# Patient Record
Sex: Female | Born: 1985 | Hispanic: Yes | Marital: Married | Smoking: Never smoker
Health system: Southern US, Community
[De-identification: ages and names within clinical notes are randomized; demographics above are authoritative.]

## PROBLEM LIST (undated history)

## (undated) DIAGNOSIS — R51 Headache: Secondary | ICD-10-CM

## (undated) DIAGNOSIS — R519 Headache, unspecified: Secondary | ICD-10-CM

## (undated) HISTORY — DX: Headache, unspecified: R51.9

## (undated) HISTORY — PX: KNEE SURGERY: SHX244

## (undated) HISTORY — PX: APPENDECTOMY: SHX54

## (undated) HISTORY — DX: Headache: R51

---
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
## 2011-04-13 ENCOUNTER — Ambulatory Visit: Payer: Self-pay | Admitting: Internal Medicine

## 2015-03-31 ENCOUNTER — Emergency Department: Payer: BLUE CROSS/BLUE SHIELD

## 2015-03-31 ENCOUNTER — Encounter: Payer: Self-pay | Admitting: Emergency Medicine

## 2015-03-31 ENCOUNTER — Emergency Department
Admission: EM | Admit: 2015-03-31 | Discharge: 2015-03-31 | Disposition: A | Discharge status: Home / Self Care | Payer: BLUE CROSS/BLUE SHIELD | Attending: Emergency Medicine | Admitting: Emergency Medicine

## 2015-03-31 DIAGNOSIS — M791 Myalgia: Secondary | ICD-10-CM | POA: Insufficient documentation

## 2015-03-31 DIAGNOSIS — M545 Low back pain, unspecified: Secondary | ICD-10-CM

## 2015-03-31 MED ORDER — IBUPROFEN 800 MG PO TABS: 800.0000 mg | ORAL_TABLET | Freq: Three times a day (TID) | ORAL | Status: DC | PRN

## 2015-03-31 MED ORDER — METHOCARBAMOL 500 MG PO TABS
500.0000 mg | ORAL_TABLET | Freq: Once | ORAL | Status: AC
  Administered 2015-03-31: 500 mg via ORAL

## 2015-03-31 MED ORDER — METHOCARBAMOL 500 MG PO TABS
ORAL_TABLET | ORAL | Status: AC
  Administered 2015-03-31: 500 mg via ORAL
  Filled 2015-03-31: qty 1

## 2015-03-31 MED ORDER — OXYCODONE-ACETAMINOPHEN 5-325 MG PO TABS: 2.0000 | ORAL_TABLET | Freq: Once | ORAL | Status: DC

## 2015-03-31 MED ORDER — CYCLOBENZAPRINE HCL 10 MG PO TABS: 10.0000 mg | ORAL_TABLET | Freq: Three times a day (TID) | ORAL | Status: DC | PRN

## 2015-03-31 MED ORDER — KETOROLAC TROMETHAMINE 60 MG/2ML IM SOLN
INTRAMUSCULAR | Status: AC
  Administered 2015-03-31: 60 mg
  Filled 2015-03-31: qty 2

## 2015-03-31 MED ORDER — HYDROCODONE-ACETAMINOPHEN 5-325 MG PO TABS: 1.0000 | ORAL_TABLET | ORAL | Status: DC | PRN

## 2015-03-31 MED ORDER — KETOROLAC TROMETHAMINE 30 MG/ML IJ SOLN: 60.0000 mg | Freq: Once | INTRAMUSCULAR | Status: DC

## 2015-03-31 NOTE — ED Notes (Signed)
Pt presents with back pain for more than one year. [facility]. Pt reports pain has been constant since this past Friday.

## 2015-03-31 NOTE — ED Provider Notes (Signed)
[facility] Emergency Department Provider Note  ____________________________________________  Time seen: Approximately 2:13 PM  I have reviewed the triage vital signs and the nursing notes.   HISTORY  Chief Complaint Back Pain    HPI Andrea Ellis is a 29 y.o. female presents with low back pain for more than a year. However she had acute exacerbation last night with increased pain to the right side of her buttocks and lumbar spine.   History reviewed. No pertinent past medical history.  There are no active problems to display for this patient.   Past Surgical History  Procedure Laterality Date  . Knee surgery      Current Outpatient Rx  Name  Route  Sig  Dispense  Refill  . cyclobenzaprine (FLEXERIL) 10 MG tablet   Oral   Take 1 tablet (10 mg total) by mouth every 8 (eight) hours as needed for muscle spasms.   30 tablet   1   . HYDROcodone-acetaminophen (NORCO) 5-325 MG per tablet   Oral   Take 1-2 tablets by mouth every 4 (four) hours as needed for moderate pain.   15 tablet   0   . ibuprofen (ADVIL,MOTRIN) 800 MG tablet   Oral   Take 1 tablet (800 mg total) by mouth every 8 (eight) hours as needed.   30 tablet   0     Allergies Review of patient's allergies indicates no known allergies.  No family history on file.  Social History History  Substance Use Topics  . Smoking status: Never Smoker   . Smokeless tobacco: Not on file  . Alcohol Use: No    Review of Systems Constitutional: No fever/chills Eyes: No visual changes. ENT: No sore throat. Cardiovascular: Denies chest pain. Respiratory: Denies shortness of breath. Gastrointestinal: No abdominal pain.  No nausea, no vomiting.  No diarrhea.  No constipation. Genitourinary: Negative for dysuria. Musculoskeletal: Positive for lumbar pain. Skin: Negative for rash. Neurological: Negative for headaches, focal weakness or numbness.  10-point ROS otherwise  negative.  ____________________________________________   PHYSICAL EXAM:  VITAL SIGNS: ED Triage Vitals  Enc Vitals Group     BP 03/31/15 1302 128/95 mmHg     Pulse Rate 03/31/15 1302 79     Resp 03/31/15 1302 18     Temp 03/31/15 1302 98.2 F (36.8 C)     Temp Source 03/31/15 1302 Oral     SpO2 03/31/15 1302 100 %     Weight 03/31/15 1302 192 lb (87.091 kg)     Height 03/31/15 1302 5\' 3"  (1.6 m)     Head Cir --      Peak Flow --      Pain Score 03/31/15 1303 8     Pain Loc --      Pain Edu? --      Excl. in GC? --     Constitutional: Alert and oriented. Well appearing and in no acute distress. Eyes: Conjunctivae are normal. PERRL. EOMI. Head: Atraumatic. Nose: No congestion/rhinnorhea. Mouth/Throat: Mucous membranes are moist.  Oropharynx non-erythematous. Neck: No stridor.   Cardiovascular: Normal rate, regular rhythm. Grossly normal heart sounds.  Good peripheral circulation. Respiratory: Normal respiratory effort.  No retractions. Lungs CTAB. Gastrointestinal: Soft and nontender. No distention. No abdominal bruits. No CVA tenderness. Musculoskeletal: No lower extremity tenderness nor edema.  No joint effusions. Straight leg raise negative bilaterally Neurologic:  Normal speech and language. No gross focal neurologic deficits are appreciated. Speech is normal. No gait instability. Skin:  Skin  is warm, dry and intact. No rash noted. Psychiatric: Mood and affect are normal. Speech and behavior are normal.  ____________________________________________   LABS (all labs ordered are listed, but only abnormal results are displayed)  Labs Reviewed - No data to display ____________________________________________  EKG  Deferred ____________________________________________  RADIOLOGY  Interpreted by radiologist, reviewed by myself. Negative. ____________________________________________   PROCEDURES  Procedure(s) performed: None  Critical Care performed:  No  ____________________________________________   INITIAL IMPRESSION / ASSESSMENT AND PLAN / ED COURSE  Pertinent labs & imaging results that were available during my care of the patient were reviewed by me and considered in my medical decision making (see chart for details).  Chronic low back pain. We'll refer to local PCP. Rx given for Flexeril Motrin and 2 day course of hydrocodone. ____________________________________________   FINAL CLINICAL IMPRESSION(S) / ED DIAGNOSES  Final diagnoses:  Bilateral low back pain without sciatica      Evangeline Dakin, PA-C 03/31/15 1626  Jene Every, MD 04/04/15 1539

## 2015-03-31 NOTE — Discharge Instructions (Signed)
Dolor De Espalda Crnico (Chronic Back Pain)  Cuando el dolor en la espalda dura ms de 3 meses, se denomina dolor de espalda crnico. Las personas que sufren dolor de espalda crnico generalmente pasan por perodos en los que es ms intenso (brotes).  CAUSAS  El dolor de espalda crnico puede estar originado en el desgaste degeneracin) de las diferentes estructuras de la espalda. Estas estructuras incluyen:  Los huesos de la columna vertebral (vrtebras) y las articulaciones rodean la mdula espinal y las races nerviosas (facetas).  Hay un tejido fibroso y fuerte que conecta las vrtebras (ligamentos). La degeneracin de estas estructuras provoca presin Jones Apparel Groupsobre los nervios. Esto puede causar Engineer, manufacturing systemsun dolor permanente.  INSTRUCCIONES PARA EL CUIDADO EN EL HOGAR   Evite encorvarse, levantar mucho peso, permanecer sentado por Con-waymucho tiempo, y las actividades que puedan empeorar el problema.  Tome breves perodos de descanso a travs del da para reducir Chief Technology Officerel dolor durante los brotes. Recostarse o Personal assistantpermanecer de pie generalmente es mejor que permanecer sentado para Lawyerdescansar.  Tome slo medicamentos de venta libre o recetados, segn las indicaciones del mdico. SOLICITE ATENCIN MDICA DE INMEDIATO SI:  Siente debilidad intensa o adormecimiento en una de sus piernas o pies.  Tiene dificultad para controlar la vejiga o el intestino.  Presenta nuseas, vmitos, dolor abdominal, falta de aire o desmayos. Document Released: 10/17/2005 Document Revised: 01/09/2012 [facility] Patient Information 2015 [place], [place]. This information is not intended to replace advice given to you by your health care provider. Make sure you discuss any questions you have with your health care provider.

## 2015-04-28 ENCOUNTER — Ambulatory Visit (INDEPENDENT_AMBULATORY_CARE_PROVIDER_SITE_OTHER): Payer: BLUE CROSS/BLUE SHIELD | Admitting: Family Medicine

## 2015-04-28 ENCOUNTER — Encounter: Payer: Self-pay | Admitting: Family Medicine

## 2015-04-28 VITALS — BP 115/77 | HR 62 | Temp 98.2°F | Ht 63.5 in | Wt 189.6 lb

## 2015-04-28 DIAGNOSIS — M5441 Lumbago with sciatica, right side: Secondary | ICD-10-CM

## 2015-04-28 DIAGNOSIS — M545 Low back pain: Secondary | ICD-10-CM | POA: Insufficient documentation

## 2015-04-28 DIAGNOSIS — G8929 Other chronic pain: Secondary | ICD-10-CM | POA: Insufficient documentation

## 2015-04-28 NOTE — Assessment & Plan Note (Signed)
Discussed results of the x-ray with patient. No sign of DJD. Neuro exam is normal. No need for MRI at this time. Has plenty of muscle relaxer at home. Will take it at night. Will continue NSAID. Will start PT. Referral to [facility] generated today and given to patient who will call for an appointment. Exercises and information about low back pain given to patient today. Follow up in 2 weeks to see how she is doing.

## 2015-04-28 NOTE — Patient Instructions (Signed)
Back Pain, Adult Back pain is very common. The pain often gets better over time. The cause of back pain is usually not dangerous. Most people can learn to manage their back pain on their own.  HOME CARE   Stay active. Start with short walks on flat ground if you can. Try to walk farther each day.  Do not sit, drive, or stand in one place for more than 30 minutes. Do not stay in bed.  Do not avoid exercise or work. Activity can help your back heal faster.  Be careful when you bend or lift an object. Bend at your knees, keep the object close to you, and do not twist.  Sleep on a firm mattress. Lie on your side, and bend your knees. If you lie on your back, put a pillow under your knees.  Only take medicines as told by your doctor.  Put ice on the injured area.  Put ice in a plastic bag.  Place a towel between your skin and the bag.  Leave the ice on for 15-20 minutes, 03-04 times a day for the first 2 to 3 days. After that, you can switch between ice and heat packs.  Ask your doctor about back exercises or massage.  Avoid feeling anxious or stressed. Find good ways to deal with stress, such as exercise. GET HELP RIGHT AWAY IF:   Your pain does not go away with rest or medicine.  Your pain does not go away in 1 week.  You have new problems.  You do not feel well.  The pain spreads into your legs.  You cannot control when you poop (bowel movement) or pee (urinate).  Your arms or legs feel weak or lose feeling (numbness).  You feel sick to your stomach (nauseous) or throw up (vomit).  You have belly (abdominal) pain.  You feel like you may pass out (faint). MAKE SURE YOU:   Understand these instructions.  Will watch your condition.  Will get help right away if you are not doing well or get worse. Document Released: 04/04/2008 Document Revised: 01/09/2012 Document Reviewed: 02/18/2014 [facility] Patient Information 2015 [place], [place]. This information is not intended  to replace advice given to you by your health care provider. Make sure you discuss any questions you have with your health care provider. Back Exercises Back exercises help treat and prevent back injuries. The goal is to increase your strength in your belly (abdominal) and back muscles. These exercises can also help with flexibility. Start these exercises when told by your doctor. HOME CARE Back exercises include: Pelvic Tilt.  Lie on your back with your knees bent. Tilt your pelvis until the lower part of your back is against the floor. Hold this position 5 to 10 sec. Repeat this exercise 5 to 10 times. Knee to Chest.  Pull 1 knee up against your chest and hold for 20 to 30 seconds. Repeat this with the other knee. This may be done with the other leg straight or bent, whichever feels better. Then, pull both knees up against your chest. Sit-Ups or Curl-Ups.  Bend your knees 90 degrees. Start with tilting your pelvis, and do a partial, slow sit-up. Only lift your upper half 30 to 45 degrees off the floor. Take at least 2 to 3 seonds for each sit-up. Do not do sit-ups with your knees out straight. If partial sit-ups are difficult, simply do the above but with only tightening your belly (abdominal) muscles and holding it as told.  Hip-Lift.  Lie on your back with your knees flexed 90 degrees. Push down with your feet and shoulders as you raise your hips 2 inches off the floor. Hold for 10 seconds, repeat 5 to 10 times. Back Arches.  Lie on your stomach. Prop yourself up on bent elbows. Slowly press on your hands, causing an arch in your low back. Repeat 3 to 5 times. Shoulder-Lifts.  Lie face down with arms beside your body. Keep hips and belly pressed to floor as you slowly lift your head and shoulders off the floor. Do not overdo your exercises. Be careful in the beginning. Exercises may cause you some mild back discomfort. If the pain lasts for more than 15 minutes, stop the exercises until you  see your doctor. Improvement with exercise for back problems is slow.  Document Released: 11/19/2010 Document Revised: 01/09/2012 Document Reviewed: 08/18/2011 [facility] Patient Information 2015 [place], [place]. This information is not intended to replace advice given to you by your health care provider. Make sure you discuss any questions you have with your health care provider. Back Exercises Back exercises help treat and prevent back injuries. The goal is to increase your strength in your belly (abdominal) and back muscles. These exercises can also help with flexibility. Start these exercises when told by your doctor. HOME CARE Back exercises include: Pelvic Tilt.  Lie on your back with your knees bent. Tilt your pelvis until the lower part of your back is against the floor. Hold this position 5 to 10 sec. Repeat this exercise 5 to 10 times. Knee to Chest.  Pull 1 knee up against your chest and hold for 20 to 30 seconds. Repeat this with the other knee. This may be done with the other leg straight or bent, whichever feels better. Then, pull both knees up against your chest. Sit-Ups or Curl-Ups.  Bend your knees 90 degrees. Start with tilting your pelvis, and do a partial, slow sit-up. Only lift your upper half 30 to 45 degrees off the floor. Take at least 2 to 3 seonds for each sit-up. Do not do sit-ups with your knees out straight. If partial sit-ups are difficult, simply do the above but with only tightening your belly (abdominal) muscles and holding it as told. Hip-Lift.  Lie on your back with your knees flexed 90 degrees. Push down with your feet and shoulders as you raise your hips 2 inches off the floor. Hold for 10 seconds, repeat 5 to 10 times. Back Arches.  Lie on your stomach. Prop yourself up on bent elbows. Slowly press on your hands, causing an arch in your low back. Repeat 3 to 5 times. Shoulder-Lifts.  Lie face down with arms beside your body. Keep hips and belly pressed to  floor as you slowly lift your head and shoulders off the floor. Do not overdo your exercises. Be careful in the beginning. Exercises may cause you some mild back discomfort. If the pain lasts for more than 15 minutes, stop the exercises until you see your doctor. Improvement with exercise for back problems is slow.  Document Released: 11/19/2010 Document Revised: 01/09/2012 Document Reviewed: 08/18/2011 [facility] Patient Information 2015 [place], [place]. This information is not intended to replace advice given to you by your health care provider. Make sure you discuss any questions you have with your health care provider.

## 2015-04-28 NOTE — Progress Notes (Signed)
BP 115/77 mmHg  Pulse 62  Temp(Src) 98.2 F (36.8 C) (Oral)  Ht 5' 3.5" (1.613 m)  Wt 189 lb 9.6 oz (86.002 kg)  BMI 33.06 kg/m2  SpO2 99%  LMP 04/07/2015   Subjective:    Patient ID: Andrea Ellis, female    DOB: [birth year], 29 y.o.   MRN: [id number]  HPI: Andrea Ellis is a 29 y.o. female here today to establish care.   Chief Complaint  Patient presents with  . Back Pain    Need a MRI per ER. Need primary care doctor. Pain in right side lower back and leg pain   BACK PAIN- Has seen [facility] previously for her back but the pain started to get really bad, so about a month ago she went to the ER. 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 She has never done any PT in the past and her exercises are all self-taught.  Duration: 2 years Mechanism of injury: no trauma Location: Right and low back Onset: gradual Severity: 6/10 Quality: sharp and shooting Frequency: intermittent Radiation: R leg below the knee Aggravating factors: bending Alleviating factors: nothing Status: stable Treatments attempted: rest, ice, heat, APAP, ibuprofen, aleve and HEP  Relief with NSAIDs?: mild Nighttime pain:  yes Paresthesias / decreased sensation:  no Bowel / bladder incontinence:  no Fevers:  no Dysuria / urinary frequency:  no  Relevant past medical, surgical, family and social history reviewed and updated as indicated. Interim medical history since our last visit reviewed. Allergies and medications reviewed and updated.  Review of Systems  Constitutional: Negative.   Respiratory: Negative.   Cardiovascular: Negative.   Gastrointestinal: Negative.   Musculoskeletal: Positive for myalgias and back pain. Negative for arthralgias, gait problem, neck pain and neck stiffness.  Psychiatric/Behavioral: Negative.    Per HPI unless specifically indicated above     Objective:    BP 115/77 mmHg  Pulse 62  Temp(Src) 98.2 F  (36.8 C) (Oral)  Ht 5' 3.5" (1.613 m)  Wt 189 lb 9.6 oz (86.002 kg)  BMI 33.06 kg/m2  SpO2 99%  LMP 04/07/2015  Wt Readings from Last 3 Encounters:  04/28/15 189 lb 9.6 oz (86.002 kg)  03/31/15 192 lb (87.091 kg)    Physical Exam  Constitutional: She is oriented to person, place, and time. She appears well-developed and well-nourished. No distress.  HENT:  Head: Normocephalic and atraumatic.  Right Ear: Hearing normal.  Left Ear: Hearing normal.  Nose: Nose normal.  Eyes: Conjunctivae and lids are normal. Right eye exhibits no discharge. Left eye exhibits no discharge. No scleral icterus.  Cardiovascular: Normal rate, regular rhythm, normal heart sounds and intact distal pulses.  Exam reveals no gallop and no friction rub.   No murmur heard. Pulmonary/Chest: Effort normal and breath sounds normal. No respiratory distress. She has no wheezes. She has no rales. She exhibits no tenderness.  Abdominal: Soft.  Neurological: She is alert and oriented to person, place, and time. She displays normal reflexes. She exhibits normal muscle tone. Coordination normal.  Skin: Skin is warm, dry and intact. No rash noted. No erythema. No pallor.  Psychiatric: She has a normal mood and affect. Her speech is normal and behavior is normal. Judgment and thought content normal. Cognition and memory are normal.    Back Exam:    Inspection:  Normal spinal curvature.  No deformity, ecchymosis, erythema, or lesions hypertonic paraspinals on the R L1-5  Palpation:     Midline spinal tenderness: no      Paralumbar tenderness: yesRight     Parathoracic tenderness: yesRight     Buttocks tenderness: yesRight     Range of Motion:      Flexion: Fingers to Knees     Extension:Decreased     Lateral bending:Decreased    Rotation:Decreased    Neuro Exam:Lower extremity DTRs normal & symmetric.  Strength and sensation intact.    Special Tests:      Straight leg raise:negative     Assessment & Plan:    Problem List Items Addressed This Visit    None       Follow up plan: No Follow-up on file.

## 2015-05-12 ENCOUNTER — Ambulatory Visit (INDEPENDENT_AMBULATORY_CARE_PROVIDER_SITE_OTHER): Payer: BLUE CROSS/BLUE SHIELD | Admitting: Family Medicine

## 2015-05-12 ENCOUNTER — Encounter: Payer: Self-pay | Admitting: Family Medicine

## 2015-05-12 VITALS — BP 110/75 | HR 61 | Temp 98.5°F | Wt 195.7 lb

## 2015-05-12 DIAGNOSIS — M5441 Lumbago with sciatica, right side: Secondary | ICD-10-CM | POA: Diagnosis not present

## 2015-05-12 MED ORDER — CYCLOBENZAPRINE HCL 10 MG PO TABS: 10.0000 mg | ORAL_TABLET | Freq: Three times a day (TID) | ORAL | Status: DC | PRN

## 2015-05-12 NOTE — Progress Notes (Signed)
BP 110/75 mmHg  Pulse 61  Temp(Src) 98.5 F (36.9 C)  Wt 195 lb 11.2 oz (88.769 kg)  SpO2 99%  LMP 04/07/2015 (Approximate)   Subjective:    Patient ID: Andrea Ellis, female    DOB: [birth year], 29 y.o.   MRN: [id number]  HPI: Andrea Ellis is a 29 y.o. female  Chief Complaint  Patient presents with  . Back Pain    patinet has went to Physical therapy 3 times, she states that it has not helped at all. She would like to know if she needs to continue going there or not   BACK PAIN Duration: 2 years Mechanism of injury: no trauma Location: Right and low back Onset: gradual Severity: moderate Quality: aching and tender Frequency: constant Radiation: none Aggravating factors: lifting, movement, walking, laying and bending Alleviating factors: nothing Status: stable Treatments attempted: rest, ice, heat, APAP, ibuprofen, aleve, physical therapy and HEP  Relief with NSAIDs?: moderate Nighttime pain:  no Paresthesias / decreased sensation:  no Bowel / bladder incontinence:  no Fevers:  no Dysuria / urinary frequency:  no  Relevant past medical, surgical, family and social history reviewed and updated as indicated. Interim medical history since our last visit reviewed. Allergies and medications reviewed and updated.  Review of Systems  Constitutional: Negative.   Respiratory: Negative.   Cardiovascular: Negative.   Gastrointestinal: Negative.   Genitourinary: Negative.   Musculoskeletal: Negative.   Psychiatric/Behavioral: Negative.     Per HPI unless specifically indicated above     Objective:    BP 110/75 mmHg  Pulse 61  Temp(Src) 98.5 F (36.9 C)  Wt 195 lb 11.2 oz (88.769 kg)  SpO2 99%  LMP 04/07/2015 (Approximate)  Wt Readings from Last 3 Encounters:  05/12/15 195 lb 11.2 oz (88.769 kg)  04/28/15 189 lb 9.6 oz (86.002 kg)  03/31/15 192 lb (87.091 kg)    Physical Exam  Constitutional: She is oriented to person, place, and time. She appears  well-developed and well-nourished. No distress.  HENT:  Head: Normocephalic and atraumatic.  Right Ear: Hearing normal.  Left Ear: Hearing normal.  Nose: Nose normal.  Eyes: Conjunctivae and lids are normal. Right eye exhibits no discharge. Left eye exhibits no discharge. No scleral icterus.  Pulmonary/Chest: Effort normal. No respiratory distress.  Abdominal: Soft. Bowel sounds are normal. She exhibits no distension and no mass. There is no tenderness. There is no rebound and no guarding.  Neurological: She is alert and oriented to person, place, and time.  Skin: Skin is intact. No rash noted.  Psychiatric: She has a normal mood and affect. Her speech is normal and behavior is normal. Judgment and thought content normal. Cognition and memory are normal.  Nursing note and vitals reviewed. Back Exam:    Inspection:  Normal spinal curvature.  No deformity, ecchymosis, erythema, or lesions, paralumbar muscle spasm on the L     Palpation:     Midline spinal tenderness: no      Paralumbar tenderness: yesLeft     Parathoracic tenderness: no     Buttocks tenderness: yesLeft     Range of Motion:      Flexion: Fingers to Knees     Extension:Decreased     Lateral bending:Decreased    Rotation:Decreased    Neuro Exam:Lower extremity DTRs normal & symmetric.  Strength and sensation intact.    Special Tests:      Straight leg raise:negative   No results found for this or any previous visit.  Assessment & Plan:   Problem List Items Addressed This Visit      Other   Low back pain - Primary    Has done 2 sessions of PT and an evaluation. Not better. Advised her that as her pain has been going on for 2 years, it will take time to get those muscles to relax and for her to feel better. Take flexeril at night. Advised her to get Dr. Margart Sickles insoles. Continue to monitor. Call with any concerns. Recheck in 3 weeks.       Relevant Medications   cyclobenzaprine (FLEXERIL) 10 MG tablet        Follow up plan: Return in about 3 weeks (around 06/02/2015).

## 2015-05-12 NOTE — Assessment & Plan Note (Signed)
Has done 2 sessions of PT and an evaluation. Not better. Advised her that as her pain has been going on for 2 years, it will take time to get those muscles to relax and for her to feel better. Take flexeril at night. Advised her to get Dr. Margart SicklesScholl's insoles. Continue to monitor. Call with any concerns. Recheck in 3 weeks.

## 2015-05-12 NOTE — Patient Instructions (Addendum)
Dr. Felicie Morn in soles (the ones you stand on to get) Get the coupon online to be cheaper. Prevencin de las lesiones en la espalda (Back Injury Prevention) Las lesiones en la espalda pueden ser muy dolorosas y son difciles de curar. Despus de tener una lesin en la espalda es probable que sufra otra en el futuro. Es importante aprender a Air traffic controller o a Location manager a Engineer, maintenance (IT). Los siguientes consejos pueden ayudar a prevenir una lesin de espalda.  APTITUD FSICA   Practique actividad fsica con frecuencia y trate de lograr un buen tono en los msculos abdominales. Los msculos del abdomen nos proporcionan el soporte necesario para la espalda.  Haga ejercicios aerbicos (caminar, trotar, andar en bicicleta, nadar) con frecuencia.  Haga ejercicios que aumenten el equilibrio y la fuerza (tai chi, yoga) con frecuencia. Esto puede disminuir el riesgo de caerse y Building surveyor espalda.  Elongue antes y despus de hacer [place].  Mantenga un peso saludable. Cuanto ms pese, ms tensin se pone en la espalda. Por cada libra de [place], es 10 veces mayor la cantidad de presin que se coloca en la parte posterior. DIETA   Consulte con su mdico la cantidad de calcio y vitamina D que necesita por da Estos nutrientes ayudan a prevenir el debilitamiento de los huesos (osteoporosis). La osteoporosis puede quebrar los huesos (fractura) que hacen doler la espalda.  Agregue una buena fuente de calcio en la dieta, como productos lcteos, vegetales de hojas verdes y productos con calcio agregado (fortificados).  Agregue una buena fuente de vitamina D en su dieta, como la [place] y alimentos que estn fortificados con vitamina D.  Consulte por un suplemento nutricional o un multivitamnico, si es necesario.  Si fuma, abandone el hbito. POSTURA   Sintese y pngase de pie en posicin recta. Evite inclinarse hacia adelante al sentarse o encorvarse mientras est de pie.  Elija sillas con  buen apoyo para la espalda (lumbar).  Si trabaja en un escritorio, sintese cerca del mismo, de modo que no deba inclinarse hacia adelante. Mantenga el [employer]. El cuello debe estar hacia atrs y los codos doblados en ngulo recto. Los brazos deben formar la letra "L".  Sintese derecho y cerca del volante cuando conduzca su automvil. Coloque un soporte lumbar en el asiento de su automvil, si lo necesita.  Evite permanecer sentado o de pie en la misma posicin por [employer]. Descanse, levntese, estrese y camine al menos una vez cada hora. Tome descansos si conduce el automvil por [employer].  Duerma sobre un costado con las rodillas ligeramente dobladas o sobre la espalda con una almohada debajo de las rodillas. No duerma sobre el abdomen. LEVANTAR, DOBLARSE Y ALCANZAR   Evite levantar objetos pesados, especialmente si debe repetir los movimientos. Si tiene que levantar un objeto pesado:  Haga elongaciones antes de Lexicographer un objeto.  Trabaje lentamente.  Descanse entre uno y otro esfuerzo.  Use carretas y carretillas para mover objetos siempre que pueda.  Haga varios viajes pequeos en vez de llevar una carga pesada.  Pida ayuda cuando la necesite.  Pida ayuda al mover objetos grandes y que sean difciles de [place].  Siga estos pasos al levantar objetos:  Prese con los pies separados a la misma distancia que el ancho de los hombros.  Mantngase lo ms cerca que pueda del [place]. No intente levantar objetos pesados que se encuentren lejos de su cuerpo.  Use los mangos o las correas de elevacin, si estn  disponibles.  Doble las rodillas. Pngase de cuclillas, pero mantenga los [employer].  Mantenga los hombros hacia atrs, el mentn pegado, y la espalda recta.  Levante el objeto lentamente, tensando los msculos de las piernas, el abdomen y las nalgas. Mantenga el objeto lo ms cerca del centro de su cuerpo como sea posible.  Cuando baje la  carga, use las [employer], pero al revs.  NO:  Levante el objeto por arriba de la cintura.  Gire la cintura mientras levanta o sostiene una carga pesada. Si necesita dar vuelta mueva sus pies, no su cintura.  Inclnese hacia adelante sin flexionar las rodillas.  Evite levantar los objetos por arriba de su cabeza, por encima de una mesa o aquellos objetos que se encuentren sobre una superficie elevada. OTROS CONSEJOS   Evite pisar [employer] mojados y [place] las aceras libres de hielo para evitar cadas.  No duerma sobre un colchn muy blando o muy duro.  Mantenga a su alcance los artculos que utiliza con frecuencia.  Coloque los objetos pesados en estantes a nivel de la cintura y los objetos ms livianos en estantes ms altos o ms bajos.  Encuentre formas para reducir Dealer, como el ejercicio, los masajes o las tcnicas de Systems developer. El estrs puede [employer]. Los msculos tensionados son ms vulnerables a las lesiones.  Busque tratamientos para la depresin o la ansiedad si lo necesita. Estos trastornos pueden aumentar el riesgo de Hydrologist de [place]. SOLICITE ATENCIN MDICA SI:   Se lesiona la espalda.  Tiene preguntas sobre la dieta, el ejercicio u otras formas de prevenir las lesiones en la espalda. ASEGRESE DE QUE:   Comprende estas instrucciones.  Controlar su enfermedad.  Solicitar ayuda de inmediato si no mejora o si empeora. Document Released: 10/17/2005 Document Revised: 01/09/2012 [facility] Patient Information 2015 [place]. This information is not intended to replace advice given to you by your health care provider. Make sure you discuss any questions you have with your health care provider.

## 2015-06-02 ENCOUNTER — Ambulatory Visit: Payer: BLUE CROSS/BLUE SHIELD | Admitting: Family Medicine

## 2015-09-04 ENCOUNTER — Ambulatory Visit (INDEPENDENT_AMBULATORY_CARE_PROVIDER_SITE_OTHER): Payer: Managed Care, Other (non HMO) | Admitting: Family Medicine

## 2015-09-04 ENCOUNTER — Encounter: Payer: Self-pay | Admitting: Family Medicine

## 2015-09-04 VITALS — BP 130/84 | HR 80 | Temp 98.7°F | Ht 62.6 in | Wt 200.0 lb

## 2015-09-04 DIAGNOSIS — N912 Amenorrhea, unspecified: Secondary | ICD-10-CM | POA: Diagnosis not present

## 2015-09-04 DIAGNOSIS — Z331 Pregnant state, incidental: Secondary | ICD-10-CM

## 2015-09-04 DIAGNOSIS — Z3490 Encounter for supervision of normal pregnancy, unspecified, unspecified trimester: Secondary | ICD-10-CM

## 2015-09-04 LAB — PREGNANCY, URINE: Preg Test, Ur: POSITIVE — AB

## 2015-09-04 MED ORDER — PRENATAL VITAMINS PLUS 27-1 MG PO TABS: 1.0000 | ORAL_TABLET | Freq: Every day | ORAL | Status: DC

## 2015-09-04 NOTE — Progress Notes (Signed)
BP 130/84 mmHg  Pulse 80  Temp(Src) 98.7 F (37.1 C)  Ht 5' 2.6" (1.59 m)  Wt 200 lb (90.719 kg)  BMI 35.88 kg/m2  SpO2 100%  LMP  (LMP Unknown)   Subjective:    Patient ID: Andrea Ellis Sliter, female    DOB: [birth year], 29 y.o.   MRN: [id number]  HPI: Andrea Ellis Buday is a 29 y.o. female  Chief Complaint  Patient presents with  . Amenorrhea    Patient states that she has irregular periods, she does not know when her last one was, she has taken two home test that were both positive.   PREGNANCY DIAGNOSIS- last period 4 months ago, 2 weeks ago took pills for internal cleaning- stopped taking them when she had the positive pregnancy test, very irregular periods, not using contraception. Unclear how far along she is. She was hoping to become pregnant and is happy about this Gravida/Para:G1P0 Home pregnancy test: positive x2 Nausea: no Vomiting: yes Breast tenderness: no Abdominal pain: no Vaginal bleeding: no Pregnancy or labor complications: None Contraception: None  Relevant past medical, surgical, family and social history reviewed and updated as indicated. Interim medical history since our last visit reviewed. Allergies and medications reviewed and updated.  Review of Systems  Constitutional: Negative.   Respiratory: Negative.   Cardiovascular: Negative.   Gastrointestinal: Positive for vomiting. Negative for nausea, abdominal pain, diarrhea, constipation, blood in stool, abdominal distention, anal bleeding and rectal pain.  Genitourinary: Negative.   Psychiatric/Behavioral: Negative.     Per HPI unless specifically indicated above     Objective:    BP 130/84 mmHg  Pulse 80  Temp(Src) 98.7 F (37.1 C)  Ht 5' 2.6" (1.59 m)  Wt 200 lb (90.719 kg)  BMI 35.88 kg/m2  SpO2 100%  LMP  (LMP Unknown)  Wt Readings from Last 3 Encounters:  09/04/15 200 lb (90.719 kg)  05/12/15 195 lb 11.2 oz (88.769 kg)  04/28/15 189 lb 9.6 oz (86.002 kg)    Physical Exam   Constitutional: She is oriented to person, place, and time. She appears well-developed and well-nourished. No distress.  HENT:  Head: Normocephalic and atraumatic.  Right Ear: Hearing normal.  Left Ear: Hearing normal.  Nose: Nose normal.  Eyes: Conjunctivae, EOM and lids are normal. Pupils are equal, round, and reactive to light. Right eye exhibits no discharge. Left eye exhibits no discharge. No scleral icterus.  Pulmonary/Chest: Effort normal. No respiratory distress.  Musculoskeletal: Normal range of motion.  Neurological: She is alert and oriented to person, place, and time.  Skin: Skin is warm, dry and intact. No rash noted. She is not diaphoretic. No erythema. No pallor.  Psychiatric: She has a normal mood and affect. Her speech is normal and behavior is normal. Judgment and thought content normal. Cognition and memory are normal.  Nursing note and vitals reviewed.  Results for orders placed or performed in visit on 09/04/15  Pregnancy, urine  Result Value Ref Range   Preg Test, Ur Positive (A) Negative      Assessment & Plan:   Problem List Items Addressed This Visit    None    Visit Diagnoses    Pregnancy    -  Primary    Newly diagnosed 1st pregnancy. Prenatal vitamins and information given. Referral to GYN made. Monitor. Call with any concerns.     Relevant Orders    Ambulatory referral to Gynecology    Amenorrhea        Will check urine pregnancy.  Relevant Orders    Pregnancy, urine (Completed)        Follow up plan: Return if symptoms worsen or fail to improve.

## 2015-09-04 NOTE — Patient Instructions (Signed)
Primer trimestre de embarazo  (First Trimester of Pregnancy)  El primer trimestre de embarazo se extiende desde la semana 1 hasta el final de la semana 12 (mes 1 al mes 3). Una semana después de que un espermatozoide fecunda un óvulo, este se implantará en la pared uterina. Este embrión comenzará a desarrollarse hasta convertirse en un bebé. Sus genes y los de su pareja forman el bebé. Los genes del varón determinan si será un niño o una niña. Entre la semana 6 y la 8, se forman los ojos y el rostro, y los latidos del corazón pueden verse en la ecografía. Al final de las 12 semanas, todos los órganos del bebé están formados.   Ahora que está embarazada, querrá hacer todo lo que esté a su alcance para tener un bebé sano. Dos de las cosas más importantes son tener una buena atención prenatal y seguir las indicaciones del médico. La atención prenatal incluye toda la asistencia médica que usted recibe antes del nacimiento del bebé. Esta ayudará a prevenir, detectar y tratar cualquier problema durante el embarazo y el parto.  CAMBIOS EN EL ORGANISMO  Su organismo atraviesa por muchos cambios durante el embarazo, y estos varían de una mujer a otra.   · Al principio, puede aumentar o bajar algunos kilos.  · Puede tener malestar estomacal (náuseas) y vomitar. Si no puede controlar los vómitos, llame al médico.  · Puede cansarse con facilidad.  · Es posible que tenga dolores de cabeza que pueden aliviarse con los medicamentos que el médico le permita tomar.  · Puede orinar con mayor frecuencia. El dolor al orinar puede significar que usted tiene una infección de la vejiga.  · Debido al embarazo, puede tener acidez estomacal.  · Puede estar estreñida, ya que ciertas hormonas enlentecen los movimientos de los músculos que empujan los desechos a través de los intestinos.  · Pueden aparecer hemorroides o abultarse e hincharse las venas (venas varicosas).  · Las mamas pueden empezar a agrandarse y estar sensibles. Los pezones  pueden sobresalir más, y el tejido que los rodea (areola) tornarse más oscuro.  · Las encías pueden sangrar y estar sensibles al cepillado y al hilo dental.  · Pueden aparecer zonas oscuras o manchas (cloasma, máscara del embarazo) en el rostro que probablemente se atenuarán después del nacimiento del bebé.  · Los períodos menstruales se interrumpirán.  · Tal vez no tenga apetito.  · Puede sentir un fuerte deseo de consumir ciertos alimentos.  · Puede tener cambios a nivel emocional día a día, por ejemplo, por momentos puede estar emocionada por el embarazo y por otros preocuparse porque algo pueda salir mal con el embarazo o el bebé.  · Tendrá sueños más vívidos y extraños.  · Tal vez haya cambios en el cabello que pueden incluir su engrosamiento, crecimiento rápido y cambios en la textura. A algunas mujeres también se les cae el cabello durante o después del embarazo, o tienen el cabello seco o fino. Lo más probable es que el cabello se le normalice después del nacimiento del bebé.  QUÉ DEBE ESPERAR EN LAS CONSULTAS PRENATALES  Durante una visita prenatal de rutina:  · La pesarán para asegurarse de que usted y el bebé están creciendo normalmente.  · Le controlarán la presión arterial.  · Le medirán el abdomen para controlar el desarrollo del bebé.  · Se escucharán los latidos cardíacos a partir de la semana 10 o la 12 de embarazo, aproximadamente.  · Se analizarán los resultados de los estudios solicitados en visitas anteriores.  El   médico puede preguntarle:  · Cómo se siente.  · Si siente los movimientos del bebé.  · Si ha tenido síntomas anormales, como pérdida de líquido, sangrado, dolores de cabeza intensos o cólicos abdominales.  · Si está consumiendo algún producto que contenga tabaco, como cigarrillos, tabaco de mascar y cigarrillos electrónicos.  · Si tiene alguna pregunta.  Otros estudios que pueden realizarse durante el primer trimestre incluyen lo siguiente:  · Análisis de sangre para determinar el tipo  de sangre y detectar la presencia de infecciones previas. Además, se los usará para controlar si los niveles de hierro son bajos (anemia) y determinar los anticuerpos Rh. En una etapa más avanzada del embarazo, se harán análisis de sangre para saber si tiene diabetes, junto con otros estudios si surgen problemas.  · Análisis de orina para detectar infecciones, diabetes o proteínas en la orina.  · Una ecografía para confirmar que el bebé crece y se desarrolla correctamente.  · Una amniocentesis para diagnosticar posibles problemas genéticos.  · Estudios del feto para descartar espina bífida y síndrome de Down.  · Es posible que necesite otras pruebas adicionales.  · Prueba del VIH (virus de inmunodeficiencia humana). Los exámenes prenatales de rutina incluyen la prueba de detección del VIH, a menos que decida no realizársela.  INSTRUCCIONES PARA EL CUIDADO EN EL HOGAR   Medicamentos:  · Siga las indicaciones del médico en relación con el uso de medicamentos. Durante el embarazo, hay medicamentos que pueden tomarse y otros que no.  · Tome las vitaminas prenatales como se le indicó.  · Si está estreñida, tome un laxante suave, si el médico lo autoriza.  Dieta  · Consuma alimentos balanceados. Elija alimentos variados, como carne o proteínas de origen vegetal, pescado, leche y productos lácteos descremados, verduras, frutas y panes y cereales integrales. El médico la ayudará a determinar la cantidad de peso que puede aumentar.  · No coma carne cruda ni quesos sin cocinar. Estos elementos contienen bacterias que pueden causar defectos congénitos en el bebé.  · La ingesta diaria de cuatro o cinco comidas pequeñas en lugar de tres comidas abundantes puede ayudar a aliviar las náuseas y los vómitos. Si empieza a tener náuseas, comer algunas galletas saladas puede ser de ayuda. Beber líquidos entre las comidas en lugar de tomarlos durante las comidas también puede ayudar a calmar las náuseas y los vómitos.  · Si está  estreñida, consuma alimentos con alto contenido de fibra, como verduras y frutas frescas, y cereales integrales. Beba suficiente líquido para mantener la orina clara o de color amarillo pálido.  Actividad y ejercicios  · Haga ejercicio solamente como se lo haya indicado el médico. El ejercicio la ayudará a:    Controlar el peso.    Mantenerse en forma.    Estar preparada para el trabajo de parto y el parto.  · Los dolores, los cólicos en la parte baja del abdomen o los calambres en la cintura son un buen indicio de que debe dejar de hacer ejercicios. Consulte al médico antes de seguir haciendo ejercicios normales.  · Intente no estar de pie durante mucho tiempo. Mueva las piernas con frecuencia si debe estar de pie en un lugar durante mucho tiempo.  · Evite levantar pesos excesivos.  · Use zapatos de tacones bajos y mantenga una buena postura.  · Puede seguir teniendo relaciones sexuales, excepto que el médico le indique lo contrario.  Alivio del dolor o las molestias  · Use un sostén que le brinde buen   soporte si siente dolor a la palpación en las mamas.  · Dese baños de asiento con agua tibia para aliviar el dolor o las molestias causadas por las hemorroides. Use crema antihemorroidal si el médico se lo permite.  · Descanse con las piernas elevadas si tiene calambres o dolor de cintura.  · Si tiene venas varicosas en las piernas, use medias de descanso. Eleve los pies durante 15 minutos, 3 o 4 veces por día. Limite la cantidad de sal en su dieta.  Cuidados prenatales  · Programe las visitas prenatales para la semana 12 de embarazo. Generalmente se programan cada mes al principio y se hacen más frecuentes en los 2 últimos meses antes del parto.  · Escriba sus preguntas. Llévelas cuando concurra a las visitas prenatales.  · Concurra a todas las visitas prenatales como se lo haya indicado el médico.  Seguridad  · Colóquese el cinturón de seguridad cuando conduzca.  · Haga una lista de los números de teléfono de  emergencia, que incluya los números de teléfono de familiares, amigos, el hospital y los departamentos de policía y bomberos.  Consejos generales  · Pídale al médico que la derive a clases de educación prenatal en su localidad. Debe comenzar a tomar las clases antes de entrar en el mes 6 de embarazo.  · Pida ayuda si tiene necesidades nutricionales o de asesoramiento durante el embarazo. El médico puede aconsejarla o derivarla a especialistas para que la ayuden con diferentes necesidades.  · No se dé baños de inmersión en agua caliente, baños turcos ni saunas.  · No se haga duchas vaginales ni use tampones o toallas higiénicas perfumadas.  · No mantenga las piernas cruzadas durante mucho tiempo.  · Evite el contacto con las bandejas sanitarias de los gatos y la tierra que estos animales usan. Estos elementos contienen bacterias que pueden causar defectos congénitos al bebé y la posible pérdida del feto debido a un aborto espontáneo o muerte fetal.  · No fume, no consuma hierbas ni medicamentos que no hayan sido recetados por el médico. Las sustancias químicas que estos productos contienen afectan la formación y el desarrollo del bebé.  · No consuma ningún producto que contenga tabaco, lo que incluye cigarrillos, tabaco de mascar y cigarrillos electrónicos. Si necesita ayuda para dejar de fumar, consulte al médico. Puede recibir asesoramiento y otro tipo de recursos para dejar de fumar.  · Programe una cita con el dentista. En su casa, lávese los dientes con un cepillo dental blando y pásese el hilo dental con suavidad.  SOLICITE ATENCIÓN MÉDICA SI:   · Tiene mareos.  · Siente cólicos leves, presión en la pelvis o dolor persistente en el abdomen.  · Tiene náuseas, vómitos o diarrea persistentes.  · Tiene secreción vaginal con mal olor.  · Siente dolor al orinar.  · Tiene el rostro, las manos, las piernas o los tobillos más hinchados.  SOLICITE ATENCIÓN MÉDICA DE INMEDIATO SI:   · Tiene fiebre.  · Tiene una pérdida de  líquido por la vagina.  · Tiene sangrado o pequeñas pérdidas vaginales.  · Siente dolor intenso o cólicos en el abdomen.  · Sube o baja de peso rápidamente.  · Vomita sangre de color rojo brillante o material que parezca granos de café.  · Ha estado expuesta a la rubéola y no ha sufrido la enfermedad.  · Ha estado expuesta a la quinta enfermedad o a la varicela.  · Tiene un dolor de cabeza intenso.  · Le falta el aire.  · Sufre   cualquier tipo de traumatismo, por ejemplo, debido a una caída o un accidente automovilístico.     Esta información no tiene como fin reemplazar el consejo del médico. Asegúrese de hacerle al médico cualquier pregunta que tenga.     Document Released: 07/27/2005 Document Revised: 11/07/2014  Elsevier Interactive Patient Education ©2016 Elsevier Inc.

## 2015-09-21 ENCOUNTER — Other Ambulatory Visit: Payer: Self-pay | Admitting: Obstetrics and Gynecology

## 2015-09-21 ENCOUNTER — Ambulatory Visit (INDEPENDENT_AMBULATORY_CARE_PROVIDER_SITE_OTHER): Payer: Managed Care, Other (non HMO) | Admitting: Obstetrics and Gynecology

## 2015-09-21 VITALS — BP 117/83 | HR 86 | Wt 199.4 lb

## 2015-09-21 DIAGNOSIS — Z331 Pregnant state, incidental: Secondary | ICD-10-CM

## 2015-09-21 DIAGNOSIS — Z36 Encounter for antenatal screening of mother: Secondary | ICD-10-CM

## 2015-09-21 DIAGNOSIS — Z3687 Encounter for antenatal screening for uncertain dates: Secondary | ICD-10-CM

## 2015-09-21 DIAGNOSIS — R638 Other symptoms and signs concerning food and fluid intake: Secondary | ICD-10-CM

## 2015-09-21 DIAGNOSIS — Z113 Encounter for screening for infections with a predominantly sexual mode of transmission: Secondary | ICD-10-CM

## 2015-09-21 DIAGNOSIS — Z3490 Encounter for supervision of normal pregnancy, unspecified, unspecified trimester: Secondary | ICD-10-CM

## 2015-09-21 NOTE — Patient Instructions (Signed)

## 2015-09-21 NOTE — Progress Notes (Signed)
Andrea Ellis presents for NOB nurse interview visit. [facility]  G-1.  P-0. Pregnancy eduction material explained and given. No cats in the home. NOB labs ordered. TSH/HbgA1c due to Increased BMI, HIV labs and Drug screen were explained optional and she could opt out of tests but did not decline. Drug screen ordered/decline and sent to lab. PNV encouraged. NT to discuss with provider, currently pt is unsure of LMP. Pt periods are very irregular. Will have to determine after ultrasound as when to schedule NOB physical and NT screening if pt desires.  Pt states she has not felt any fetal movement. Pt. To follow up with provider is pending ultrasound for NOB physical.  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 All questions answered.  ZIKA EXPOSURE SCREEN:  The patient has not traveled to a [place] endemic area within the past 6 months, nor has she had unprotected sex with a partner who has travelled to a [place] endemic region within the past 6 months. The patient has been advised to notify us if these factors change any time during this current pregnancy, so adequate testing and monitoring can be initiated.

## 2015-09-22 ENCOUNTER — Ambulatory Visit: Payer: Managed Care, Other (non HMO)

## 2015-09-22 ENCOUNTER — Other Ambulatory Visit: Payer: Self-pay

## 2015-09-22 DIAGNOSIS — Z3687 Encounter for antenatal screening for uncertain dates: Secondary | ICD-10-CM

## 2015-09-22 DIAGNOSIS — Z3490 Encounter for supervision of normal pregnancy, unspecified, unspecified trimester: Secondary | ICD-10-CM

## 2015-09-22 DIAGNOSIS — Z113 Encounter for screening for infections with a predominantly sexual mode of transmission: Secondary | ICD-10-CM

## 2015-09-23 LAB — PAIN MGT SCRN (14 DRUGS), UR
Amphetamine Screen, Ur: NEGATIVE ng/mL
BENZODIAZEPINE SCREEN, URINE: NEGATIVE ng/mL
BUPRENORPHINE, URINE: NEGATIVE ng/mL
Barbiturate Screen, Ur: NEGATIVE ng/mL
CREATININE(CRT), U: 205.1 mg/dL (ref 20.0–300.0)
Cannabinoids Ur Ql Scn: NEGATIVE ng/mL
Cocaine(Metab.)Screen, Urine: NEGATIVE ng/mL
FENTANYL, URINE: NEGATIVE pg/mL
MEPERIDINE SCREEN, URINE: NEGATIVE ng/mL
Methadone Scn, Ur: NEGATIVE ng/mL
OPIATE SCRN UR: NEGATIVE ng/mL
OXYCODONE+OXYMORPHONE UR QL SCN: NEGATIVE ng/mL
PCP SCRN UR: NEGATIVE ng/mL
PH UR, DRUG SCRN: 5.7 (ref 4.5–8.9)
Propoxyphene, Screen: NEGATIVE ng/mL
TRAMADOL UR QL SCN: NEGATIVE ng/mL

## 2015-09-23 LAB — MICROSCOPIC EXAMINATION
CASTS: NONE SEEN /LPF
Epithelial Cells (non renal): >10 /hpf — AB (ref 0–10)

## 2015-09-23 LAB — URINALYSIS, ROUTINE W REFLEX MICROSCOPIC
Bilirubin, UA: NEGATIVE
GLUCOSE, UA: NEGATIVE
Ketones, UA: NEGATIVE
NITRITE UA: NEGATIVE
RBC UA: NEGATIVE
Specific Gravity, UA: 1.03 (ref 1.005–1.030)
Urobilinogen, Ur: 0.2 mg/dL (ref 0.2–1.0)
pH, UA: 6 (ref 5.0–7.5)

## 2015-09-24 LAB — GC/CHLAMYDIA PROBE AMP
Chlamydia trachomatis, NAA: NEGATIVE
Neisseria gonorrhoeae by PCR: NEGATIVE

## 2015-09-25 LAB — URINE CULTURE, OB REFLEX

## 2015-09-25 LAB — CULTURE, OB URINE

## 2015-09-28 ENCOUNTER — Other Ambulatory Visit: Payer: Self-pay | Admitting: Obstetrics and Gynecology

## 2015-09-28 DIAGNOSIS — O2340 Unspecified infection of urinary tract in pregnancy, unspecified trimester: Secondary | ICD-10-CM | POA: Insufficient documentation

## 2015-09-28 MED ORDER — AMOXICILLIN 500 MG PO CAPS: 500.0000 mg | ORAL_CAPSULE | Freq: Three times a day (TID) | ORAL | Status: DC

## 2015-09-30 ENCOUNTER — Other Ambulatory Visit: Payer: Managed Care, Other (non HMO)

## 2015-10-01 ENCOUNTER — Encounter: Payer: Self-pay | Admitting: Obstetrics and Gynecology

## 2015-10-01 ENCOUNTER — Ambulatory Visit (INDEPENDENT_AMBULATORY_CARE_PROVIDER_SITE_OTHER): Payer: Managed Care, Other (non HMO) | Admitting: Obstetrics and Gynecology

## 2015-10-01 ENCOUNTER — Other Ambulatory Visit: Payer: Self-pay | Admitting: Obstetrics and Gynecology

## 2015-10-01 VITALS — BP 122/73 | HR 84 | Wt 201.4 lb

## 2015-10-01 DIAGNOSIS — Z3492 Encounter for supervision of normal pregnancy, unspecified, second trimester: Secondary | ICD-10-CM

## 2015-10-01 LAB — POCT URINALYSIS DIPSTICK
Bilirubin, UA: NEGATIVE
Blood, UA: NEGATIVE
Glucose, UA: NEGATIVE
KETONES UA: NEGATIVE
Leukocytes, UA: NEGATIVE
Nitrite, UA: NEGATIVE
SPEC GRAV UA: 1.015
UROBILINOGEN UA: 0.2
pH, UA: 6.5

## 2015-10-01 MED ORDER — PROMETHAZINE HCL 25 MG PO TABS: 25.0000 mg | ORAL_TABLET | Freq: Four times a day (QID) | ORAL | Status: DC | PRN

## 2015-10-01 NOTE — Progress Notes (Signed)
NOB-pt is c/o lots of headache, nauseated, has missed some days from work due to complaints

## 2015-10-01 NOTE — Patient Instructions (Signed)

## 2015-10-01 NOTE — Progress Notes (Signed)
NEW OB HISTORY AND PHYSICAL  SUBJECTIVE:       Andrea Ellis is a 29 y.o. G1P0 female, Patient's last menstrual period was 06/27/2015., Estimated Date of Delivery: 04/02/16, [id number], presents today for establishment of Prenatal Care. She has no unusual complaints and complains of headache and nausea with vomiting for <30 days      Gynecologic History Patient's last menstrual period was 06/27/2015. Normal Contraception: none Last Pap: NA. Results were: NA  Obstetric History OB History  Gravida Para Term Preterm AB SAB TAB Ectopic Multiple Living  1             # Outcome Date GA Lbr Len/2nd Weight Sex Delivery Anes PTL Lv  1 Current               Past Medical History  Diagnosis Date  . Headache     Past Surgical History  Procedure Laterality Date  . Knee surgery      Current Outpatient Prescriptions on File Prior to Visit  Medication Sig Dispense Refill  . Prenatal Vit-Fe Fumarate-FA (PRENATAL VITAMINS PLUS) 27-1 MG TABS Take 1 tablet by mouth daily. 30 tablet 9  . amoxicillin (AMOXIL) 500 MG capsule Take 1 capsule (500 mg total) by mouth 3 (three) times daily. (Patient not taking: Reported on 10/01/2015) 21 capsule 2   No current facility-administered medications on file prior to visit.    No Known Allergies  Social History   Social History  . Marital Status: Single    Spouse Name: N/A  . Number of Children: N/A  . Years of Education: N/A   Occupational History  . manufactoring    Social History Main Topics  . Smoking status: Never Smoker   . Smokeless tobacco: Never Used  . Alcohol Use: No  . Drug Use: No  . Sexual Activity:    Partners: Male    Birth Control/ Protection: None   Other Topics Concern  . Not on file   Social History Narrative    Family History  Problem Relation Age of Onset  . Alcohol abuse Father     The following portions of the patient's history were reviewed and updated as appropriate: allergies, current medications, past OB  history, past medical history, past surgical history, past family history, past social history, and problem list.    OBJECTIVE: Initial Physical Exam (New OB)  GENERAL APPEARANCE: alert, well appearing, in no apparent distress, oriented to person, place and time HEAD: normocephalic, atraumatic MOUTH: mucous membranes moist, pharynx normal without lesions THYROID: no thyromegaly or masses present BREASTS: no masses noted, no significant tenderness, no palpable axillary nodes, no skin changes LUNGS: clear to auscultation, no wheezes, rales or rhonchi, symmetric air entry HEART: regular rate and rhythm, no murmurs ABDOMEN: soft, nontender, nondistended, no abnormal masses, no epigastric pain and FHT present EXTREMITIES: no redness or tenderness in the calves or thighs SKIN: normal coloration and turgor, no rashes LYMPH NODES: no adenopathy palpable NEUROLOGIC: alert, oriented, normal speech, no focal findings or movement disorder noted  PELVIC EXAM EXTERNAL GENITALIA: normal appearing vulva with no masses, tenderness or lesions CERVIX: no lesions or cervical motion tenderness  ASSESSMENT: Normal pregnancy Nausea & Vomiting  PLAN: Prenatal care Desires genetic screening RX for phenergan sent in See orders

## 2015-10-02 ENCOUNTER — Telehealth: Payer: Self-pay | Admitting: *Deleted

## 2015-10-02 NOTE — Telephone Encounter (Signed)
Was discussed at her ov 10/01/15

## 2015-10-02 NOTE — Telephone Encounter (Signed)
-----   Message from [place], [place] sent at 09/28/2015 10:27 AM EST ----- Please let her know she has a UTI, sent in antibiotics, needs to take all prescribed medication, also eat yogurt daily to prevent yeast infection while on medication

## 2015-10-05 ENCOUNTER — Other Ambulatory Visit: Payer: Self-pay | Admitting: Obstetrics and Gynecology

## 2015-10-05 LAB — CYTOLOGY - PAP

## 2015-10-06 LAB — RPR QUALITATIVE: RPR Ser Ql: NONREACTIVE

## 2015-10-06 LAB — CBC WITH DIFFERENTIAL/PLATELET
BASOS: 0 %
Basophils Absolute: 0 10*3/uL (ref 0.0–0.2)
EOS (ABSOLUTE): 0.1 10*3/uL (ref 0.0–0.4)
EOS: 1 %
Hematocrit: 40.1 % (ref 34.0–46.6)
Hemoglobin: 13.3 g/dL (ref 11.1–15.9)
IMMATURE GRANS (ABS): 0.1 10*3/uL (ref 0.0–0.1)
Immature Granulocytes: 1 %
Lymphocytes Absolute: 3 10*3/uL (ref 0.7–3.1)
Lymphs: 28 %
MCH: 29.3 pg (ref 26.6–33.0)
MCHC: 33.2 g/dL (ref 31.5–35.7)
MCV: 88 fL (ref 79–97)
MONOS ABS: 0.8 10*3/uL (ref 0.1–0.9)
Monocytes: 7 %
NEUTROS PCT: 63 %
Neutrophils Absolute: 6.7 10*3/uL (ref 1.4–7.0)
Platelets: 335 10*3/uL (ref 150–379)
RBC: 4.54 x10E6/uL (ref 3.77–5.28)
RDW: 13.9 % (ref 12.3–15.4)
WBC: 10.6 10*3/uL (ref 3.4–10.8)

## 2015-10-06 LAB — RUBELLA SCREEN: Rubella Antibodies, IGG: 5.15 index (ref >0.99)

## 2015-10-06 LAB — ABO/RH: Rh Factor: POSITIVE

## 2015-10-06 LAB — HGB A1C W/O EAG: Hgb A1c MFr Bld: 5.6 % (ref 4.8–5.6)

## 2015-10-06 LAB — HIV ANTIBODY (ROUTINE TESTING W REFLEX): HIV Screen 4th Generation wRfx: NONREACTIVE

## 2015-10-06 LAB — ANTIBODY SCREEN: Antibody Screen: NEGATIVE

## 2015-10-06 LAB — VARICELLA ZOSTER ANTIBODY, IGG: Varicella zoster IgG: 928 index (ref >165)

## 2015-10-06 LAB — HEPATITIS B SURFACE ANTIGEN: HEP B S AG: NEGATIVE

## 2015-10-12 ENCOUNTER — Encounter: Payer: Self-pay | Admitting: Obstetrics and Gynecology

## 2015-10-13 ENCOUNTER — Other Ambulatory Visit: Payer: Self-pay | Admitting: Obstetrics and Gynecology

## 2015-10-29 ENCOUNTER — Encounter: Payer: Self-pay | Admitting: Obstetrics and Gynecology

## 2015-10-29 ENCOUNTER — Ambulatory Visit (INDEPENDENT_AMBULATORY_CARE_PROVIDER_SITE_OTHER): Payer: Managed Care, Other (non HMO) | Admitting: Obstetrics and Gynecology

## 2015-10-29 VITALS — BP 110/72 | HR 85 | Wt 206.2 lb

## 2015-10-29 DIAGNOSIS — Z331 Pregnant state, incidental: Secondary | ICD-10-CM

## 2015-10-29 LAB — POCT URINALYSIS DIPSTICK
BILIRUBIN UA: NEGATIVE
Blood, UA: NEGATIVE
GLUCOSE UA: NEGATIVE
Ketones, UA: NEGATIVE
LEUKOCYTES UA: NEGATIVE
NITRITE UA: NEGATIVE
Protein, UA: NEGATIVE
Spec Grav, UA: 1.01
UROBILINOGEN UA: 0.2
pH, UA: 6.5

## 2015-10-29 NOTE — Progress Notes (Signed)
ROB- pt denies any new complaints 

## 2015-10-29 NOTE — Progress Notes (Signed)
ROB- no complaints, anatomy scan in 2 weeks, declined flu vaccine

## 2015-11-01 NOTE — L&D Delivery Note (Cosign Needed)
Delivery Note At  a viable and healthy female "Genisis' was delivered via  (Presentation:OA ;  ).  APGAR:8 ,9 ; weight  .  7#7oz Placenta status: delivered intact with 3 vessel Cord:  with the following complications: none.    Anesthesia:  epidural Episiotomy:  none Lacerations:  2nd degree with bilateral labial lacerations-all repaired Suture Repair: 3.0 vicryl rapide Est. Blood Loss (mL):  300  Mom to postpartum.  Baby to Couplet care / Skin to Skin.  Melody NIKE Shambley, CNM 03/20/2016, 2:15 AM

## 2015-11-11 ENCOUNTER — Other Ambulatory Visit: Payer: Managed Care, Other (non HMO)

## 2015-11-13 ENCOUNTER — Ambulatory Visit (INDEPENDENT_AMBULATORY_CARE_PROVIDER_SITE_OTHER): Payer: Managed Care, Other (non HMO)

## 2015-11-13 DIAGNOSIS — Z331 Pregnant state, incidental: Secondary | ICD-10-CM

## 2015-11-27 ENCOUNTER — Ambulatory Visit (INDEPENDENT_AMBULATORY_CARE_PROVIDER_SITE_OTHER): Payer: Managed Care, Other (non HMO) | Admitting: Obstetrics and Gynecology

## 2015-11-27 ENCOUNTER — Encounter: Payer: Self-pay | Admitting: Obstetrics and Gynecology

## 2015-11-27 VITALS — BP 116/73 | HR 88 | Wt 212.0 lb

## 2015-11-27 DIAGNOSIS — Z331 Pregnant state, incidental: Secondary | ICD-10-CM

## 2015-11-27 LAB — POCT URINALYSIS DIPSTICK
BILIRUBIN UA: NEGATIVE
Blood, UA: NEGATIVE
GLUCOSE UA: NEGATIVE
KETONES UA: NEGATIVE
LEUKOCYTES UA: NEGATIVE
Nitrite, UA: NEGATIVE
Protein, UA: NEGATIVE
Spec Grav, UA: 1.01
Urobilinogen, UA: 0.2
pH, UA: 6.5

## 2015-11-27 NOTE — Progress Notes (Signed)
ROB-pt is c/o low back pain, had numbness in her R leg this am

## 2015-11-27 NOTE — Progress Notes (Signed)
ROB_  Reviewed anatomy scan; Indications: Anatomy Findings:  Singleton intrauterine pregnancy is visualized with FHR at 147 BPM. Biometrics give an (U/S) Gestational age of [redacted] weeks and an (U/S) EDD of 04/01/16; this correlates with the clinically established EDD of 04/02/16.  Fetal presentation is Variable ( start of ultrasound, cephalic - end of ultrasound, transverse).  EFW: 333 grams ( 0 lbs. 12 oz. ) Placenta: Posterior, grade 0 and remote to cervix. AFI: Adequate with MVP at 5.3 cm  Anatomic survey is complete and appears normal. Gender - Female.   Right Ovary measures 3.9 x 2.4 x 2.8 cm. It is normal in appearance. Left Ovary measures 3.7 x 2.5 x 2.4 cm. It is normal appearance. There is no evidence of a corpus luteal cyst. Survey of the adnexa demonstrates no adnexal masses. There is no free peritoneal fluid in the cul de sac.  Impression: 1. 20 week Viable Singleton Intrauterine pregnancy by U/S. 2. (U/S) EDD is consistent with Clinically established (LMP) EDD of 04/02/16. 3. Normal appearing anatomy scan.   Reports worsening back and and 1 episode sciatic nerve pain- referred to Chiropractor Also plans [place] travel [place] discussed flying precautions and Zika precautions.

## 2015-11-27 NOTE — Patient Instructions (Signed)
Embarazo y enfermedad por el virus del Zika (Pregnancy and Zika Virus Disease) Las personas pueden contraer la enfermedad por el virus del [place], o el zika, a travs de los mosquitos que transmiten el virus. Tambin puede transmitirse de Neomia Dear persona a la otra a travs de lquidos corporales infectados. El primer caso de zika se produjo en frica, pero recientemente la enfermedad se ha propagado a nuevas regiones. El virus aparece en climas tropicales. El zika sigue expandindose a diferentes ubicaciones. La [employer] de las personas infectadas por el virus del Zika no tienen una enfermedad grave. Sin embargo, el zika puede causar defectos congnitos en un feto cuya madre contrajo el virus. Tambin puede aumentar el riesgo de aborto espontneo. CULES SON LOS SNTOMAS DE LA ENFERMEDAD POR EL VIRUS DEL ZIKA? En muchos casos, las personas infectadas por el virus del Zika no tienen sntomas. Si estos aparecen, por lo general comienzan alrededor de una semana despus de que la persona contrae la infeccin. Los sntomas suelen ser leves. Estos pueden incluir los siguientes:  Teacher, English as a foreign language.  Erupcin cutnea.  Ojos rojos.  Dolor en las articulaciones. CMO SE TRANSMITE LA ENFERMEDAD POR EL VIRUS DEL ZIKA? El virus del [place] se transmite principalmente a travs de la picadura de un determinado tipo de mosquito. A diferencia de la mayora de los tipos de mosquitos, que solo pican por la noche, Optometrist del virus del Zika pica tanto durante la noche como durante Medical laboratory scientific officer. El virus del [place] puede transmitirse a travs del contacto sexual, de una transfusin de [place] y de la madre al feto antes del parto o [place]. Una vez que ha tenido la enfermedad por el virus del [place], es poco probable que vuelva a Surveyor, minerals. PUEDO TRANSMITIRLE EL ZIKA AL BEB DURANTE EL EMBARAZO? S, la madre puede transmitirle el zika al beb antes o durante el [address]. QU PROBLEMAS PUEDE CAUSARLE EL ZIKA AL BEB? Una  mujer que contrae el virus del Zika mientras est embarazada corre riesgo de que el beb nazca con una enfermedad en la cual el cerebro o la cabeza son ms pequeos de lo esperado (microcefalia). Los bebs con microcefalia pueden tener retrasos en el desarrollo, convulsiones, problemas de audicin y [place] de visin. Si una mujer embarazada contrae el virus del [place], tiene un mayor riesgo de aborto espontneo. CMO PUEDE PREVENIRSE LA ENFERMEDAD POR EL VIRUS DEL ZIKA? No hay ninguna vacuna para prevenir el zika. La mejor forma de prevenir la enfermedad es evitar los mosquitos infectados y la exposicin a lquidos corporales que puedan transmitir el virus. Tenga en cuenta las siguientes precauciones para evitar cualquier posible exposicin al zika. Para las mujeres y sus parejas sexuales:  Evite viajar a zonas de Conservator, museum/gallery. Los lugares en los que se informan casos de zika cambian con frecuencia. Para identificar las regiones de alto riesgo, consulte el sitio web de viajes de los CDC: [email or web address]  Si usted o su pareja deben viajar a una zona de alto riesgo, consulten a un mdico antes y despus de Tourist information centre manager.  Si vive en cualquiera de las regiones de alto riesgo o debe viajar a una de estas zonas, tome todas las precauciones para evitar las picaduras de los mosquitos. Los repelentes para mosquitos se pueden Chemical engineer de forma segura Academic librarian.  Pregntele al mdico cundo podr tener contacto sexual de forma segura. Para las mujeres:  Si est embarazada o est intentando quedar embarazada, evite el contacto sexual con personas que pueden haber [place]  expuestos al virus del Zika, personas que tengan posibles sntomas del zika u personas que tengan antecedentes de los que usted no est segura. Si elige tener contacto sexual con una pareja masculina que puede haber estado expuesta al virus del [place], utilice preservativos de forma correcta durante toda la actividad sexual,  cada vez que tenga contacto sexual. No comparta los [place], ya que es posible quedar expuesto a los lquidos corporales.  Pregntele al mdico cundo puede buscar un embarazo sin correr riesgos despus de Neomia Dear posible exposicin al virus del Zika. QU MEDIDAS DEBO TOMAR PARA EVITAR LAS PICADURAS DE LOS MOSQUITOS? Tome estas medidas para evitar las picaduras de los mosquitos cuando est en zonas de alto riesgo:  Use ropa suelta que le cubra los brazos y las piernas.  Limite las actividades al [employer].  No abra las ventanas, a menos que tengan mosquiteros.  Duerma con una red para mosquitos.  Use repelente para insectos. Los mejores repelentes para insectos tienen estas caractersticas:  Incluyen DEET, picaridina, aceite de eucalipto de limn (OLE) o IR3535.  Contienen mayor cantidad de [place].  Recuerde que los repelentes para mosquitos se pueden Chemical engineer de forma segura durante el [place].  No use OLE en nios menores de 3aos. No use repelente para insectos en bebs menores de .  Cubra el cochecito del nio con un mosquitero. Asegrese de que el mosquitero se ajuste perfectamente y de que no haya partes flojas que cubran la boca o la nariz del beb. No use Manufacturing engineer los mosquitos.  No se aplique repelente para insectos debajo de la ropa.  Si [place] pantalla solar, aplquesela antes del repelente para insectos.  Trate la ropa con permetrina. No aplique permetrina directamente sobre la piel. Siga las indicaciones sobre el uso seguro que figuran en la etiqueta.  Deseche el agua estancada donde los mosquitos pueden reproducirse. Suele haber agua estancada en objetos como cubetas, tazones, platos para alimento de [place] y [place]. Al regresar de cualquiera de estas zonas, es necesario seguir tomando medidas para protegerse contra las picaduras de los mosquitos durante 3semanas, aunque no haya signos de la enfermedad. Esto evitar  que el virus del Zika se transmita a los mosquitos que no estn infectados. QU DEBO SABER SOBRE LA TRANSMISIN SEXUAL DEL ZIKA? Las Company secretary transmitir el zika a sus parejas sexuales durante el sexo vaginal, anal u oral, o al compartir [employer]. Muchas personas que tienen zika no manifiestan sntomas; por lo tanto, una persona podra diseminar la enfermedad sin saber que est infectada. El mayor riesgo es para las [place] y para las mujeres que pueden quedar [place]. El virus del [place] puede vivir ms tiempo en el semen que en la [place]. Las Personnel officer la transmisin del virus si hacen lo siguiente:  Usan siempre preservativos de Wellsite geologist correcta durante todo el tiempo que dure la actividad sexual. Esto incluye el sexo vaginal, anal y oral.  No comparten los objetos sexuales. Al compartirlos se incrementa el riesgo de exposicin a los lquidos corporales de Engineer, maintenance (IT).  Evitan toda la actividad sexual hasta que el mdico diga que no hay peligro. DEBO HACERME UN ANLISIS DE DETECCIN DEL VIRUS DEL ZIKA? Pueden extraerle sangre para detectar la presencia del virus del [place]. Si una mujer embarazada ha estado expuesta al virus o tiene sntomas del [place], debe realizarse el [place]. Tambin pueden hacerle otras pruebas durante el embarazo, por ejemplo, ecografas. Hable con el mdico [employer]  ms convenientes.   Esta informacin no tiene Theme park manager el consejo del mdico. Asegrese de hacerle al mdico cualquier pregunta que tenga.   Document Released: 07/08/2015 Elsevier Interactive Patient Education 2016 [employer].  Prevencin de la enfermedad por el virus del Zika (Zika Virus Prevention) Las personas pueden contraer la enfermedad por el virus del [place], o el zika, a travs de los mosquitos que transmiten el virus. Tambin puede transmitirse de Neomia Dear persona a la otra a travs de lquidos corporales infectados. El primer caso de zika se produjo  en frica, pero recientemente la enfermedad se ha propagado a nuevas regiones. El virus aparece en climas tropicales. El zika sigue expandindose a diferentes ubicaciones. La [employer] de las personas infectadas por el virus del Zika no tienen una enfermedad grave. La enfermedad por el virus del [place] puede causar defectos congnitos en un feto cuya madre contrajo el virus. Tambin puede aumentar el riesgo de aborto espontneo. CMO SE TRANSMITE LA ENFERMEDAD POR EL VIRUS DEL ZIKA? El virus del [place] se transmite principalmente a travs de la picadura de un determinado tipo de mosquito. A diferencia de la mayora de los tipos de mosquitos, que solo pican por la noche, Optometrist del virus del Zika pica tanto durante la noche como durante Medical laboratory scientific officer. El virus del [place] puede transmitirse a travs del contacto sexual, de una transfusin de [place] y de la madre al feto antes del parto o [place]. Una vez que ha tenido la enfermedad por el virus del [place], es poco probable que vuelva a Surveyor, minerals. CULES SON LOS SNTOMAS DE LA ENFERMEDAD POR EL VIRUS DEL ZIKA? En muchos casos, las personas infectadas por el virus del Zika no tienen sntomas. Si estos aparecen, por lo general comienzan alrededor de una semana despus de que la persona contrae la infeccin. Los sntomas suelen ser leves. Estos pueden incluir los siguientes:  Teacher, English as a foreign language.  Erupcin cutnea.  Ojos rojos.  Dolor en las articulaciones. Una mujer que contrae el virus del Zika mientras est embarazada corre riesgo de que el beb nazca con una enfermedad en la cual el cerebro o la cabeza son ms pequeos de lo esperado (microcefalia). Los bebs con microcefalia pueden tener retrasos en el desarrollo, convulsiones, problemas de audicin y [place] de visin. En contadas ocasiones, una persona puede desarrollar un trastorno llamado sndrome de Guillain-Barr despus de tener una enfermedad viral como el zika. Entre los sntomas de este  trastorno se incluye debilidad [employer], las piernas o Recruitment consultant. CMO DIAGNOSTICAN LOS MDICOS LA ENFERMEDAD POR EL VIRUS DEL ZIKA? Se puede analizar Lauris Poag de sangre para Engineer, manufacturing la presencia del virus del [place]. CMO PUEDE PREVENIRSE LA ENFERMEDAD POR EL VIRUS DEL ZIKA? No hay ninguna vacuna para prevenir el zika. La mejor forma de prevenir la enfermedad es evitar los mosquitos infectados y la exposicin a lquidos corporales que puedan transmitir el virus. La prevencin es importante para todos. Debido al riesgo para los fetos, es especialmente importante que las [place], las mujeres que intentan quedar embarazadas y las parejas sexuales de estas mujeres tomen precauciones. QU DEBO SABER SOBRE LOS VIAJES?  Los lugares en los que se informan casos de zika cambian con frecuencia. Para identificar las regiones de alto riesgo, consulte el sitio web de viajes de los CDC: [email or web address]  Si vive en cualquiera de las regiones de alto riesgo o debe viajar a una de estas zonas, tome todas las precauciones para evitar las picaduras de los mosquitos.  Las [place], las mujeres que intentan quedar embarazadas y las parejas sexuales de estas mujeres deben hablar con los mdicos antes y despus de Tourist information centre manager a Neomia Dear regin de Conservator, museum/gallery.  Al regresar de cualquiera de estas zonas, es necesario seguir tomando medidas para protegerse contra las picaduras de los mosquitos durante 3semanas, aunque no haya signos de la enfermedad. Esto evitar que el virus del Zika se transmita a los mosquitos que no estn infectados. QU MEDIDAS DEBO TOMAR PARA EVITAR LAS PICADURAS DE LOS MOSQUITOS? Tome estas medidas para evitar las picaduras de los mosquitos cuando est en zonas de alto riesgo:  Use ropa suelta que le cubra los brazos y las piernas.  Limite las actividades al [employer].  No abra las ventanas, a menos que tengan mosquiteros.  Duerma con una red para mosquitos.  Use  repelente para insectos. Los mejores repelentes para insectos tienen estas caractersticas:  Incluyen DEET, picaridina, aceite de eucalipto de limn (OLE) o IR3535.  Contienen mayor cantidad de [place].  Recuerde que los repelentes para mosquitos se pueden Chemical engineer de forma segura durante el [place].  No use OLE en nios menores de 3aos. No use repelente para insectos en bebs menores de .  Cubra el cochecito del nio con un mosquitero. Asegrese de que el mosquitero se ajuste perfectamente y de que no haya partes flojas que cubran la boca o la nariz del beb. No use Manufacturing engineer los mosquitos.  No se aplique repelente para insectos debajo de la ropa.  Si [place] pantalla solar, aplquesela antes del repelente para insectos.  Trate la ropa con permetrina. No aplique permetrina directamente sobre la piel. Siga las indicaciones sobre el uso seguro que figuran en la etiqueta.  Deseche el agua estancada donde los mosquitos pueden reproducirse. Suele haber agua estancada en objetos como cubetas, tazones, platos para alimento de [place] y [place]. QU DEBO SABER SOBRE LA DONACIN DE SANGRE? A fin de evitar la transmisin del zika a travs de la sangre, deber [employer] siguientes lapsos de [place] para donar sangre:  4semanas despus de haber viajado a una zona en la que se han informado casos de zika.  4semanas despus de haber tenido sntomas de zika.  4semanas despus de haber tenido contacto sexual con:  Neomia Dear persona que haya tenido zika.  Una persona que haya viajado a una regin de Conservator, museum/gallery en algn momento durante los previos a que ocurriera el contacto sexual. Debe esperar este tiempo porque el virus del Zika puede vivir ms tiempo en el semen que en la [place]. QU DEBO SABER SOBRE LA TRANSMISIN SEXUAL DEL ZIKA? Las Engineer, manufacturing systems el zika a sus parejas sexuales durante el sexo vaginal, anal u oral. Muchas personas  que tienen zika no manifiestan sntomas; por lo tanto, una persona podra diseminar la enfermedad sin saber que est infectado. El mayor riesgo es para las [place] y para las mujeres que pueden quedar [place]. Las Personnel officer la transmisin del virus si hacen lo siguiente:  Usan siempre preservativos de Wellsite geologist correcta durante todo el tiempo que dure la actividad sexual. Esto incluye el sexo vaginal, anal y oral.  No comparten los objetos sexuales. Al compartirlos se incrementa el riesgo de exposicin a los lquidos corporales de Engineer, maintenance (IT).  Evitan toda la actividad sexual hasta que el mdico diga que no hay peligro. Estas precauciones son especialmente importantes para las parejas en las que una persona ha viajado a una regin donde hay  zika o tiene sntomas de la enfermedad. Hable con el mdico sobre el riesgo que usted corre.   Esta informacin no tiene Theme park manager el consejo del mdico. Asegrese de hacerle al mdico cualquier pregunta que tenga.   Document Released: 03/03/2015 Document Revised: 07/08/2015 Elsevier Interactive Patient Education 2016 [employer].    Engineer, agricultural  (Sciatica)  La citica es el dolor, debilidad, entumecimiento u hormigueo a lo largo del nervio citico. El nervio comienza en la zona inferior de la espalda y desciende por la parte posterior de cada pierna. El nervio controla los msculos de la parte inferior de la pierna y de la zona posterior de la rodilla, y transmite la sensibilidad a la parte posterior del muslo, la pierna y la planta del pie. La citica es un sntoma de otras afecciones mdicas. Por ejemplo, un dao a los nervios o algunas enfermedades como un disco herniado o un espoln seo en la columna vertebral, podran daarle o presionar en el nervio citico. Esto causa dolor, debilidad y otras sensaciones normalmente asociadas con la citica. Generalmente la citica afecta slo un lado del cuerpo. CAUSAS   Disco herniado o  desplazado.  Enfermedad degenerativa del disco.  Un sndrome doloroso que compromete un msculo angosto de los glteos (sndrome piriforme).  Lesin o fractura plvica.  Embarazo.  Tumor (casos raros). SNTOMAS  Los sntomas pueden variar de leves a muy graves. Por lo general, los sntomas descienden desde la zona lumbar a las nalgas y la parte posterior de la pierna. Ellos son:   Hormigueo leve o dolor sordo en la parte inferior de la espalda, la pierna o la cadera.  Adormecimiento en la parte posterior de la pantorrilla o la planta del pie.  Sensacin de [employer] zona lumbar, la pierna o la cadera.  Dolor agudo en la zona inferior de la espalda, la pierna o la cadera.  Debilidad en las piernas.  Dolor de espalda intenso que [employer] movimientos. Los sntomas pueden empeorar al toser, Engineering geologist, rer o estar sentado o parado durante [employer]. [place], el sobrepeso puede empeorar los sntomas.  DIAGNSTICO  Su mdico le har un examen fsico para buscar los sntomas comunes de la citica. Le pedir que haga algunos movimientos o actividades que activaran el dolor del nervio citico. Para encontrar las causas de la citica podr indicarle otros estudios. Estos pueden ser:   [place]is de [place].  Radiografas.  Pruebas de diagnstico por imgenes, como resonancia magntica o tomografa computada. TRATAMIENTO  El tratamiento se dirige a las causas de la citica. A veces, el tratamiento no es necesario, y Chief Technology Officer y Environmental health practitioner desaparecen por s mismos. Si necesita tratamiento, su mdico puede sugerir:   Medicamentos de venta libre para Engineer, materials.  Medicamentos recetados, como antiinflamatorios, relajantes musculares o narcticos.  Aplicacin de calor o hielo en la zona del dolor.  Inyecciones de corticoides para disminuir el dolor, la irritacin y la inflamacin alrededor del nervio.  Reduccin de la [employer] perodos de [place].  Ejercicios y  estiramiento del abdomen para [place] y Scientist, clinical (histocompatibility and immunogenetics) la flexibilidad de la columna vertebral. Su mdico puede sugerirle perder peso si el peso extra empeora el dolor de espalda.  Fisioterapia.  La ciruga para eliminar lo que presiona o pincha el nervio, como un espoln seo o parte de una hernia de disco. INSTRUCCIONES PARA EL CUIDADO EN EL HOGAR   Slo tome medicamentos de venta libre o recetados para Primary school teacher o Environmental health practitioner,  segn las indicaciones de su mdico.  Aplique hielo sobre el rea dolorida durante 20 minutos 3-4 veces por [employer] primeras 48-72 horas. Luego intente aplicar calor de la misma manera.  Haga ejercicios, elongue o realice sus actividades habituales, si no le causan ms dolor.  Cumpla con todas las sesiones de fisioterapia, segn le indique su mdico.  Cumpla con todas las visitas de control, segn le indique su mdico.  No use tacones altos o zapatos que no tengan buen apoyo.  Verifique que el colchn no sea muy blando. Un colchn firme Engineer, materials y las [place]. SOLICITE ATENCIN MDICA DE INMEDIATO SI:   Pierde el control de la vejiga o del intestino (incontinencia).  Aumenta la debilidad en la zona inferior de la espalda, la pelvis, las nalgas o las piernas.  Siente irritacin o inflamacin en la espalda.  Tiene sensacin de ardor al [employer].  El dolor empeora cuando se acuesta o lo despierta por la noche.  El dolor es peor del que experiment en el pasado.  Dura ms de 4 semanas.  Pierde peso sin motivo de [place] sbita. ASEGRESE DE QUE:   Comprende estas instrucciones.  Controlar su enfermedad.  Solicitar ayuda de inmediato si no mejora o si empeora.   Esta informacin no tiene Theme park manager el consejo del mdico. Asegrese de hacerle al mdico cualquier pregunta que tenga.   Document Released: 10/17/2005 Document Revised: 07/08/2015 Elsevier Interactive Patient Education [employer]

## 2015-12-25 ENCOUNTER — Ambulatory Visit (INDEPENDENT_AMBULATORY_CARE_PROVIDER_SITE_OTHER): Payer: Managed Care, Other (non HMO) | Admitting: Obstetrics and Gynecology

## 2015-12-25 ENCOUNTER — Encounter: Payer: Self-pay | Admitting: Obstetrics and Gynecology

## 2015-12-25 VITALS — BP 126/68 | HR 91 | Wt 216.3 lb

## 2015-12-25 DIAGNOSIS — Z331 Pregnant state, incidental: Secondary | ICD-10-CM

## 2015-12-25 LAB — POCT URINALYSIS DIPSTICK
Bilirubin, UA: NEGATIVE
Glucose, UA: NEGATIVE
KETONES UA: NEGATIVE
Leukocytes, UA: NEGATIVE
Nitrite, UA: NEGATIVE
PH UA: 6.5
PROTEIN UA: NEGATIVE
RBC UA: NEGATIVE
UROBILINOGEN UA: 0.2

## 2015-12-25 NOTE — Progress Notes (Signed)
ROB-pt denies any complaints 

## 2015-12-25 NOTE — Progress Notes (Signed)
ROB-doing well, glucola next visit. 

## 2016-01-14 ENCOUNTER — Other Ambulatory Visit: Payer: Self-pay | Admitting: Obstetrics and Gynecology

## 2016-01-14 ENCOUNTER — Encounter: Payer: Self-pay | Admitting: Obstetrics and Gynecology

## 2016-01-14 ENCOUNTER — Ambulatory Visit (INDEPENDENT_AMBULATORY_CARE_PROVIDER_SITE_OTHER): Payer: Managed Care, Other (non HMO) | Admitting: Obstetrics and Gynecology

## 2016-01-14 ENCOUNTER — Encounter: Payer: Managed Care, Other (non HMO) | Admitting: Obstetrics and Gynecology

## 2016-01-14 VITALS — BP 121/66 | HR 108 | Wt 221.0 lb

## 2016-01-14 DIAGNOSIS — Z331 Pregnant state, incidental: Secondary | ICD-10-CM | POA: Diagnosis not present

## 2016-01-14 DIAGNOSIS — Z23 Encounter for immunization: Secondary | ICD-10-CM

## 2016-01-14 LAB — POCT URINALYSIS DIPSTICK
Bilirubin, UA: NEGATIVE
Blood, UA: NEGATIVE
Glucose, UA: NEGATIVE
KETONES UA: NEGATIVE
LEUKOCYTES UA: NEGATIVE
Nitrite, UA: NEGATIVE
PROTEIN UA: NEGATIVE
Spec Grav, UA: <=1.005
Urobilinogen, UA: 0.2
pH, UA: 6.5

## 2016-01-14 MED ORDER — TETANUS-DIPHTH-ACELL PERTUSSIS 5-2.5-18.5 LF-MCG/0.5 IM SUSP
0.5000 mL | Freq: Once | INTRAMUSCULAR | Status: AC
  Administered 2016-01-14: 0.5 mL via INTRAMUSCULAR

## 2016-01-14 NOTE — Patient Instructions (Signed)
Tercer trimestre de embarazo (Third Trimester of Pregnancy) El tercer trimestre comprende desde la semana29 hasta la semana42, es decir, desde el mes7 hasta el mes9. El tercer trimestre es un perodo en el que el feto crece rpidamente. Hacia el final del noveno mes, el feto mide alrededor de 20pulgadas (45cm) de largo y pesa entre 6 y 10 libras (2,700 y 4,500kg).  CAMBIOS EN EL ORGANISMO Su organismo atraviesa por muchos cambios durante el embarazo, y estos varan de una mujer a otra.   Seguir aumentando de peso. Es de esperar que aumente entre 25 y 35libras (11 y 16kg) hacia el final del embarazo.  Podrn aparecer las primeras estras en las caderas, el abdomen y las mamas.  Puede tener necesidad de orinar con ms frecuencia porque el feto baja hacia la pelvis y ejerce presin sobre la vejiga.  Debido al embarazo podr sentir acidez estomacal con frecuencia.  Puede estar estreida, ya que ciertas hormonas enlentecen los movimientos de los msculos que empujan los desechos a travs de los intestinos.  Pueden aparecer hemorroides o abultarse e hincharse las venas (venas varicosas).  Puede sentir dolor plvico debido al aumento de peso y a que las hormonas del embarazo relajan las articulaciones entre los huesos de la pelvis. El dolor de espalda puede ser consecuencia de la sobrecarga de los msculos que soportan la postura.  Tal vez haya cambios en el cabello que pueden incluir su engrosamiento, crecimiento rpido y cambios en la textura. Adems, a algunas mujeres se les cae el cabello durante o despus del embarazo, o tienen el cabello seco o fino. Lo ms probable es que el cabello se le normalice despus del nacimiento del beb.  Las mamas seguirn creciendo y le dolern. A veces, puede haber una secrecin amarilla de las mamas llamada calostro.  El ombligo puede salir hacia afuera.  Puede sentir que le falta el aire debido a que se expande el tero.  Puede notar que el feto  "baja" o lo siente ms bajo, en el abdomen.  Puede tener una prdida de secrecin mucosa con sangre. Esto suele ocurrir en el trmino de unos pocos das a una semana antes de que comience el trabajo de parto.  El cuello del tero se vuelve delgado y blando (se borra) cerca de la fecha de parto. QU DEBE ESPERAR EN LOS EXMENES PRENATALES  Le harn exmenes prenatales cada 2semanas hasta la semana36. A partir de ese momento le harn exmenes semanales. Durante una visita prenatal de rutina:  La pesarn para asegurarse de que usted y el feto estn creciendo normalmente.  Le tomarn la presin arterial.  Le medirn el abdomen para controlar el desarrollo del beb.  Se escucharn los latidos cardacos fetales.  Se evaluarn los resultados de los estudios solicitados en visitas anteriores.  Le revisarn el cuello del tero cuando est prxima la fecha de parto para controlar si este se ha borrado. Alrededor de la semana36, el mdico le revisar el cuello del tero. Al mismo tiempo, realizar un anlisis de las secreciones del tejido vaginal. Este examen es para determinar si hay un tipo de bacteria, estreptococo Grupo B. El mdico le explicar esto con ms detalle. El mdico puede preguntarle lo siguiente:  Cmo le gustara que fuera el parto.  Cmo se siente.  Si siente los movimientos del beb.  Si ha tenido sntomas anormales, como prdida de lquido, sangrado, dolores de cabeza intensos o clicos abdominales.  Si est consumiendo algn producto que contenga tabaco, como cigarrillos, tabaco   de mascar y cigarrillos electrnicos.  Si tiene alguna pregunta. Otros exmenes o estudios de deteccin que pueden realizarse durante el tercer trimestre incluyen lo siguiente:  Anlisis de sangre para controlar los niveles de hierro (anemia).  Controles fetales para determinar su salud, nivel de actividad y crecimiento. Si tiene alguna enfermedad o hay problemas durante el embarazo, le harn  estudios.  Prueba del VIH (virus de inmunodeficiencia humana). Si corre un riesgo alto, pueden realizarle una prueba de deteccin del VIH durante el tercer trimestre del embarazo. FALSO TRABAJO DE PARTO Es posible que sienta contracciones leves e irregulares que finalmente desaparecen. Se llaman contracciones de Braxton Hicks o falso trabajo de parto. Las contracciones pueden durar horas, das o incluso semanas, antes de que el verdadero trabajo de parto se inicie. Si las contracciones ocurren a intervalos regulares, se intensifican o se hacen dolorosas, lo mejor es que la revise el mdico.  SIGNOS DE TRABAJO DE PARTO   Clicos de tipo menstrual.  Contracciones cada 5minutos o menos.  Contracciones que comienzan en la parte superior del tero y se extienden hacia abajo, a la zona inferior del abdomen y la espalda.  Sensacin de mayor presin en la pelvis o dolor de espalda.  Una secrecin de mucosidad acuosa o con sangre que sale de la vagina. Si tiene alguno de estos signos antes de la semana37 del embarazo, llame a su mdico de inmediato. Debe concurrir al hospital para que la controlen inmediatamente. INSTRUCCIONES PARA EL CUIDADO EN EL HOGAR   Evite fumar, consumir hierbas, beber alcohol y tomar frmacos que no le hayan recetado. Estas sustancias qumicas afectan la formacin y el desarrollo del beb.  No consuma ningn producto que contenga tabaco, lo que incluye cigarrillos, tabaco de mascar y cigarrillos electrnicos. Si necesita ayuda para dejar de fumar, consulte al mdico. Puede recibir asesoramiento y otro tipo de recursos para dejar de fumar.  Siga las indicaciones del mdico en relacin con el uso de medicamentos. Durante el embarazo, hay medicamentos que son seguros de tomar y otros que no.  Haga ejercicio solamente como se lo haya indicado el mdico. Sentir clicos uterinos es un buen signo para detener la actividad fsica.  Contine comiendo alimentos sanos con  regularidad.  Use un sostn que le brinde buen soporte si le duelen las mamas.  No se d baos de inmersin en agua caliente, baos turcos ni saunas.  Use el cinturn de seguridad en todo momento mientras conduce.  No coma carne cruda ni queso sin cocinar; evite el contacto con las bandejas sanitarias de los gatos y la tierra que estos animales usan. Estos elementos contienen grmenes que pueden causar defectos congnitos en el beb.  Tome las vitaminas prenatales.  Tome entre 1500 y 2000mg de calcio diariamente comenzando en la semana20 del embarazo hasta el parto.  Si est estreida, pruebe un laxante suave (si el mdico lo autoriza). Consuma ms alimentos ricos en fibra, como vegetales y frutas frescos y cereales integrales. Beba gran cantidad de lquido para mantener la orina de tono claro o color amarillo plido.  Dese baos de asiento con agua tibia para aliviar el dolor o las molestias causadas por las hemorroides. Use una crema para las hemorroides si el mdico la autoriza.  Si tiene venas varicosas, use medias de descanso. Eleve los pies durante 15minutos, 3 o 4veces por da. Limite el consumo de sal en su dieta.  Evite levantar objetos pesados, use zapatos de tacones bajos y mantenga una buena postura.  Descanse   con las piernas elevadas si tiene calambres o dolor de cintura.  Visite a su dentista si no lo ha hecho durante el embarazo. Use un cepillo de dientes blando para higienizarse los dientes y psese el hilo dental con suavidad.  Puede seguir manteniendo relaciones sexuales, a menos que el mdico le indique lo contrario.  No haga viajes largos excepto que sea absolutamente necesario y solo con la autorizacin del mdico.  Tome clases prenatales para entender, practicar y hacer preguntas sobre el trabajo de parto y el parto.  Haga un ensayo de la partida al hospital.  Prepare el bolso que llevar al hospital.  Prepare la habitacin del beb.  Concurra a todas  las visitas prenatales segn las indicaciones de su mdico. SOLICITE ATENCIN MDICA SI:  No est segura de que est en trabajo de parto o de que ha roto la bolsa de las aguas.  Tiene mareos.  Siente clicos leves, presin en la pelvis o dolor persistente en el abdomen.  Tiene nuseas, vmitos o diarrea persistentes.  Observa una secrecin vaginal con mal olor.  Siente dolor al orinar. SOLICITE ATENCIN MDICA DE INMEDIATO SI:   Tiene fiebre.  Tiene una prdida de lquido por la vagina.  Tiene sangrado o pequeas prdidas vaginales.  Siente dolor intenso o clicos en el abdomen.  Sube o baja de peso rpidamente.  Tiene dificultad para respirar y siente dolor de pecho.  Sbitamente se le hinchan mucho el rostro, las manos, los tobillos, los pies o las piernas.  No ha sentido los movimientos del beb durante una hora.  Siente un dolor de cabeza intenso que no se alivia con medicamentos.  Su visin se modifica.   Esta informacin no tiene como fin reemplazar el consejo del mdico. Asegrese de hacerle al mdico cualquier pregunta que tenga.   Document Released: 07/27/2005 Document Revised: 11/07/2014 Elsevier Interactive Patient Education 2016 Elsevier Inc.  

## 2016-01-14 NOTE — Progress Notes (Signed)
ROB- doing well, encouraged enrolling in [facility], [facility], [facility].

## 2016-01-14 NOTE — Progress Notes (Signed)
ROB-blood consent signed, glucola done, tdap given, pt denies any complaints

## 2016-01-15 LAB — GLUCOSE, 1 HOUR GESTATIONAL: GESTATIONAL DIABETES SCREEN: 152 mg/dL — AB (ref 65–139)

## 2016-01-15 LAB — HEMATOCRIT: HEMATOCRIT: 38 % (ref 34.0–46.6)

## 2016-01-15 LAB — HEMOGLOBIN: HEMOGLOBIN: 12.5 g/dL (ref 11.1–15.9)

## 2016-01-19 ENCOUNTER — Other Ambulatory Visit: Payer: Self-pay | Admitting: Obstetrics and Gynecology

## 2016-01-19 DIAGNOSIS — O99810 Abnormal glucose complicating pregnancy: Secondary | ICD-10-CM

## 2016-01-25 ENCOUNTER — Telehealth: Payer: Self-pay | Admitting: *Deleted

## 2016-01-25 NOTE — Telephone Encounter (Signed)
-----   Message from Purcell NailsMelody N Shambley, [place] sent at 01/19/2016  3:25 PM EDT ----- See Baldwin Crownprev note

## 2016-01-25 NOTE — Telephone Encounter (Signed)
Notified [facility] pt failed glucose, they are going to call office back and let know when she can do 3 hr gtt

## 2016-01-26 ENCOUNTER — Other Ambulatory Visit: Payer: Self-pay | Admitting: Obstetrics and Gynecology

## 2016-01-26 ENCOUNTER — Ambulatory Visit (INDEPENDENT_AMBULATORY_CARE_PROVIDER_SITE_OTHER): Payer: Managed Care, Other (non HMO) | Admitting: Obstetrics and Gynecology

## 2016-01-26 ENCOUNTER — Encounter: Payer: Self-pay | Admitting: Obstetrics and Gynecology

## 2016-01-26 VITALS — BP 121/78 | HR 102 | Wt 221.6 lb

## 2016-01-26 DIAGNOSIS — O99810 Abnormal glucose complicating pregnancy: Secondary | ICD-10-CM

## 2016-01-26 DIAGNOSIS — Z331 Pregnant state, incidental: Secondary | ICD-10-CM

## 2016-01-26 LAB — POCT URINALYSIS DIPSTICK
Bilirubin, UA: NEGATIVE
Blood, UA: NEGATIVE
GLUCOSE UA: NEGATIVE
Ketones, UA: NEGATIVE
LEUKOCYTES UA: NEGATIVE
NITRITE UA: NEGATIVE
Protein, UA: NEGATIVE
Spec Grav, UA: <=1.005
UROBILINOGEN UA: 0.2
pH, UA: 6.5

## 2016-01-26 NOTE — Patient Instructions (Signed)
Levonorgestrel intrauterine device (IUD) What is this medicine? LEVONORGESTREL IUD (LEE voe nor jes trel) is a contraceptive (birth control) device. The device is placed inside the uterus by a healthcare professional. It is used to prevent pregnancy and can also be used to treat heavy bleeding that occurs during your period. Depending on the device, it can be used for 3 to 5 years. This medicine may be used for other purposes; ask your health care provider or pharmacist if you have questions. What should I tell my health care provider before I take this medicine? They need to know if you have any of these conditions: -abnormal Pap smear -cancer of the breast, uterus, or cervix -diabetes -endometritis -genital or pelvic infection now or in the past -have more than one sexual partner or your partner has more than one partner -heart disease -history of an ectopic or tubal pregnancy -immune system problems -IUD in place -liver disease or tumor -problems with blood clots or take blood-thinners -use intravenous drugs -uterus of unusual shape -vaginal bleeding that has not been explained -an unusual or allergic reaction to levonorgestrel, other hormones, silicone, or polyethylene, medicines, foods, dyes, or preservatives -pregnant or trying to get pregnant -breast-feeding How should I use this medicine? This device is placed inside the uterus by a health care professional. Talk to your pediatrician regarding the use of this medicine in children. Special care may be needed. Overdosage: If you think you have taken too much of this medicine contact a poison control center or emergency room at once. NOTE: This medicine is only for you. Do not share this medicine with others. What if I miss a dose? This does not apply. What may interact with this medicine? Do not take this medicine with any of the following medications: -amprenavir -bosentan -fosamprenavir This medicine may also interact with  the following medications: -aprepitant -barbiturate medicines for inducing sleep or treating seizures -bexarotene -griseofulvin -medicines to treat seizures like carbamazepine, ethotoin, felbamate, oxcarbazepine, phenytoin, topiramate -modafinil -pioglitazone -rifabutin -rifampin -rifapentine -some medicines to treat HIV infection like atazanavir, indinavir, lopinavir, nelfinavir, tipranavir, ritonavir -St. John's wort -warfarin This list may not describe all possible interactions. Give your health care provider a list of all the medicines, herbs, non-prescription drugs, or dietary supplements you use. Also tell them if you smoke, drink alcohol, or use illegal drugs. Some items may interact with your medicine. What should I watch for while using this medicine? Visit your doctor or health care professional for regular check ups. See your doctor if you or your partner has sexual contact with others, becomes HIV positive, or gets a sexual transmitted disease. This product does not protect you against HIV infection (AIDS) or other sexually transmitted diseases. You can check the placement of the IUD yourself by reaching up to the top of your vagina with clean fingers to feel the threads. Do not pull on the threads. It is a good habit to check placement after each menstrual period. Call your doctor right away if you feel more of the IUD than just the threads or if you cannot feel the threads at all. The IUD may come out by itself. You may become pregnant if the device comes out. If you notice that the IUD has come out use a backup birth control method like condoms and call your health care provider. Using tampons will not change the position of the IUD and are okay to use during your period. What side effects may I notice from receiving this medicine?   Side effects that you should report to your doctor or health care professional as soon as possible: -allergic reactions like skin rash, itching or  hives, swelling of the face, lips, or tongue -fever, flu-like symptoms -genital sores -high blood pressure -no menstrual period for 6 weeks during use -pain, swelling, warmth in the leg -pelvic pain or tenderness -severe or sudden headache -signs of pregnancy -stomach cramping -sudden shortness of breath -trouble with balance, talking, or walking -unusual vaginal bleeding, discharge -yellowing of the eyes or skin Side effects that usually do not require medical attention (report to your doctor or health care professional if they continue or are bothersome): -acne -breast pain -change in sex drive or performance -changes in weight -cramping, dizziness, or faintness while the device is being inserted -headache -irregular menstrual bleeding within first 3 to 6 months of use -nausea This list may not describe all possible side effects. Call your doctor for medical advice about side effects. You may report side effects to FDA at 1-800-FDA-1088. Where should I keep my medicine? This does not apply. NOTE: This sheet is a summary. It may not cover all possible information. If you have questions about this medicine, talk to your doctor, pharmacist, or health care provider.    2016, Elsevier/Gold Standard. (2011-11-17 13:54:04) How to Use Compression Stockings Compression stockings are elastic socks that squeeze the legs. They help to increase blood flow to the legs, decrease swelling in the legs, and reduce the chance of developing blood clots in the lower legs. Compression stockings are often used by people who:  Are recovering from surgery.  Have poor circulation in their legs.  Are prone to getting blood clots in their legs.  Have varicose veins.  Sit or stay in bed for long periods of time. HOW TO USE COMPRESSION STOCKINGS Before you put on your compression stockings:  Make sure that they are the correct size. If you do not know your size, ask your health care  provider.  Make sure that they are clean, dry, and in good condition.  Check them for rips and tears. Do not put them on if they are ripped or torn. Put your stockings on first thing in the morning, before you get out of bed. Keep them on for as long as your health care provider advises. When you are wearing your stockings:  Keep them as smooth as possible. Do not allow them to bunch up. It is especially important to prevent the stockings from bunching up around your toes or behind your knees.  Do not roll the stockings downward and leave them rolled down. This can decrease blood flow to your leg.  Change them right away if they become wet or dirty. When you take off your stockings, inspect your legs and feet. Anything that does not seem normal may require medical attention. Look for:  Open sores.  Red spots.  Swelling. INFORMATION AND TIPS  Do not stop wearing your compression stockings without talking to your health care provider first.  Wash your stockings everyday with mild detergent in cold or warm water. Do not use bleach. Air-dry your stockings or dry them in a clothes dryer on low heat.  Replace your stockings every 3-6 months.  If skin moisturizing is part of your treatment plan, apply lotion or cream at night so that your skin will be dry when you put on the stockings in the morning. It is harder to put the stockings on when you have lotion on your legs or  feet. SEEK MEDICAL CARE IF: Remove your stockings and seek medical care if:  You have a feeling of pins and needles in your feet or legs.  You have any new changes in your skin.  You have skin lesions that are getting worse.  You have swelling or pain that is getting worse. SEEK IMMEDIATE MEDICAL CARE IF:  You have numbness or tingling in your lower legs that does not get better immediately after you take the stockings off.  Your toes or feet become cold and blue.  You develop open sores or red spots on your  legs that do not go away.  You see or feel a warm spot on your leg.  You have new swelling or soreness in your leg.  You are short of breath or you have chest pain for no reason.  You have a rapid or irregular heartbeat.  You feel light-headed or dizzy.   This information is not intended to replace advice given to you by your health care provider. Make sure you discuss any questions you have with your health care provider.   Document Released: 08/14/2009 Document Revised: 03/03/2015 Document Reviewed: 09/24/2014 Elsevier Interactive Patient Education [employer]

## 2016-01-26 NOTE — Progress Notes (Signed)
ROB- pt is c/o B feet swelling, hands, also some tingling in her hands

## 2016-01-26 NOTE — Progress Notes (Signed)
ROB- doing 3h GTT today.

## 2016-01-27 ENCOUNTER — Telehealth: Payer: Self-pay | Admitting: Obstetrics and Gynecology

## 2016-01-27 ENCOUNTER — Other Ambulatory Visit: Payer: Self-pay | Admitting: Obstetrics and Gynecology

## 2016-01-27 DIAGNOSIS — O24419 Gestational diabetes mellitus in pregnancy, unspecified control: Secondary | ICD-10-CM

## 2016-01-27 LAB — GESTATIONAL GLUCOSE TOLERANCE
GLUCOSE FASTING: 95 mg/dL — AB (ref 65–94)
Glucose, GTT - 1 Hour: 148 mg/dL (ref 65–179)
Glucose, GTT - 2 Hour: 169 mg/dL — ABNORMAL HIGH (ref 65–154)
Glucose, GTT - 3 Hour: 147 mg/dL — ABNORMAL HIGH (ref 65–139)

## 2016-01-27 NOTE — Telephone Encounter (Signed)
Notified [facility] of lab results, they voiced understanding

## 2016-01-27 NOTE — Telephone Encounter (Signed)
-----   Message from Purcell NailsMelody N Shambley, [place] sent at 01/27/2016 10:32 AM EDT ----- Please let her know she has gestational diabetes, I will place referral for lifestyle center. Please send info in mail for her to review.

## 2016-01-27 NOTE — Telephone Encounter (Signed)
Notified pt of lab results, she voiced understanding.

## 2016-01-27 NOTE — Telephone Encounter (Signed)
Pt's husband called and wants you to call him at [phone] and explain the failing of the glucose because she didn't really understand, so he said to call him and then he will explain it to her.

## 2016-02-01 ENCOUNTER — Encounter: Payer: Managed Care, Other (non HMO) | Attending: Obstetrics and Gynecology | Admitting: Dietician

## 2016-02-01 VITALS — BP 116/74 | Ht 64.0 in | Wt 225.9 lb

## 2016-02-01 DIAGNOSIS — O24419 Gestational diabetes mellitus in pregnancy, unspecified control: Secondary | ICD-10-CM | POA: Diagnosis not present

## 2016-02-01 DIAGNOSIS — O24410 Gestational diabetes mellitus in pregnancy, diet controlled: Secondary | ICD-10-CM

## 2016-02-01 NOTE — Progress Notes (Signed)
Diabetes Self-Management Education  Visit Type: First/Initial  Appt. Start Time: 1600 Appt. End Time: 1730  02/01/2016  [name], identified by name and date of birth, is a 30 y.o. female with a diagnosis of Diabetes: Gestational Diabetes.   ASSESSMENT  Blood pressure 116/74, height  (1.626 m), weight 225 lb 14.4 oz (102.468 kg), last menstrual period 06/27/2015. Body mass index is 38.76 kg/(m^2).  Lacks knowledge of diabetes care and diet c/o occasional tingling in feet and hands c/o some swelling in feet and hands Is not testing BG's-does not have a BG meter     Diabetes Self-Management Education - 02/01/16 1746    Visit Information   Visit Type First/Initial   Initial Visit   Diabetes Type Gestational Diabetes   Health Coping   How would you rate your overall health? Good   Psychosocial Assessment   Patient Belief/Attitude about Diabetes Motivated to manage diabetes   Patient Concerns Glycemic Control;Weight Control;Healthy Lifestyle   Special Needs None   Preferred Learning Style Auditory   Learning Readiness Ready   What is the last grade level you completed in school? 12   Complications   How often do you check your blood sugar? 0 times/day (not testing)   Have you had a dilated eye exam in the past 12 months? Yes  3 years ago   Have you had a dental exam in the past 12 months? Yes  2 years ago   Are you checking your feet? Yes   How many days per week are you checking your feet? 7 (reports some swelling in feet)   Dietary Intake   Breakfast --  eats 3 meals/day-eats fried foods 4-5x/wk. and sweets 2-3x/wk   Snack (morning) --  eats 3 snacks/day-AM, PM and bedtime=eats fruit  5x/day   Beverage(s) --  drinks fruit juice 3x/day; drinks 8+ glasses of water/day; drinks 0-1 sugar sweetened coffee for breakfast; drinks 1 glass milk/day   Exercise   Exercise Type ADL's  has active job   Patient Education   Previous Diabetes Education No   Disease  state  --  discussed pathophysiology of GDM and treatment options   Nutrition management  Role of diet in the treatment of diabetes and the relationship between the three main macronutrients and blood glucose level;Food label reading, portion sizes and measuring food.;Carbohydrate counting   Physical activity and exercise  --  discussed importance of exercise to promote BG control-encouraged to walk 20-30 min at least 5x/wk if permitted by MD   Medications --  discussed medication options  used with GDM    Monitoring Taught/evaluated SMBG meter.;Purpose and frequency of SMBG.;Taught/discussed recording of test results and interpretation of SMBG.  gave pt Ultra One Touch 2 meter and instructed on its use-BG 102 ac supper   Acute complications Discussed and identified patients' treatment of hyperglycemia.   Chronic complications Relationship between chronic complications and blood glucose control   Psychosocial adjustment Role of stress on diabetes   Preconception care Pregnancy and GDM  Role of pre-pregnancy blood glucose control on the development of the fetus;Role of family planning for patients with diabetes;Reviewed with patient blood glucose goals with pregnancy   Personal strategies to promote health Lifestyle issues that need to be addressed for better diabetes care      Individualized Plan for Diabetes Self-Management Training:   Learning Objective:  Patient will have a greater understanding of diabetes self-management. Patient education plan is to attend individual and/or group sessions per assessed  needs and concerns.   Plan:   Patient Instructions  Appt. Start Time: 1600 Appt. End Time: 1730  GDM Class 1 Diabetes Overview - define DM; state own type of DM; identify functions of pancreas and insulin; define insulin deficiency vs insulin resistance  Psychosocial - identify DM as a source of stress; state the effects of stress on BG control; verbalize appropriate stress  management techniques; identify personal stress issues   Nutritional Management - describe effects of food on blood glucose; identify sources of carbohydrate, protein and fat; verbalize the importance of balance meals in controlling blood glucose; identify meals as well balanced or not; estimate servings of carbohydrate from menus; use food labels to identify servings size, content of carbohydrate, fiber, protein, fat, saturated fat and sodium; recognize food sources of fat, saturated fat, trans fat, sodium and verbalize goals for intake; describe healthful appropriate food choices when dining out   Exercise - describe the effects of exercise on blood glucose and importance of regular exercise in controlling diabetes; state a plan for personal exercise; verbalize contraindications for exercise  Medications - state name, dose, timing of currently prescribed medications; describe types of medications available for diabetes  Insulin Training - prepare and administer insulin accurately; state correct rotation pattern; verbalize safe and lawful needle disposal  Self-Monitoring - state importance of HBGM and demo procedure accurately; use HBGM results to effectively manage diabetes; identify importance of regular HbA1C testing and goals for results  Acute Complications/Sick Day Guidelines - recognize hyperglycemia and hypoglycemia with causes and effects; identify blood glucose results as high, low or in control; list steps in treating and preventing high and low blood glucose; state appropriate measure to manage blood glucose when ill (need for meds, HBGM plan, when to call physician, need for fluids)  Chronic Complications/Foot, Skin, Eye Dental Care - identify possible long-term complications of diabetes (retinopathy, neuropathy, nephropathy, cardiovascular disease, infections); explain steps in prevention and treatment of chronic complications; state importance of daily self-foot exams; describe how to  examine feet and what to look for; explain appropriate eye and dental care  Lifestyle Changes/Goals & Health/Community Resources - state benefits of making appropriate lifestyle changes; identify habits that need to change (meals, tobacco, alcohol); identify strategies to reduce risk factors for personal health; set goals for proper diabetes care; state need for and frequency of healthcare follow-up; describe appropriate community resources for good health (ADA, web sites, apps)   Pregnancy/Sexual Health - define gestational diabetes; state importance of good blood glucose control and birth control prior to pregnancy; state importance of good blood glucose control in preventing sexual problems (impotence, vaginal dryness, infections, loss of desire); state relationship of blood glucose control and pregnancy outcome; describe risk of maternal and fetal complications  Teaching Materials Used: Meter-Ultra One Touch 2 General Meal Planning Guidelines Daily Food Record Gestational Diabetes Booklet Gestational Video Goals for Healthy Pregnancy    Expected Outcomes:   positive  Education material provided: General Meal Planning guidelines, Ultra One Touch 2 meter, GDM booklet, GDM video  If problems or questions, patient to contact team via:  [phone]  Future DSME appointment:  02-11-16

## 2016-02-01 NOTE — Patient Instructions (Signed)
Appt. Start Time: 1600 Appt. End Time: 1730  GDM Class 1 Diabetes Overview - define DM; state own type of DM; identify functions of pancreas and insulin; define insulin deficiency vs insulin resistance  Psychosocial - identify DM as a source of stress; state the effects of stress on BG control; verbalize appropriate stress management techniques; identify personal stress issues   Nutritional Management - describe effects of food on blood glucose; identify sources of carbohydrate, protein and fat; verbalize the importance of balance meals in controlling blood glucose; identify meals as well balanced or not; estimate servings of carbohydrate from menus; use food labels to identify servings size, content of carbohydrate, fiber, protein, fat, saturated fat and sodium; recognize food sources of fat, saturated fat, trans fat, sodium and verbalize goals for intake; describe healthful appropriate food choices when dining out   Exercise - describe the effects of exercise on blood glucose and importance of regular exercise in controlling diabetes; state a plan for personal exercise; verbalize contraindications for exercise  Medications - state name, dose, timing of currently prescribed medications; describe types of medications available for diabetes  Insulin Training - prepare and administer insulin accurately; state correct rotation pattern; verbalize safe and lawful needle disposal  Self-Monitoring - state importance of HBGM and demo procedure accurately; use HBGM results to effectively manage diabetes; identify importance of regular HbA1C testing and goals for results  Acute Complications/Sick Day Guidelines - recognize hyperglycemia and hypoglycemia with causes and effects; identify blood glucose results as high, low or in control; list steps in treating and preventing high and low blood glucose; state appropriate measure to manage blood glucose when ill (need for meds, HBGM plan, when to call physician,  need for fluids)  Chronic Complications/Foot, Skin, Eye Dental Care - identify possible long-term complications of diabetes (retinopathy, neuropathy, nephropathy, cardiovascular disease, infections); explain steps in prevention and treatment of chronic complications; state importance of daily self-foot exams; describe how to examine feet and what to look for; explain appropriate eye and dental care  Lifestyle Changes/Goals & Health/Community Resources - state benefits of making appropriate lifestyle changes; identify habits that need to change (meals, tobacco, alcohol); identify strategies to reduce risk factors for personal health; set goals for proper diabetes care; state need for and frequency of healthcare follow-up; describe appropriate community resources for good health (ADA, web sites, apps)   Pregnancy/Sexual Health - define gestational diabetes; state importance of good blood glucose control and birth control prior to pregnancy; state importance of good blood glucose control in preventing sexual problems (impotence, vaginal dryness, infections, loss of desire); state relationship of blood glucose control and pregnancy outcome; describe risk of maternal and fetal complications  Teaching Materials Used: Meter-Ultra One Touch 2 General Meal Planning Guidelines Daily Food Record Gestational Diabetes Booklet Gestational Video Goals for Healthy Pregnancy

## 2016-02-03 ENCOUNTER — Other Ambulatory Visit: Payer: Self-pay | Admitting: *Deleted

## 2016-02-03 ENCOUNTER — Telehealth: Payer: Self-pay | Admitting: Obstetrics and Gynecology

## 2016-02-03 MED ORDER — GLUCOSE BLOOD VI STRP: ORAL_STRIP | Status: DC

## 2016-02-03 MED ORDER — ONETOUCH ULTRASOFT LANCETS MISC: Status: DC

## 2016-02-03 NOTE — Telephone Encounter (Signed)
Done-ac 

## 2016-02-03 NOTE — Telephone Encounter (Signed)
Patient needs a script for the ultra one touch test strips and one touch delta lancets. [facility] [place]Thanks

## 2016-02-11 ENCOUNTER — Encounter: Payer: Managed Care, Other (non HMO) | Admitting: Dietician

## 2016-02-11 ENCOUNTER — Encounter: Payer: Self-pay | Admitting: Dietician

## 2016-02-11 VITALS — BP 108/60 | Ht 64.0 in | Wt 222.7 lb

## 2016-02-11 DIAGNOSIS — O24419 Gestational diabetes mellitus in pregnancy, unspecified control: Secondary | ICD-10-CM | POA: Diagnosis not present

## 2016-02-11 DIAGNOSIS — O24410 Gestational diabetes mellitus in pregnancy, diet controlled: Secondary | ICD-10-CM

## 2016-02-11 NOTE — Progress Notes (Signed)
   Patient's BG record (viewed on her phone) indicates BGs are within goal ranges; FBGs are ranging 71-88, and post-meat BGs are ranging 71-112.   Patient's food intake (verbal recall) indicates balanced meals and controlled carbohydrate intake.    Provided 1800kcal meal plan, and wrote individualized menus based on patient's food preferences.  Instructed patient on food safety, including avoidance of Listeriosis, and limiting mercury from fish. She has been eating some imported cheese that might not be pasteurized; explained importance of eating only pasteurized cheese products during pregnancy.  Discussed importance of maintaining healthy lifestyle habits to reduce risk of Type 2 DM as well as Gestational DM with any future pregnancies.  Advised patient to use any remaining testing supplies to test some BGs after delivery, and to have BG tested ideally annually, as well as prior to attempting future pregnancies.

## 2016-02-17 ENCOUNTER — Encounter: Payer: Self-pay | Admitting: Obstetrics and Gynecology

## 2016-02-17 ENCOUNTER — Ambulatory Visit (INDEPENDENT_AMBULATORY_CARE_PROVIDER_SITE_OTHER): Payer: Managed Care, Other (non HMO) | Admitting: Obstetrics and Gynecology

## 2016-02-17 VITALS — BP 117/73 | HR 99 | Wt 221.6 lb

## 2016-02-17 DIAGNOSIS — Z331 Pregnant state, incidental: Secondary | ICD-10-CM

## 2016-02-17 LAB — POCT URINALYSIS DIPSTICK
Bilirubin, UA: NEGATIVE
Blood, UA: NEGATIVE
Glucose, UA: NEGATIVE
Ketones, UA: NEGATIVE
LEUKOCYTES UA: NEGATIVE
NITRITE UA: NEGATIVE
Protein, UA: NEGATIVE
SPEC GRAV UA: 1.015
UROBILINOGEN UA: 0.2
pH, UA: 6.5

## 2016-02-17 MED ORDER — GLUCOSE BLOOD VI STRP: ORAL_STRIP | Status: DC

## 2016-02-17 NOTE — Progress Notes (Signed)
ROB- doing well, FBS 78-88, PP 70-126 with only one over >120 (had a soda), OK to only check FBS and one PP, growth scan next week. And again at 38 weeks, plans to stop work at 37 weeks- will give work note next visit.

## 2016-02-17 NOTE — Progress Notes (Signed)
ROB-pt is c/o pelvic pressure 

## 2016-02-24 ENCOUNTER — Other Ambulatory Visit: Payer: Self-pay | Admitting: Obstetrics and Gynecology

## 2016-02-24 DIAGNOSIS — O26843 Uterine size-date discrepancy, third trimester: Secondary | ICD-10-CM

## 2016-02-26 ENCOUNTER — Ambulatory Visit (INDEPENDENT_AMBULATORY_CARE_PROVIDER_SITE_OTHER): Payer: Managed Care, Other (non HMO)

## 2016-02-26 DIAGNOSIS — O26843 Uterine size-date discrepancy, third trimester: Secondary | ICD-10-CM

## 2016-03-01 ENCOUNTER — Ambulatory Visit (INDEPENDENT_AMBULATORY_CARE_PROVIDER_SITE_OTHER): Payer: Managed Care, Other (non HMO) | Admitting: Obstetrics and Gynecology

## 2016-03-01 ENCOUNTER — Encounter: Payer: Self-pay | Admitting: Obstetrics and Gynecology

## 2016-03-01 VITALS — BP 124/84 | HR 98 | Wt 228.9 lb

## 2016-03-01 DIAGNOSIS — Z331 Pregnant state, incidental: Secondary | ICD-10-CM

## 2016-03-01 LAB — POCT URINALYSIS DIPSTICK
Bilirubin, UA: NEGATIVE
GLUCOSE UA: NEGATIVE
Ketones, UA: NEGATIVE
LEUKOCYTES UA: NEGATIVE
NITRITE UA: NEGATIVE
PROTEIN UA: NEGATIVE
SPEC GRAV UA: 1.015
UROBILINOGEN UA: 0.2
pH, UA: 6.5

## 2016-03-01 NOTE — Progress Notes (Signed)
ROB- pt c/o pelvic pressure, she is tired

## 2016-03-01 NOTE — Progress Notes (Signed)
ROB-doing good, having heartburn nightly, to add zantac

## 2016-03-01 NOTE — Patient Instructions (Signed)
Contracciones de Physiological scientist (Braxton Hicks Contractions) Durante el [place], pueden presentarse contracciones uterinas que no siempre indican que est en [place].  QU SON LAS CONTRACCIONES DE BRAXTON HICKS?  Las [employer] se presentan antes del [place] de [place] se conocen como contracciones de [place] o falso trabajo de [place]. Hacia el final del embarazo (32 a 34semanas), estas contracciones pueden aparecen con ms frecuencia y volverse ms intensas. No corresponden al [place] de parto verdadero porque estas contracciones no producen el agrandamiento (la dilatacin) y el afinamiento del cuello del tero. Algunas veces, es difcil distinguirlas del trabajo de parto verdadero porque en algunos casos pueden ser [employer], y las personas tienen diferentes niveles de tolerancia al [employer]. No debe sentirse avergonzada si concurre al hospital con falso trabajo de [place]. En ocasiones, la nica forma de saber si el trabajo de parto es verdadero es que el mdico determine si hay cambios en el cuello del tero. Si no hay problemas prenatales u otras complicaciones de salud asociadas con el embarazo, no habr inconvenientes si la envan a su casa con falso trabajo de parto y espera que comience el verdadero. [place] DEL VERDADERO Falso trabajo de parto  Las contracciones del falso trabajo de parto duran menos y no son tan intensas como las verdaderas.  Generalmente son irregulares.  A menudo, se sienten en la parte delantera de la parte baja del abdomen y en la ingle,  y pueden desaparecer cuando camina o cambia de posicin mientras est acostada.  Las contracciones se vuelven ms dbiles y su duracin es Garment/textile technologist a medida que el tiempo transcurre.  Por lo general, no se hacen progresivamente ms intensas, regulares y Magazine features editor entre s como en el caso del [place] de parto verdadero. Antionette Fairy de parto  Las contracciones del verdadero  trabajo de parto duran de 48 a 70segundos, son muy regulares y suelen volverse ms intensas, y [place] su frecuencia.  No desaparecen cuando camina.  La molestia generalmente se siente en la parte superior del tero y se extiende hacia la zona inferior del abdomen y [employer] cintura.  El mdico podr examinarla para determinar si el trabajo de parto es verdadero. El examen mostrar si el cuello del tero se est dilatando y [place]. LO QUE DEBE RECORDAR  Contine haciendo los ejercicios habituales y siga otras indicaciones que el mdico le d.  Tome todos los medicamentos como le indic el mdico.  Consulting civil engineer a las visitas prenatales regulares.  Coma y beba con moderacin si cree que est en trabajo de parto.  Si las contracciones de [employer] provocan incomodidad:  Cambie de posicin: si est acostada o descansando, camine; si est caminando, descanse.  Sintese y descanse en una baera con agua tibia.  Beba 2 o 3vasos de [place]. La deshidratacin puede provocar contracciones.  Respire lenta y profundamente varias veces por hora. [place]? Solicite atencin mdica de inmediato si:  Las contracciones se intensifican, se hacen ms regulares y Industrial/product designer s.  Tiene una prdida de lquido por la vagina.  Tiene fiebre.  Elimina mucosidad manchada con [place].  Tiene una hemorragia vaginal abundante.  Tiene dolor abdominal permanente.  Tiene un dolor en la zona lumbar que nunca tuvo antes.  Siente que la cabeza del beb empuja hacia abajo y ejerce presin en la zona plvica.  El beb no se mueve Administrator, Civil Service.   Esta informacin no tiene como fin  reemplazar el consejo del mdico. Asegrese de hacerle al mdico cualquier pregunta que tenga.   Document Released: 07/27/2005 Document Revised: 10/22/2013 Elsevier Interactive Patient Education 2016 [employer].   Acidez de estmago durante el embarazo  (Heartburn During  Pregnancy) Jettie Booze es la sensacin de ardor en el pecho que se siente cuando el cido del estmago vuelve haca el esfago. La acidez es frecuente en el embarazo debido a la liberacin de cierta hormona (progesterona). La progesterona relaja la vlvula que separa el esfago del [place]. Esto hace que el cido suba al esfago y cause acidez. Tambin puede ocurrir Visual merchandiser debido a que el tero al agrandarse empuja el estmago, lo que hace que suba ms cido al esfago. Esto se produce especialmente en las ltimas etapas del embarazo. La acidez generalmente desaparece despus del parto. CAUSAS  La acidez se siente cuando el cido del estmago vuelve hacia el esfago. Durante el [place], puede ser causada por distintas cosas, por ejemplo:   La hormona progesterona.  Cambios en los niveles hormonales.  El tero crece y [address] cido del estmago [place].  Comidas abundantes  Ciertos alimentos y [address] fsica.  Aumento en la produccin de cido SIGNOS Y SNTOMAS   Sensacin de ardor en el pecho o en la parte inferior de la garganta.  Sabor amargo en la boca.  Tos. DIAGNSTICO  El mdico diagnostica la [place] con una historia clnica cuidadosa en la que pregunta por sus molestias. Le indicar anlisis de sangre para Clinical research associate cierto tipo de bacteria que se asocia con la [place]. En algunos casos se diagnostica recetando un medicamento para calmar la acidez y viendo si los sntomas mejoran. En algunos casos, se realiza un procedimiento llamado endoscopa. En este procedimiento se [place] un tubo con Neomia Dear luz y Posey Boyer en un extremo (endoscopio) , y se examina el esfago y Investment banker, corporate. TRATAMIENTO  El tratamiento variar segn la gravedad de los sntomas. El mdico podr indicar:  Medicamentos de Sales promotion account executive (anticidos, medicamentos para Conservator, museum/gallery Engineering geologist) en los casos de acidez leve.  Medicamentos recetados para disminuir el cido estomacal o para proteger la  superficie del [place].  Ciertos cambios en la dieta.  Elevacin de la cabecera de la cama colocando bloques debajo de las patas. De esta manera evitar que el cido del estmago vuelva al esfago mientras est recostado. INSTRUCCIONES PARA EL CUIDADO EN EL HOGAR   Tome slo medicamentos de venta libre o recetados, segn las indicaciones del mdico.  Eleve la cabecera de la cama colocando bloques debajo de las patas, si el mdico lo aconsej. Usar ms almohadas al dormir no es Secretary/administrator porque solo modificara la posicin de la cabeza.  No  haga ejercicios enseguida despus de comer.  Evite comer 2 o 3horas antes de irse a dormir. No se acueste enseguida despus de comer.  Haga comidas pequeas Freight forwarder de tres comidas abundantes.  Identifique los alimentos o las bebidas que empeoran sus sntomas y evtelos. Los alimentos que debe evitar son:  [place].  Chocolate.  Alimentos con alto contenido de grasas, incluyendo las comidas fritas  Comidas muy condimentadas.  Ajo y [address]  Ctricos, como naranja, pomelo, limn y lima  Alimentos o productos que contengan tomate  Menta.  Bebidas gaseosas y con cafena.  [place] SOLICITE ATENCIN MDICA SI:  Tiene cualquier tipo de dolor abdominal.  Siente ardor en la parte superior del abdomen o el pecho, especialmente despus de comer  o mientras est acostada.  Tiene nuseas o vmitos.  Siente malestar estomacal despus de comer. SOLICITE ATENCIN MDICA DE INMEDIATO SI:   Siente un dolor intenso en el pecho que baja por el brazo o va hacia al mandbula o el cuello.  Se siente mareado o sufre un desmayo.  Comienza a sentir falta de aire.  Vomita sangre.  Tiene dificultad o dolor al tragar.  La materia fecal es negra, de aspecto alquitranado.  Tiene acidez ms de 3 veces por semana, durante ms de 2 semanas. ASEGRESE DE QUE:  Comprende estas instrucciones.  Controlar su afeccin.  Recibir ayuda  de inmediato si no mejora o si empeora.   Esta informacin no tiene Theme park managercomo fin reemplazar el consejo del mdico. Asegrese de hacerle al mdico cualquier pregunta que tenga.   Document Released: 07/27/2005 Document Revised: 11/07/2014 Elsevier Interactive Patient Education [employer]2016 Elsevier Inc.

## 2016-03-07 ENCOUNTER — Other Ambulatory Visit: Payer: Self-pay | Admitting: *Deleted

## 2016-03-07 DIAGNOSIS — Z331 Pregnant state, incidental: Secondary | ICD-10-CM

## 2016-03-07 DIAGNOSIS — Z3685 Encounter for antenatal screening for Streptococcus B: Secondary | ICD-10-CM

## 2016-03-07 DIAGNOSIS — Z113 Encounter for screening for infections with a predominantly sexual mode of transmission: Secondary | ICD-10-CM

## 2016-03-08 ENCOUNTER — Ambulatory Visit (INDEPENDENT_AMBULATORY_CARE_PROVIDER_SITE_OTHER): Payer: Managed Care, Other (non HMO) | Admitting: Obstetrics and Gynecology

## 2016-03-08 VITALS — BP 126/84 | HR 99 | Wt 231.9 lb

## 2016-03-08 DIAGNOSIS — Z331 Pregnant state, incidental: Secondary | ICD-10-CM

## 2016-03-08 DIAGNOSIS — Z3685 Encounter for antenatal screening for Streptococcus B: Secondary | ICD-10-CM

## 2016-03-08 DIAGNOSIS — Z36 Encounter for antenatal screening of mother: Secondary | ICD-10-CM

## 2016-03-08 DIAGNOSIS — Z113 Encounter for screening for infections with a predominantly sexual mode of transmission: Secondary | ICD-10-CM

## 2016-03-08 LAB — POCT URINALYSIS DIPSTICK
Bilirubin, UA: NEGATIVE
Glucose, UA: NEGATIVE
KETONES UA: NEGATIVE
Leukocytes, UA: NEGATIVE
Nitrite, UA: NEGATIVE
PROTEIN UA: NEGATIVE
RBC UA: NEGATIVE
SPEC GRAV UA: 1.01
Urobilinogen, UA: 0.2
pH, UA: 6.5

## 2016-03-08 NOTE — Progress Notes (Signed)
ROB- cultures obtained, pt request note for work

## 2016-03-08 NOTE — Progress Notes (Signed)
ROB- cultures obtained, labor precautions discussed. 

## 2016-03-08 NOTE — Patient Instructions (Signed)
Contracciones de Braxton Hicks °(Braxton Hicks Contractions) °Durante el embarazo, pueden presentarse contracciones uterinas que no siempre indican que está en trabajo de parto.  °¿QUÉ SON LAS CONTRACCIONES DE BRAXTON HICKS?  °Las contracciones que se presentan antes del trabajo de parto se conocen como contracciones de Braxton Hicks o falso trabajo de parto. Hacia el final del embarazo (32 a 34 semanas), estas contracciones pueden aparecen con más frecuencia y volverse más intensas. No corresponden al trabajo de parto verdadero porque estas contracciones no producen el agrandamiento (la dilatación) y el afinamiento del cuello del útero. Algunas veces, es difícil distinguirlas del trabajo de parto verdadero porque en algunos casos pueden ser muy intensas, y las personas tienen diferentes niveles de tolerancia al dolor. No debe sentirse avergonzada si concurre al hospital con falso trabajo de parto. En ocasiones, la única forma de saber si el trabajo de parto es verdadero es que el médico determine si hay cambios en el cuello del útero. °Si no hay problemas prenatales u otras complicaciones de salud asociadas con el embarazo, no habrá inconvenientes si la envían a su casa con falso trabajo de parto y espera que comience el verdadero. °CÓMO DIFERENCIAR EL TRABAJO DE PARTO FALSO DEL VERDADERO °Falso trabajo de parto °· Las contracciones del falso trabajo de parto duran menos y no son tan intensas como las verdaderas. °· Generalmente son irregulares. °· A menudo, se sienten en la parte delantera de la parte baja del abdomen y en la ingle, °· y pueden desaparecer cuando camina o cambia de posición mientras está acostada. °· Las contracciones se vuelven más débiles y su duración es menor a medida que el tiempo transcurre. °· Por lo general, no se hacen progresivamente más intensas, regulares y cercanas entre sí como en el caso del trabajo de parto verdadero. °Verdadero trabajo de parto °· Las contracciones del verdadero  trabajo de parto duran de 30 a 70 segundos, son muy regulares y suelen volverse más intensas, y aumenta su frecuencia. °· No desaparecen cuando camina. °· La molestia generalmente se siente en la parte superior del útero y se extiende hacia la zona inferior del abdomen y hacia la cintura. °· El médico podrá examinarla para determinar si el trabajo de parto es verdadero. El examen mostrará si el cuello del útero se está dilatando y afinando. °LO QUE DEBE RECORDAR °· Continúe haciendo los ejercicios habituales y siga otras indicaciones que el médico le dé. °· Tome todos los medicamentos como le indicó el médico. °· Concurra a las visitas prenatales regulares. °· Coma y beba con moderación si cree que está en trabajo de parto. °· Si las contracciones de Braxton Hicks le provocan incomodidad: °¨ Cambie de posición: si está acostada o descansando, camine; si está caminando, descanse. °¨ Siéntese y descanse en una bañera con agua tibia. °¨ Beba 2 o 3 vasos de agua. La deshidratación puede provocar contracciones. °¨ Respire lenta y profundamente varias veces por hora. °¿CUÁNDO DEBO BUSCAR ASISTENCIA MÉDICA INMEDIATA? °Solicite atención médica de inmediato si: °· Las contracciones se intensifican, se hacen más regulares y cercanas entre sí. °· Tiene una pérdida de líquido por la vagina. °· Tiene fiebre. °· Elimina mucosidad manchada con sangre. °· Tiene una hemorragia vaginal abundante. °· Tiene dolor abdominal permanente. °· Tiene un dolor en la zona lumbar que nunca tuvo antes. °· Siente que la cabeza del bebé empuja hacia abajo y ejerce presión en la zona pélvica. °· El bebé no se mueve tanto como solía. °  °Esta información no tiene como fin   reemplazar el consejo del médico. Asegúrese de hacerle al médico cualquier pregunta que tenga. °  °Document Released: 07/27/2005 Document Revised: 10/22/2013 °Elsevier Interactive Patient Education ©2016 Elsevier Inc. ° °

## 2016-03-09 LAB — GC/CHLAMYDIA PROBE AMP
CHLAMYDIA, DNA PROBE: NEGATIVE
NEISSERIA GONORRHOEAE BY PCR: NEGATIVE

## 2016-03-10 ENCOUNTER — Other Ambulatory Visit: Payer: Self-pay | Admitting: Obstetrics and Gynecology

## 2016-03-10 DIAGNOSIS — O99820 Streptococcus B carrier state complicating pregnancy: Secondary | ICD-10-CM | POA: Insufficient documentation

## 2016-03-10 LAB — STREP GP B NAA: STREP GROUP B AG: POSITIVE — AB

## 2016-03-17 ENCOUNTER — Encounter: Payer: Self-pay | Admitting: Obstetrics and Gynecology

## 2016-03-17 ENCOUNTER — Ambulatory Visit (INDEPENDENT_AMBULATORY_CARE_PROVIDER_SITE_OTHER): Payer: Managed Care, Other (non HMO) | Admitting: Obstetrics and Gynecology

## 2016-03-17 VITALS — BP 101/69 | HR 94 | Wt 234.8 lb

## 2016-03-17 DIAGNOSIS — Z3493 Encounter for supervision of normal pregnancy, unspecified, third trimester: Secondary | ICD-10-CM

## 2016-03-17 LAB — POCT URINALYSIS DIPSTICK
Glucose, UA: NEGATIVE
Ketones, UA: NEGATIVE
LEUKOCYTES UA: NEGATIVE
NITRITE UA: NEGATIVE
PH UA: 6
Spec Grav, UA: 1.01
Urobilinogen, UA: 0.2

## 2016-03-17 NOTE — H&P (Signed)
Andrea Ellis is a 30 y.o. female presenting for IOl due to diet controlled GDM. History OB History    Gravida Para Term Preterm AB TAB SAB Ectopic Multiple Living   1              Past Medical History  Diagnosis Date  . Headache    Past Surgical History  Procedure Laterality Date  . Knee surgery     Family History: family history includes Alcohol abuse in her father. Social History:  reports that she has never smoked. She has never used smokeless tobacco. She reports that she does not drink alcohol or use illicit drugs.   Prenatal Transfer Tool  Maternal Diabetes: Yes:  Diabetes Type:  Diet controlled Genetic Screening: Normal Maternal Ultrasounds/Referrals: Normal Fetal Ultrasounds or other Referrals:  None Maternal Substance Abuse:  No Significant Maternal Medications:  None Significant Maternal Lab Results:  None Other Comments:  None  ROS  Dilation: 1 Effacement (%): 60 Station: -3 Blood pressure 101/69, pulse 94, weight 234 lb 12.8 oz (106.505 kg), last menstrual period 06/27/2015. Exam Physical Exam  A&O x4  well groomed obeses female in no distress Abdomen gravid and nontender HRR Lungs clear 1+ non-pitting edema pedal bilateral Prenatal labs: ABO, Rh: AB/Positive/-- (12/05 1023) Antibody: Negative (12/05 1023) Rubella: 5.15 (12/05 1023) RPR: Non Reactive (12/05 1023)  HBsAg: Negative (12/05 1023)  HIV: Non Reactive (12/05 1023)  GBS: Positive (05/09 1119)   Assessment/Plan: IOL secondary to GDM per protocol GBS prophylaxis   Melody N Gayla Medicus, CNM 03/17/2016, 4:49 PM

## 2016-03-17 NOTE — Progress Notes (Signed)
ROB-reviewed GBS+ and treatment in labor, will plan IOL on 03/30/16  If not delivered by then.  FBS all <95 and PP all <112.

## 2016-03-17 NOTE — Patient Instructions (Signed)
(Group B Streptococcus Infection During Pregnancy) El estreptococo del grupo B (GBS, por sus siglas en ingls) es un tipo de bacteria que se encuentra en las mujeres sanas. No es el mismo que la bacteria que causa faringitis estreptoccica. Puede tener estreptococo del grupoB en la vagina, el recto o la vejiga. El estreptococo del grupoB no se transmite por contacto sexual; sin embargo, un beb puede contagiarse durante el parto, lo que puede ser peligroso para el nio. El estreptococo del grupoB no es peligroso para usted y, generalmente, no provoca sntomas. El mdico puede hacerle anlisis de deteccin del estreptococo del grupoB entre la semana 35 y la 37de embarazo. Esta bacteria es peligrosa solamente durante el parto, de modo que no es necesario hacer pruebas de deteccin con anterioridad. Es posible tener estreptococo del grupoB durante el embarazo y no transmitrselo nunca al beb. Si los resultados de los anlisis de deteccin del estreptococo del grupoB son positivos, el mdico puede recomendar que se le administren antibiticos durante el parto para asegurarse de que el beb est sano. FACTORES DE RIESGO Tiene ms probabilidades de transmitirle el estreptococo del grupoB al beb si:   Se le rompe la bolsa de aguas (ruptura de membranas) o si el trabajo de parto comienza antes de las 37semanas.  Se le rompe la bolsa 18horas antes del parto.  Transmiti el estreptococo del grupoB en un embarazo anterior.  Tiene una infeccin de las vas urinarias causada por el estreptococo del grupoB en cualquier momento durante el embarazo.  Tiene fiebre durante el trabajo de parto. SNTOMAS La mayora de las mujeres que tienen estreptococo del grupoB no presentan sntomas. Si tiene una infeccin de las vas urinarias causada por el estreptococo del grupoB, es posible que orine con frecuencia o que tenga dolor al orinar y fiebre. Generalmente, los bebs que tienen estreptococo del grupoB  presentan sntomas en el trmino de los 7das posteriores al nacimiento. Los sntomas pueden incluir:   Problemas respiratorios.  Problemas cardacos y de presin arterial.  Problemas digestivos y renales. DIAGNSTICO Se recomienda que todas las embarazadas se hagan exmenes de rutina para la deteccin del estreptococo del grupoB. El mdico toma una muestra de secrecin de la vagina y el recto con un hisopo, que luego se enva al laboratorio para detectar la presencia de estreptococo del grupoB. Tambin pueden tomarle una muestra de orina para controlar si hay bacterias.  TRATAMIENTO Si el resultado de los anlisis de deteccin del estreptococo del grupoB es positivo, tal vez deba recibir tratamiento con un antibitico durante el trabajo de parto. En cuanto comience el trabajo de parto o se produzca la ruptura de las membranas, recibir el antibitico a travs de una va intravenosa. Seguir recibiendo este medicamento hasta despus del parto. No necesita antibiticos si el parto es por cesrea. Si el beb muestra signos o sntomas de estreptococo del grupoB despus del parto, tambin puede recibir tratamiento con antibiticos. INSTRUCCIONES PARA EL CUIDADO EN EL HOGAR   Tome todos los medicamentos tal como el mdico le recet. Tome los medicamentos solamente como se lo hayan indicado.  Contine con las visitas y cuidados prenatales.  Cumpla con todas las visitas de control. SOLICITE ATENCIN MDICA SI:   Siente dolor al orinar.  Tiene necesidad de orinar con frecuencia.  Tiene fiebre. SOLICITE ATENCIN MDICA DE INMEDIATO SI:   Se produce la ruptura de las membranas.  Comienza el trabajo de parto.   Esta informacin no tiene como fin reemplazar el consejo del mdico. Asegrese   de hacerle al mdico cualquier pregunta que tenga.   Document Released: 04/04/2008 Document Revised: 10/22/2013 Elsevier Interactive Patient Education 2016 Elsevier Inc.  

## 2016-03-17 NOTE — Progress Notes (Signed)
ROB- pt is having a lot of pelvic pressure, some pressure in her rectum

## 2016-03-18 ENCOUNTER — Inpatient Hospital Stay
Admission: EM | Admit: 2016-03-18 | Discharge: 2016-03-21 | DRG: 775 | Disposition: A | Discharge status: Home / Self Care | Payer: Managed Care, Other (non HMO) | Attending: Obstetrics and Gynecology | Admitting: Obstetrics and Gynecology

## 2016-03-18 DIAGNOSIS — O24420 Gestational diabetes mellitus in childbirth, diet controlled: Secondary | ICD-10-CM | POA: Diagnosis present

## 2016-03-18 DIAGNOSIS — O99824 Streptococcus B carrier state complicating childbirth: Secondary | ICD-10-CM | POA: Diagnosis present

## 2016-03-18 DIAGNOSIS — Z3A37 37 weeks gestation of pregnancy: Secondary | ICD-10-CM

## 2016-03-18 DIAGNOSIS — O4290 Premature rupture of membranes, unspecified as to length of time between rupture and onset of labor, unspecified weeks of gestation: Secondary | ICD-10-CM

## 2016-03-18 DIAGNOSIS — Z3403 Encounter for supervision of normal first pregnancy, third trimester: Secondary | ICD-10-CM | POA: Diagnosis not present

## 2016-03-18 DIAGNOSIS — O99810 Abnormal glucose complicating pregnancy: Secondary | ICD-10-CM

## 2016-03-18 DIAGNOSIS — O24419 Gestational diabetes mellitus in pregnancy, unspecified control: Secondary | ICD-10-CM

## 2016-03-18 DIAGNOSIS — O99820 Streptococcus B carrier state complicating pregnancy: Secondary | ICD-10-CM

## 2016-03-18 LAB — CBC
HCT: 37.1 % (ref 35.0–47.0)
HEMOGLOBIN: 12.5 g/dL (ref 12.0–16.0)
MCH: 25.1 pg — ABNORMAL LOW (ref 26.0–34.0)
MCHC: 33.6 g/dL (ref 32.0–36.0)
MCV: 74.7 fL — ABNORMAL LOW (ref 80.0–100.0)
PLATELETS: 306 10*3/uL (ref 150–440)
RBC: 4.97 MIL/uL (ref 3.80–5.20)
RDW: 16.1 % — ABNORMAL HIGH (ref 11.5–14.5)
WBC: 11.5 10*3/uL — AB (ref 3.6–11.0)

## 2016-03-18 LAB — GLUCOSE, CAPILLARY: GLUCOSE-CAPILLARY: 98 mg/dL (ref 65–99)

## 2016-03-18 MED ORDER — FENTANYL CITRATE (PF) 100 MCG/2ML IJ SOLN
50.0000 ug | INTRAMUSCULAR | Status: DC | PRN
  Administered 2016-03-19 (×3): 50 ug via INTRAVENOUS
  Filled 2016-03-18 (×2): qty 2

## 2016-03-18 MED ORDER — OXYTOCIN BOLUS FROM INFUSION: 500.0000 mL | INTRAVENOUS | Status: DC

## 2016-03-18 MED ORDER — OXYTOCIN 40 UNITS IN LACTATED RINGERS INFUSION - SIMPLE MED
1.0000 m[IU]/min | INTRAVENOUS | Status: DC
  Administered 2016-03-19 (×2): 1 m[IU]/min via INTRAVENOUS
  Filled 2016-03-18 (×2): qty 1000

## 2016-03-18 MED ORDER — TERBUTALINE SULFATE 1 MG/ML IJ SOLN: 0.2500 mg | Freq: Once | INTRAMUSCULAR | Status: DC | PRN

## 2016-03-18 MED ORDER — LACTATED RINGERS IV SOLN
500.0000 mL | INTRAVENOUS | Status: DC | PRN
  Administered 2016-03-19: 500 mL via INTRAVENOUS

## 2016-03-18 MED ORDER — MISOPROSTOL 50MCG HALF TABLET
50.0000 ug | ORAL_TABLET | ORAL | Status: DC
  Filled 2016-03-18 (×7): qty 1

## 2016-03-18 MED ORDER — ACETAMINOPHEN 325 MG PO TABS
650.0000 mg | ORAL_TABLET | ORAL | Status: DC | PRN
Start: 2016-03-18 — End: 2016-03-20

## 2016-03-18 MED ORDER — ONDANSETRON HCL 4 MG/2ML IJ SOLN: 4.0000 mg | Freq: Four times a day (QID) | INTRAMUSCULAR | Status: DC | PRN

## 2016-03-18 MED ORDER — SODIUM CHLORIDE 0.9 % IV SOLN
2.0000 g | INTRAVENOUS | Status: AC
  Administered 2016-03-19 (×5): 2 g via INTRAVENOUS
  Filled 2016-03-18 (×5): qty 2000

## 2016-03-18 MED ORDER — CITRIC ACID-SODIUM CITRATE 334-500 MG/5ML PO SOLN: 30.0000 mL | ORAL | Status: DC | PRN

## 2016-03-18 MED ORDER — OXYTOCIN 40 UNITS IN LACTATED RINGERS INFUSION - SIMPLE MED: 2.5000 [IU]/h | INTRAVENOUS | Status: DC

## 2016-03-18 MED ORDER — LACTATED RINGERS IV SOLN
INTRAVENOUS | Status: DC
  Administered 2016-03-18 – 2016-03-19 (×3): via INTRAVENOUS

## 2016-03-18 MED ORDER — LIDOCAINE HCL (PF) 1 % IJ SOLN: 30.0000 mL | INTRAMUSCULAR | Status: DC | PRN

## 2016-03-18 NOTE — H&P (Signed)
Obstetric History and Physical  Andrea Ellis Eskelson is a 30 y.o. G1P0 with IUP at 5733w6d presenting with leaking fluid since 7pm. Patient states she has been having  irregular, every 10 minutes contractions, none vaginal bleeding, ruptured, clear fluid, sterile speculum exam copious fluid with + fern membranes, with active fetal movement.    [facility]  Pregnancy complications or risks:GDM-diet controlled  Prenatal labs and studies: ABO, Rh: AB/Positive/-- (12/05 1023) Antibody: Negative (12/05 1023) Rubella: 5.15 (12/05 1023) RPR: Non Reactive (12/05 1023)  HBsAg: Negative (12/05 1023)  HIV: Non Reactive (12/05 1023)  EAV:WUJWJXBJGBS:Positive (05/09 1119) 1 hr Glucola  elevated Genetic screening normal Anatomy US normal  Past Medical History  Diagnosis Date  . Headache     Past Surgical History  Procedure Laterality Date  . Knee surgery      OB History  Gravida Para Term Preterm AB SAB TAB Ectopic Multiple Living  1             # Outcome Date GA Lbr Len/2nd Weight Sex Delivery Anes PTL Lv  1 Current               Social History   Social History  . Marital Status: Single    Spouse Name: N/A  . Number of Children: N/A  . Years of Education: N/A   Occupational History  . manufactoring    Social History Main Topics  . Smoking status: Never Smoker   . Smokeless tobacco: Never Used  . Alcohol Use: No  . Drug Use: No  . Sexual Activity:    Partners: Male    Birth Control/ Protection: None   Other Topics Concern  . None   Social History Narrative    Family History  Problem Relation Age of Onset  . Alcohol abuse Father     Prescriptions prior to admission  Medication Sig Dispense Refill Last Dose  . glucose blood test strip Use as instructed 100 each 12 Taking  . glucose blood test strip Use as instructed (Patient not taking: Reported on 03/17/2016) 100 each 12 Not Taking  . Prenatal Vit-Fe Fumarate-FA (PRENATAL VITAMINS PLUS) 27-1 MG TABS  Take 1 tablet by mouth daily. 30 tablet 9 Taking    No Known Allergies  Review of Systems: Negative except for what is mentioned in HPI.  Physical Exam: Ht 5\' 4"  (1.626 m)  LMP 06/27/2015 GENERAL: Well-developed, well-nourished obese female in no acute distress.  LUNGS: Clear to auscultation bilaterally.  HEART: Regular rate and rhythm. ABDOMEN: Soft, nontender, nondistended, gravid. EXTREMITIES: Nontender, no edema, 2+ distal pulses. Cervical Exam:  1.5/50/-3 posterior with suspected OP FHT:  Baseline rate 135 bpm   Variability moderate  Accelerations present   Decelerations none Contractions: Every 6-10 mins   Pertinent Labs/Studies:   No results found for this or any previous visit (from the past 24 hour(s)).  Assessment : Andrea Ellis Labrum is a 30 y.o. G1P0 at 2933w6d being admitted for labor.  Plan: Labor: Expectant management.  Induction/Augmentation as needed, per protocol FWB: Reassuring fetal heart tracing.  GBS positive Delivery plan: Hopeful for vaginal delivery  Chereese Cilento DenverShambley, CNM (Dr Valentino Saxonherry aware of admission) [facility]

## 2016-03-18 NOTE — OB Triage Note (Signed)
Pt. states she has been leaking a small; amount of fluid off and on for several days. She reports occasional contractions. She was last seen in the office yesterday; her cervix was 1cm at that time. She denies vaginal bleeding and endorses good fetal movement.

## 2016-03-19 ENCOUNTER — Inpatient Hospital Stay: Payer: Managed Care, Other (non HMO) | Admitting: Anesthesiology

## 2016-03-19 ENCOUNTER — Encounter: Payer: Self-pay | Admitting: Anesthesiology

## 2016-03-19 LAB — GLUCOSE, CAPILLARY
GLUCOSE-CAPILLARY: 93 mg/dL (ref 65–99)
GLUCOSE-CAPILLARY: 94 mg/dL (ref 65–99)
GLUCOSE-CAPILLARY: 90 mg/dL (ref 65–99)
GLUCOSE-CAPILLARY: 69 mg/dL (ref 65–99)
Glucose-Capillary: 80 mg/dL (ref 65–99)
Glucose-Capillary: 99 mg/dL (ref 65–99)

## 2016-03-19 MED ORDER — SODIUM CHLORIDE 0.9% FLUSH: 3.0000 mL | INTRAVENOUS | Status: DC | PRN

## 2016-03-19 MED ORDER — NALBUPHINE HCL 10 MG/ML IJ SOLN: 5.0000 mg | INTRAMUSCULAR | Status: DC | PRN

## 2016-03-19 MED ORDER — LIDOCAINE HCL (PF) 1 % IJ SOLN
INTRAMUSCULAR | Status: AC
  Filled 2016-03-19: qty 30

## 2016-03-19 MED ORDER — FENTANYL 2.5 MCG/ML W/ROPIVACAINE 0.2% IN NS 100 ML EPIDURAL INFUSION (ARMC-ANES): 10.0000 mL/h | EPIDURAL | Status: DC

## 2016-03-19 MED ORDER — NALBUPHINE HCL 10 MG/ML IJ SOLN: 5.0000 mg | Freq: Once | INTRAMUSCULAR | Status: DC | PRN

## 2016-03-19 MED ORDER — AMMONIA AROMATIC IN INHA
RESPIRATORY_TRACT | Status: DC
Start: 2016-03-19 — End: 2016-03-20
  Filled 2016-03-19: qty 10

## 2016-03-19 MED ORDER — MISOPROSTOL 200 MCG PO TABS
ORAL_TABLET | ORAL | Status: AC
  Filled 2016-03-19: qty 4

## 2016-03-19 MED ORDER — DIPHENHYDRAMINE HCL 50 MG/ML IJ SOLN: 12.5000 mg | INTRAMUSCULAR | Status: DC | PRN

## 2016-03-19 MED ORDER — NALOXONE HCL 0.4 MG/ML IJ SOLN: 0.4000 mg | INTRAMUSCULAR | Status: DC | PRN

## 2016-03-19 MED ORDER — LIDOCAINE-EPINEPHRINE (PF) 1.5 %-1:200000 IJ SOLN
INTRAMUSCULAR | Status: DC | PRN
  Administered 2016-03-19: 2 mL via PERINEURAL
  Administered 2016-03-19: 3 mL via PERINEURAL

## 2016-03-19 MED ORDER — NALOXONE HCL 2 MG/2ML IJ SOSY
1.0000 ug/kg/h | PREFILLED_SYRINGE | INTRAVENOUS | Status: DC | PRN
  Filled 2016-03-19: qty 2

## 2016-03-19 MED ORDER — FENTANYL 2.5 MCG/ML W/ROPIVACAINE 0.2% IN NS 100 ML EPIDURAL INFUSION (ARMC-ANES)
EPIDURAL | Status: AC
  Administered 2016-03-19: 10 mL/h via EPIDURAL
  Filled 2016-03-19: qty 100

## 2016-03-19 MED ORDER — OXYTOCIN 10 UNIT/ML IJ SOLN
INTRAMUSCULAR | Status: AC
  Filled 2016-03-19: qty 2

## 2016-03-19 MED ORDER — BUPIVACAINE HCL (PF) 0.25 % IJ SOLN
INTRAMUSCULAR | Status: DC | PRN
  Administered 2016-03-19: 5 mL via EPIDURAL

## 2016-03-19 MED ORDER — DIPHENHYDRAMINE HCL 25 MG PO CAPS: 25.0000 mg | ORAL_CAPSULE | ORAL | Status: DC | PRN

## 2016-03-19 NOTE — Progress Notes (Signed)
Andrea Ellis is a 30 y.o. G1P0 at 7676w0d by LMP admitted for rupture of membranes  Subjective: Not feeling pain with current epidural  Objective: BP 98/53 mmHg  Pulse 76  Temp(Src) 98.5 F (36.9 C) (Oral)  Resp 16  Ht 5\' 4"  (1.626 m)  Wt 234 lb (106.142 kg)  BMI 40.15 kg/m2  SpO2 97%  LMP 06/27/2015 I/O last 3 completed shifts: In: -  Out: 350 [Urine:350]    FHT:  FHR: 128 bpm, variability: moderate,  accelerations:  Present,  decelerations:  Absent UC:   regular, every 2-4 minutes SVE:   Dilation: 10 Effacement (%): 100 Station: +1 Exam by:: ess, RN  Labs: Lab Results  Component Value Date   WBC 11.5* 03/18/2016   HGB 12.5 03/18/2016   HCT 37.1 03/18/2016   MCV 74.7* 03/18/2016   PLT 306 03/18/2016    Assessment / Plan: Augmentation of labor, progressing well  Labor: Progressing normally Preeclampsia:  labs stable Fetal Wellbeing:  Category I Pain Control:  Epidural I/D:  n/a Anticipated MOD:  NSVD  Shayma Pfefferle Suzan Nailer Nanea Jared, CNM 03/19/2016, 11:39 PM

## 2016-03-19 NOTE — Progress Notes (Signed)
Andrea BarrierJeaneth Ellis is a 30 y.o. G1P0 at 5123w0d by LMP admitted for rupture of membranes  Subjective: Denies pain or pressure sensations, resting well Objective: BP 123/76 mmHg  Pulse 93  Temp(Src) 98 F (36.7 C) (Oral)  Resp 16  Ht 5\' 4"  (1.626 m)  Wt 234 lb (106.142 kg)  BMI 40.15 kg/m2  SpO2 97%  LMP 06/27/2015 I/O last 3 completed shifts: In: -  Out: 350 [Urine:350]    FHT:  FHR: 140 bpm, variability: moderate,  accelerations:  Present,  decelerations:  Present variable decels after epidural placed with low maternal blood pressure, resolved with position changes and IVF bolus UC:   irregular, every 1-4 minutes, with 180-190 MVU for last hour SVE:   Dilation: 5.5 Effacement (%): 80 Station: -1 Exam by:: JPB  Labs: Lab Results  Component Value Date   WBC 11.5* 03/18/2016   HGB 12.5 03/18/2016   HCT 37.1 03/18/2016   MCV 74.7* 03/18/2016   PLT 306 03/18/2016    Assessment / Plan: Spontaneous labor, progressing normally  Labor: Progressing normally Preeclampsia:  labs stable Fetal Wellbeing:  Category I Pain Control:  Epidural I/D:  n/a Anticipated MOD:  NSVD  Emylia Latella NIKE Panzy Bubeck, CNM 03/19/2016, 7:26 PM

## 2016-03-19 NOTE — Anesthesia Procedure Notes (Addendum)
Epidural Patient location during procedure: OB Start time: 03/19/2016 4:00 PM End time: 03/19/2016 4:40 PM  Staffing Resident/CRNA: Clovis FredricksonRISSON, Sally Reimers  Preanesthetic Checklist Completed: patient identified, site marked, pre-op evaluation, timeout performed, IV checked, risks and benefits discussed and monitors and equipment checked  Epidural Patient position: sitting Prep: Betadine Patient monitoring: heart rate, cardiac monitor, continuous pulse ox and blood pressure Approach: midline Location: L3-L4 Injection technique: LOR air  Needle:  Needle type: Tuohy  Needle gauge: 18 G Needle length: 9 cm Needle insertion depth: 7 cm Catheter type: closed end flexible Catheter size: 19 Gauge Catheter at skin depth: 12 cm Test dose: negative and 1.5% lidocaine with Epi 1:200 K  Assessment Sensory level: T8 Events: blood not aspirated, injection not painful, injection resistance and no paresthesia  Additional Notes Patient tolerated procedure well.  Reason for block:procedure for pain

## 2016-03-19 NOTE — Progress Notes (Signed)
Andrea Ellis is a 30 y.o. G1P0 at 273w0d by LMP admitted for rupture of membranes  Subjective: Reports pain with contractions, but tolerating it well.  Objective: BP 121/81 mmHg  Pulse 88  Temp(Src) 98.4 F (36.9 C) (Oral)  Resp 16  Ht 5\' 4"  (1.626 m)  Wt 234 lb (106.142 kg)  BMI 40.15 kg/m2  LMP 06/27/2015      FHT:  FHR: 155 bpm, variability: moderate,  accelerations:  Present,  decelerations:  Absent UC:   irregular, every 1-3 minutes, moderate to palpation SVE:   Dilation: 4 Effacement (%): 70 Station: -2 Exam by:: m.Andrea Ellis cnm Vertex ROP presentation confirmed with ultrasound.  Labs: Lab Results  Component Value Date   WBC 11.5* 03/18/2016   HGB 12.5 03/18/2016   HCT 37.1 03/18/2016   MCV 74.7* 03/18/2016   PLT 306 03/18/2016    Assessment / Plan: Spontaneous labor, progressing normally  Labor: Progressing normally Preeclampsia:  labs stable Fetal Wellbeing:  Category I Pain Control:  IV pain meds I/D:  n/a Anticipated MOD:  NSVD  Andrea Ellis, CNM 03/19/2016, 10:36 AM

## 2016-03-19 NOTE — Progress Notes (Signed)
Vernell BarrierJeaneth Adderly is a 30 y.o. G1P0 at 3478w0d by LMP admitted for rupture of membranes  Subjective: Reports lower pelvic and low back pain with contractions  Objective: BP 120/57 mmHg  Pulse 73  Temp(Src) 98.8 F (37.1 C) (Oral)  Resp 16  Ht 5\' 4"  (1.626 m)  Wt 234 lb (106.142 kg)  BMI 40.15 kg/m2  LMP 06/27/2015      FHT:  FHR: 148 bpm, variability: moderate,  accelerations:  Present,  decelerations:  Absent UC:   irregular, every 2-4 minutes, moderate to palpation-IUPC placed SVE:   Dilation: 5 Effacement (%): 80 Station: -1 Exam by:: JPB RN  Labs: Lab Results  Component Value Date   WBC 11.5* 03/18/2016   HGB 12.5 03/18/2016   HCT 37.1 03/18/2016   MCV 74.7* 03/18/2016   PLT 306 03/18/2016    Assessment / Plan: Protracted active phase will restart pitocin  Labor: SROM and labor with slow progress Preeclampsia:  labs stable Fetal Wellbeing:  Category I Pain Control:  IV pain meds I/D:  n/a Anticipated MOD:  NSVD  Melody N Shambley, CNM 03/19/2016, 2:40 PM

## 2016-03-19 NOTE — Anesthesia Preprocedure Evaluation (Addendum)
Anesthesia Evaluation  Patient identified by MRN, date of birth, ID band Patient awake    History of Anesthesia Complications Negative for: history of anesthetic complications  Airway Mallampati: III  TM Distance: >3 FB     Dental no notable dental hx.    Pulmonary neg pulmonary ROS,           Cardiovascular negative cardio ROS       Neuro/Psych  Headaches, negative psych ROS   GI/Hepatic negative GI ROS, Neg liver ROS,   Endo/Other  diabetes, Well Controlled, GestationalMorbid obesity  Renal/GU negative Renal ROS  negative genitourinary   Musculoskeletal negative musculoskeletal ROS (+)   Abdominal   Peds negative pediatric ROS (+)  Hematology negative hematology ROS (+)   Anesthesia Other Findings Past Medical History:   Headache                                                    BMI    Body Mass Index   40.14 kg/m 2      Reproductive/Obstetrics (+) Pregnancy                            Anesthesia Physical Anesthesia Plan  ASA: III  Anesthesia Plan: Epidural   Post-op Pain Management:    Induction:   Airway Management Planned:   Additional Equipment:   Intra-op Plan:   Post-operative Plan:   Informed Consent: I have reviewed the patients History and Physical, chart, labs and discussed the procedure including the risks, benefits and alternatives for the proposed anesthesia with the patient or authorized representative who has indicated his/her understanding and acceptance.     Plan Discussed with: CRNA and Anesthesiologist  Anesthesia Plan Comments:        Anesthesia Quick Evaluation

## 2016-03-20 DIAGNOSIS — Z3403 Encounter for supervision of normal first pregnancy, third trimester: Secondary | ICD-10-CM

## 2016-03-20 LAB — GLUCOSE, CAPILLARY
Glucose-Capillary: 96 mg/dL (ref 65–99)
Glucose-Capillary: 82 mg/dL (ref 65–99)

## 2016-03-20 LAB — CBC
HEMATOCRIT: 33.7 % — AB (ref 35.0–47.0)
Hemoglobin: 11.1 g/dL — ABNORMAL LOW (ref 12.0–16.0)
MCH: 25.2 pg — ABNORMAL LOW (ref 26.0–34.0)
MCHC: 32.8 g/dL (ref 32.0–36.0)
MCV: 76.8 fL — AB (ref 80.0–100.0)
PLATELETS: 251 10*3/uL (ref 150–440)
RBC: 4.39 MIL/uL (ref 3.80–5.20)
RDW: 16.4 % — ABNORMAL HIGH (ref 11.5–14.5)
WBC: 21.5 10*3/uL — AB (ref 3.6–11.0)

## 2016-03-20 LAB — RPR: RPR Ser Ql: NONREACTIVE

## 2016-03-20 MED ORDER — IBUPROFEN 600 MG PO TABS
600.0000 mg | ORAL_TABLET | Freq: Four times a day (QID) | ORAL | Status: DC
Start: 2016-03-20 — End: 2016-03-21
  Administered 2016-03-20 – 2016-03-21 (×4): 600 mg via ORAL
  Filled 2016-03-20 (×4): qty 1

## 2016-03-20 MED ORDER — ACETAMINOPHEN 325 MG PO TABS: 650.0000 mg | ORAL_TABLET | ORAL | Status: DC | PRN

## 2016-03-20 MED ORDER — DIBUCAINE 1 % RE OINT: 1.0000 "application " | TOPICAL_OINTMENT | RECTAL | Status: DC | PRN

## 2016-03-20 MED ORDER — DOCUSATE SODIUM 100 MG PO CAPS
100.0000 mg | ORAL_CAPSULE | Freq: Two times a day (BID) | ORAL | Status: DC
  Administered 2016-03-20 – 2016-03-21 (×2): 100 mg via ORAL
  Filled 2016-03-20 (×2): qty 1

## 2016-03-20 MED ORDER — SENNOSIDES-DOCUSATE SODIUM 8.6-50 MG PO TABS: 2.0000 | ORAL_TABLET | ORAL | Status: DC

## 2016-03-20 MED ORDER — IBUPROFEN 600 MG PO TABS
600.0000 mg | ORAL_TABLET | Freq: Four times a day (QID) | ORAL | Status: DC
  Administered 2016-03-20: 600 mg via ORAL
  Filled 2016-03-20: qty 1

## 2016-03-20 MED ORDER — OXYCODONE HCL 5 MG PO TABS: 10.0000 mg | ORAL_TABLET | ORAL | Status: DC | PRN

## 2016-03-20 MED ORDER — WITCH HAZEL-GLYCERIN EX PADS: 1.0000 "application " | MEDICATED_PAD | CUTANEOUS | Status: DC | PRN

## 2016-03-20 MED ORDER — ZOLPIDEM TARTRATE 5 MG PO TABS: 5.0000 mg | ORAL_TABLET | Freq: Every evening | ORAL | Status: DC | PRN

## 2016-03-20 MED ORDER — BENZOCAINE-MENTHOL 20-0.5 % EX AERO: 1.0000 "application " | INHALATION_SPRAY | CUTANEOUS | Status: DC | PRN

## 2016-03-20 MED ORDER — OXYCODONE HCL 5 MG PO TABS
5.0000 mg | ORAL_TABLET | ORAL | Status: DC | PRN
  Administered 2016-03-20: 5 mg via ORAL
  Filled 2016-03-20: qty 1

## 2016-03-20 MED ORDER — PRENATAL MULTIVITAMIN CH
1.0000 | ORAL_TABLET | Freq: Every day | ORAL | Status: DC
  Administered 2016-03-20 – 2016-03-21 (×2): 1 via ORAL
  Filled 2016-03-20 (×2): qty 1

## 2016-03-20 MED ORDER — COCONUT OIL OIL: 1.0000 "application " | TOPICAL_OIL | Status: DC | PRN

## 2016-03-20 MED ORDER — ONDANSETRON HCL 4 MG PO TABS: 4.0000 mg | ORAL_TABLET | ORAL | Status: DC | PRN

## 2016-03-20 MED ORDER — ONDANSETRON HCL 4 MG/2ML IJ SOLN
4.0000 mg | INTRAMUSCULAR | Status: DC | PRN
Start: 2016-03-20 — End: 2016-03-21

## 2016-03-20 MED ORDER — SIMETHICONE 80 MG PO CHEW: 80.0000 mg | CHEWABLE_TABLET | ORAL | Status: DC | PRN

## 2016-03-20 MED ORDER — DIPHENHYDRAMINE HCL 25 MG PO CAPS: 25.0000 mg | ORAL_CAPSULE | Freq: Four times a day (QID) | ORAL | Status: DC | PRN

## 2016-03-20 NOTE — Anesthesia Postprocedure Evaluation (Signed)
Anesthesia Post Note  Patient: Andrea Ellis  Procedure(s) Performed: * No procedures listed *  Patient location during evaluation: Mother Baby Anesthesia Type: Epidural Level of consciousness: awake and alert Pain management: pain level controlled Vital Signs Assessment: post-procedure vital signs reviewed and stable Respiratory status: spontaneous breathing, nonlabored ventilation, respiratory function stable and patient connected to nasal cannula oxygen Cardiovascular status: blood pressure returned to baseline and stable Postop Assessment: no signs of nausea or vomiting Anesthetic complications: no    Last Vitals:  Filed Vitals:   03/20/16 0832 03/20/16 1301  BP: 127/59 109/57  Pulse: 92 109  Temp: 36.9 C 37.2 C  Resp: 18 18    Last Pain:  Filed Vitals:   03/20/16 1302  PainSc: 0-No pain                 Lenard SimmerAndrew Isbella Arline

## 2016-03-21 NOTE — Care Management (Signed)
Received referral for wanting more information about Medicaid and [facility]. Discussed calling the Department of Social Services and the Health Department to arrange an appointment to start each of these applications. Hard copy of telephone numbers and resources given to Ms. Kathrene Alurellana. Nurse updated. Gwenette GreetBrenda S Lynsi Dooner RN MSN CCM Care Management [phone]

## 2016-03-21 NOTE — Discharge Summary (Signed)
Obstetric Discharge Summary Reason for Admission: onset of labor Prenatal Procedures: NST and ultrasound Intrapartum Procedures: spontaneous vaginal delivery, GBS prophylaxis and 2nd degree laceration repair Postpartum Procedures: none Complications-Operative and Postpartum: 2nd degree perineal laceration HEMOGLOBIN  Date Value Ref Range Status  03/20/2016 11.1* 12.0 - 16.0 g/dL Final   HCT  Date Value Ref Range Status  03/20/2016 33.7* 35.0 - 47.0 % Final   HEMATOCRIT  Date Value Ref Range Status  01/14/2016 38.0 34.0 - 46.6 % Final    Physical Exam:  General: alert, cooperative, appears stated age, fatigued and mildly obese Lochia: appropriate Uterine Fundus: firm Incision: NA DVT Evaluation: Negative Homan's sign.  Discharge Diagnoses: Term Pregnancy-delivered and GBS prophylaxis, GDM  Discharge Information: Date: 03/21/2016 Activity: pelvic rest Diet: routine Medications: PNV, Ibuprofen and Colace Condition: stable Instructions: refer to practice specific booklet Discharge to: home, Plans Mirena IUD PP   Newborn Data: Live born female "Genisis" Birth Weight: 7 lb 7.2 oz (3380 g) APGAR: 6, 9  Home with mother.  Melody N Shambley 03/21/2016, 11:15 AM

## 2016-03-21 NOTE — Progress Notes (Signed)
Discharge instructions provided.  Pt and sig other refuse interpreter.  Pt and sig other verbalize understanding of all instructions and follow-up care.  Pt discharged to home with infant at 1820 on 03/21/16 via wheelchair by volunteer. Reynold BowenSusan Paisley Brison Fiumara, RN 03/21/2016 6:36 PM

## 2016-03-25 ENCOUNTER — Encounter: Payer: Managed Care, Other (non HMO) | Admitting: Obstetrics and Gynecology

## 2016-03-25 ENCOUNTER — Other Ambulatory Visit: Payer: Managed Care, Other (non HMO)

## 2016-04-01 ENCOUNTER — Encounter: Payer: Managed Care, Other (non HMO) | Admitting: Obstetrics and Gynecology

## 2016-05-05 ENCOUNTER — Encounter: Payer: Self-pay | Admitting: Obstetrics and Gynecology

## 2016-05-05 ENCOUNTER — Ambulatory Visit (INDEPENDENT_AMBULATORY_CARE_PROVIDER_SITE_OTHER): Payer: Managed Care, Other (non HMO) | Admitting: Obstetrics and Gynecology

## 2016-05-05 MED ORDER — LEVONORGESTREL 20 MCG/24HR IU IUD: 1.0000 | INTRAUTERINE_SYSTEM | Freq: Once | INTRAUTERINE | Status: DC

## 2016-05-05 NOTE — Progress Notes (Signed)
  Subjective:     Andrea Ellis is a 30 y.o. female who presents for a postpartum visit. She is 6 weeks postpartum following a spontaneous vaginal delivery. I have fully reviewed the prenatal and intrapartum course. The delivery was at 39 gestational weeks. Outcome: spontaneous vaginal delivery. Anesthesia: local. Postpartum course has been uncomplicated. Baby's course has been uncomplicated. Baby is feeding by formula only. Bleeding no bleeding. Bowel function is normal. Bladder function is normal. Patient is not sexually active. Contraception method is abstinence. Postpartum depression screening: negative.  The following portions of the patient's history were reviewed and updated as appropriate: allergies, current medications, past family history, past medical history, past social history, past surgical history and problem list.  Review of Systems Pertinent items are noted in HPI.   Objective:    BP 122/76 mmHg  Pulse 72  Ht 5\' 4"  (1.626 m)  Wt 211 lb (95.709 kg)  BMI 36.20 kg/m2  Breastfeeding? No  General:  alert, cooperative, appears stated age and moderately obese   Breasts:  not examined  Lungs: clear to auscultation bilaterally and normal percussion bilaterally  Heart:  regular rate and rhythm, S1, S2 normal, no murmur, click, rub or gallop  Abdomen: soft, non-tender; bowel sounds normal; no masses,  no organomegaly   Vulva:  normal  Vagina: normal vagina, no discharge, exudate, lesion, or erythema  Cervix:  multiparous appearance  Corpus: normal size, contour, position, consistency, mobility, non-tender  Adnexa:  no mass, fullness, tenderness  Rectal Exam: Not performed.        Assessment:     6 weeks postpartum exam. Pap smear not done at today's visit.   Plan:    1. Contraception: IUD 2. UPT- 3. Follow up in: 2 months for AE or as needed.       Andrea Ellis is a 30 y.o. year old [id number] Hispanic female who presents for placement of a Mirena IUD.  No LMP  recorded. BP 122/76 mmHg  Pulse 72  Ht 5\' 4"  (1.626 m)  Wt 211 lb (95.709 kg)  BMI 36.20 kg/m2  Breastfeeding? No Last sexual intercourse was before delivery, and pregnancy test today was negative  The risks and benefits of the method and placement have been thouroughly reviewed with the patient and all questions were answered.  Specifically the patient is aware of failure rate of 10/998, expulsion of the IUD and of possible perforation.  The patient is aware of irregular bleeding due to the method and understands the incidence of irregular bleeding diminishes with time.  Signed copy of informed consent in chart.   Time out was performed.  A graves speculum was placed in the vagina.  The cervix was visualized, prepped using Betadine, and grasped with a single tooth tenaculum. The uterus was found to be neutral and it sounded to 8 cm.  Mirena IUD placed per manufacturer's recommendations.   The strings were trimmed to 3 cm.   The patient was given post procedure instructions, including signs and symptoms of infection and to check for the strings after each menses or each month, and refraining from intercourse or anything in the vagina for 3 days.  She was given a Mirena care card with date Mirena placed, and date Mirena to be removed.    Jamya Starry Suzan NailerN Anapaula Severt, CNM

## 2016-05-05 NOTE — Patient Instructions (Signed)
  Place postpartum visit patient instructions here.  

## 2016-06-20 ENCOUNTER — Ambulatory Visit (INDEPENDENT_AMBULATORY_CARE_PROVIDER_SITE_OTHER): Payer: Managed Care, Other (non HMO) | Admitting: Family Medicine

## 2016-06-20 ENCOUNTER — Encounter: Payer: Self-pay | Admitting: Family Medicine

## 2016-06-20 ENCOUNTER — Other Ambulatory Visit: Payer: Managed Care, Other (non HMO)

## 2016-06-20 VITALS — BP 122/77 | HR 77 | Temp 98.6°F | Ht 63.6 in | Wt 209.0 lb

## 2016-06-20 DIAGNOSIS — M5431 Sciatica, right side: Secondary | ICD-10-CM | POA: Diagnosis not present

## 2016-06-20 DIAGNOSIS — L259 Unspecified contact dermatitis, unspecified cause: Secondary | ICD-10-CM | POA: Diagnosis not present

## 2016-06-20 MED ORDER — METHOCARBAMOL 500 MG PO TABS: 500.0000 mg | ORAL_TABLET | Freq: Four times a day (QID) | ORAL | 1 refills | Status: DC

## 2016-06-20 MED ORDER — NAPROXEN 500 MG PO TABS: 500.0000 mg | ORAL_TABLET | Freq: Two times a day (BID) | ORAL | 1 refills | Status: DC

## 2016-06-20 MED ORDER — TRIAMCINOLONE ACETONIDE 0.5 % EX OINT: 1.0000 "application " | TOPICAL_OINTMENT | Freq: Two times a day (BID) | CUTANEOUS | 0 refills | Status: DC

## 2016-06-20 NOTE — Progress Notes (Signed)
BP 122/77 (BP Location: Left Arm, Patient Position: Sitting, Cuff Size: Large)   Pulse 77   Temp 98.6 F (37 C)   Ht 5' 3.6" (1.615 m)   Wt 209 lb (94.8 kg)   SpO2 99%   BMI 36.33 kg/m    Subjective:    Patient ID: Andrea Ellis, female    DOB: 1986/08/23, 30 y.o.   MRN: [id number]  HPI: Andrea Ellis is a 30 y.o. female  Chief Complaint  Patient presents with  . Hip Pain    right  . Foot Pain    right  . Other    Patient has noticed brusing on her knees without any injury   Pain stayed the same when she was pregnant. Got worse when she started work again. Started work again about 2 months ago. Has been working at at [employer] where she has been standing and bending most of the time.  Hurts in her R hip and into her R leg down to the foot. Comes and goes. Worse when she is standing at work. + numbness, no tingling. Has done PT in the past for 3-4x, but has done it 2x in the past. Learned exercises, and has been doing them, but they are not helping.   3 weeks after going back to work- scratches on her skin, but didn't scratch herself. Raised welts. No itching. No hurting. Hasn't gone away in several weeks. Concerned about it.    Relevant past medical, surgical, family and social history reviewed and updated as indicated. Interim medical history since our last visit reviewed. Allergies and medications reviewed and updated.  Review of Systems  Constitutional: Negative.   Respiratory: Negative.   Cardiovascular: Negative.   Musculoskeletal: Positive for back pain and myalgias. Negative for arthralgias, gait problem, joint swelling, neck pain and neck stiffness.  Psychiatric/Behavioral: Negative.     Per HPI unless specifically indicated above     Objective:    BP 122/77 (BP Location: Left Arm, Patient Position: Sitting, Cuff Size: Large)   Pulse 77   Temp 98.6 F (37 C)   Ht 5' 3.6" (1.615 m)   Wt 209 lb (94.8 kg)   SpO2 99%   BMI 36.33 kg/m   Wt Readings  from Last 3 Encounters:  06/20/16 209 lb (94.8 kg)  05/05/16 211 lb (95.7 kg)  03/18/16 234 lb (106.1 kg)    Physical Exam  Constitutional: She is oriented to person, place, and time. She appears well-developed and well-nourished. No distress.  HENT:  Head: Normocephalic and atraumatic.  Right Ear: Hearing normal.  Left Ear: Hearing normal.  Nose: Nose normal.  Eyes: Conjunctivae and lids are normal. Right eye exhibits no discharge. Left eye exhibits no discharge. No scleral icterus.  Pulmonary/Chest: Effort normal. No respiratory distress.  Neurological: She is alert and oriented to person, place, and time.  Skin: Skin is warm, dry and intact. Rash noted. She is not diaphoretic. No erythema. No pallor.  Streaks of erythematous raised lines on lateral aspect of R knee  Psychiatric: She has a normal mood and affect. Her speech is normal and behavior is normal. Judgment and thought content normal. Cognition and memory are normal.  Nursing note and vitals reviewed. Back Exam:    Inspection:  Normal spinal curvature.  No deformity, ecchymosis, erythema, or lesions     Palpation:     Midline spinal tenderness: no      Paralumbar tenderness: no      Parathoracic tenderness: no  Buttocks tenderness: yesRight     Range of Motion:      Flexion: Fingers to Mid-[place]     Extension:Decreased     Lateral bending:Decreased    Rotation:Decreased    Neuro Exam:Lower extremity DTRs normal & symmetric.  Strength and sensation intact.    Special Tests:      Straight leg raise:negative   Results for orders placed or performed during the hospital encounter of 03/18/16  CBC  Result Value Ref Range   WBC 11.5 (H) 3.6 - 11.0 K/uL   RBC 4.97 3.80 - 5.20 MIL/uL   Hemoglobin 12.5 12.0 - 16.0 g/dL   HCT 16.137.1 09.635.0 - 04.547.0 %   MCV 74.7 (L) 80.0 - 100.0 fL   MCH 25.1 (L) 26.0 - 34.0 pg   MCHC 33.6 32.0 - 36.0 g/dL   RDW 40.916.1 (H) 81.111.5 - 91.414.5 %   Platelets 306 150 - 440 K/uL  RPR  Result Value Ref  Range   RPR Ser Ql Non Reactive Non Reactive  Glucose, capillary  Result Value Ref Range   Glucose-Capillary 98 65 - 99 mg/dL  Glucose, capillary  Result Value Ref Range   Glucose-Capillary 80 65 - 99 mg/dL  Glucose, capillary  Result Value Ref Range   Glucose-Capillary 99 65 - 99 mg/dL  Glucose, capillary  Result Value Ref Range   Glucose-Capillary 93 65 - 99 mg/dL  Glucose, capillary  Result Value Ref Range   Glucose-Capillary 94 65 - 99 mg/dL  Glucose, capillary  Result Value Ref Range   Glucose-Capillary 90 65 - 99 mg/dL  Glucose, capillary  Result Value Ref Range   Glucose-Capillary 69 65 - 99 mg/dL  Glucose, capillary  Result Value Ref Range   Glucose-Capillary 96 65 - 99 mg/dL  CBC  Result Value Ref Range   WBC 21.5 (H) 3.6 - 11.0 K/uL   RBC 4.39 3.80 - 5.20 MIL/uL   Hemoglobin 11.1 (L) 12.0 - 16.0 g/dL   HCT 78.233.7 (L) 95.635.0 - 21.347.0 %   MCV 76.8 (L) 80.0 - 100.0 fL   MCH 25.2 (L) 26.0 - 34.0 pg   MCHC 32.8 32.0 - 36.0 g/dL   RDW 08.616.4 (H) 57.811.5 - 46.914.5 %   Platelets 251 150 - 440 K/uL  Glucose, capillary  Result Value Ref Range   Glucose-Capillary 82 65 - 99 mg/dL      Assessment & Plan:   Problem List Items Addressed This Visit    None    Visit Diagnoses    Sciatica of right side    -  Primary   No better with PT x2 or exercises. No better with flexeril and ibuprofen. 2+ years. Will start methocarbamol and naproxen. Will refer to sports med. FU 3 weeks   Relevant Medications   methocarbamol (ROBAXIN) 500 MG tablet   Other Relevant Orders   Ambulatory referral to Sports Medicine   Contact dermatitis       Will treat with triamcinalone. Call if not getting better or getting worse.        Follow up plan: Return in about 3 weeks (around 07/11/2016).

## 2016-06-20 NOTE — Patient Instructions (Addendum)
Ciática con rehabilitación  (Sciatica With Rehab)  El nervio ciático va desde la región inferior de la espalda hacia la pierna y es el responsable de la sensibilidad y el control de los músculos de la parte de atrás (posterior) del muslo, pierna y pie. La ciática es una enfermedad caracterizada por una inflamación en este nervio.   SÍNTOMAS  · Signos de daño al nervio, incluso adormecimiento o debilidad en el lado posterior de las extremidades bajas.  · Dolor en la parte posterior del muslo que podría bajar hacia la pierna.  · Dolor que empeora al estar sentado durante largos períodos de tiempo.  · Algunas veces, sensibilidad en las nalgas.  CAUSAS  La causa de la ciática es la inflamación de los nervios ciáticos. La inflamación se debe a que algo irrita el nervio. Entre las causas de la irritación se encuentran:  · Estar sentado durante largos períodos.  · Traumatismos directos al nervio.  · Artritis de la columna.  · Hernia o ruptura de disco.  · Deslizamiento de una vértebra (espondilolistesis).  · Presión de los tejidos blandos, como músculos o el tejidos tipo ligamento (fascia).  LOS RIESGOS AUMENTAN CON:  · Deportes en los que se presiona la columna (fútbol americano o levantamiento de pesas).  · Poca fuerza y flexibilidad.  · No hacer un precalentamiento adecuado.  · Historia familiar de dolor de cintura o trastornos en discos.  · Lesiones o cirugías previas en la espalda.  · Mecánica incorrecta del cuerpo, en especial al levantar, o mala postura.  PREVENCIÓN  · Precalentamiento adecuado y elongación antes de la actividad.  · Mantener la forma física:    Fuerza, flexibilidad y resistencia muscular.  · Capacidad cardiovascular.  · Conozca y use técnicas adecuadas, especialmente en posturas y levantamiento. Cuando sea posible, tener un entrenador que corrija la técnica incorrecta.  · Evite las actividades que tensionen constantemente la columna.  PRONÓSTICO  Si se trata adecuadamente, generalmente es curable  dentro de las 6 semanas. En ocasiones requiere someterse a una cirugía.   COMPLICACIONES RELACIONADAS  · Daño permanente que incluye dolor, adormecimiento, hormigueo o debilidad.  · Dolor crónico en la espalda.  · Riesgos de la cirugía: infecciones, hemorragias, daño en los nervios, o daños a los tejidos circundantes.  TRATAMIENTO  El tratamiento inicial incluye interrumpir las actividades que agravan los síntomas. Se incluye el uso de medicamentos y la aplicación de hielo para reducir el dolor y la inflamación. Los ejercicios de elongación y fortalecimiento pueden ayudar a reducir el dolor con la actividad. Los ejercicios pueden realizarse en el hogar o con un terapeuta. Un terapeuta podrá recomendarle otros tratamientos, como estimulación nerviosa electrónica transcutánea (TENS) o ultrasonido. En algunos casos se indica una inyección de corticoides para reducir la inflamación del nervio ciático. Si los síntomas persisten por más de 6 meses de tratamiento no quirúrgico (conservador), se indicará la cirugía.  MEDICAMENTOS   · Si necesita analgésicos, se recomiendan los antiinflamatorios no esteroides, como aspirina e ibuprofeno y otros calmantes menores, como acetaminofeno.  · No tome medicamentos para el dolor dentro de los 7 días previos a la cirugía.  · Los analgésicos prescriptos se indicarán si el médico lo considera necesario. Utilícelos como se le indique y sólo cuando lo necesite.  · Los ungüentos pueden ser beneficiosos.  · En algunos casos se indica una inyección de corticosteroides. Estas inyecciones deben reservarse para los casos graves, porque sólo se pueden administrar una determinada cantidad de veces.  CALOR Y   FRÍO   · El tratamiento con frío alivia el dolor y reduce la inflamación. El frío debe aplicarse durante 10 a 15 minutos cada 2 ó 3 horas para reducir la inflamación y el dolor e inmediatamente después de cualquier actividad que agrava los síntomas. Utilice bolsas de hielo o masajee la zona  con un trozo de hielo (masaje de hielo).  · El calor puede usarse antes de elongar y de las actividades de fortalecimiento indicadas por el profesional, le fisioterapeuta o el entrenador. Utilice una bolsa térmica o sumerja la lesión en agua caliente.  SOLICITE ATENCIÓN MÉDICA SI:  · El tratamiento parece no ofrecer beneficios, o el trastorno empeora.  · Los medicamentos producen efectos secundarios.  EJERCICIOS  EJERCICIOS DE AMPLITUD DE MOVIMIENTOS Y ELONGACIÓN Ciática  La mayoría de las personas con dolor de ciática encuentran que sus síntomas empeoran con el delantero excesiva flexión (flexión) o arco en la espalda baja (extensión). Los ejercicios que le ayudarán a resolver sus síntomas se centrarán en el movimiento contrario. El médico, fisioterapeuta o entrenador le ayudará a determinar qué ejercicios serán de ayuda para resolver su dolor de espalda. No realice ningún ejercicio sin consultarlo antes con el profesional. Discontinue los ejercicios que empeoran sus síntomas, hasta que hable con el médico. Si siente dolor, entumecimiento u hormigueo que irradia hacia los glúteos, piernas o pies, el objetivo de esta terapia es que estos síntomas se acerquen a la espalda y eventualmente desaparezcan. En ocasiones, los síntomas de la pierna mejorarán, pero el dolor en la espalda puede empeorar; esto es un indicio típico del progreso en la rehabilitación. Asegúrese de que estar atento a cualquier cambio en sus síntomas y las actividades que ha hecho en las 24 horas antes del cambio. Compartir esta información con su médico le permitirá un mejor tratamiento para tratar su enfermedad.  Estos ejercicios le ayudarán en la recuperación de la lesión. Los síntomas podrán aliviarse con o sin asistencia adicional de su médico, fisioterapeuta o entrenador. Al completar estos ejercicios, recuerde:   · Restaurar la flexibilidad del tejido ayuda a que las articulaciones recuperen el movimiento normal. Esto permite que el  movimiento y la actividad sea más saludables y menos dolorosos.  · Para que sea efectiva, cada elongación debe realizarse durante al menos 30 segundos.  · La elongación nunca debe ser dolorosa. Deberá sentir sólo un alargamiento o distensión suave del tejido que estira.  EJERCICIOS DE AMPLITUD DE MOVIMIENTOS Y ELONGACIÓN:  ELONGACION Flexión - una rodilla al pecho  · Recuéstese en una cama dura o sobre el piso, con ambas piernas extendidas al frente.  · Manteniendo una pierna en contacto con el piso, lleve la rodilla opuesta al pecho. Mantenga la pierna en esa posición, sosteniéndola por la zona posterior del muslo o por la rodilla.  · Presione hasta sentir un suave estiramiento en la cintura. Mantenga esta posición durante __________ segundos.  · Libere la pierna lentamente y repita el ejercicio con el lado opuesto.  Repítalo __________ veces. Realice este estiramiento __________ veces por día.   ELONGACIÓN Flexión, dos rodillas al pecho   · Recuéstese en una cama dura o sobre el piso, con ambas piernas extendidas al frente.  · Manteniendo una pierna en contacto con el piso, lleve la rodilla opuesta al pecho.  · Tense los músculos del estómago para apoyar la espalda y levante la otra rodilla hacia el pecho. Mantenga las piernas en su lugar y tómese por detrás de las caderas o las rodillas.  ·   Con ambas rodillas en el pecho, tire hasta que sienta un estiramiento en la parte trasera de la espalda. Mantenga esta posición durante __________ segundos.  · Tense los músculos del estómago y baje las piernas de a una por vez.  Repítalo __________ veces. Realice este estiramiento __________ veces por día.   ELONGACIÓN Rotación de la zona baja del tronco.  · Recuéstese sobre una cama firme o sobre el suelo. Mantenga las piernas al frente, doble las rodillas de modo que ambas apunten hacia el techo y los pies queden bien apoyados en el piso.  · Extienda los brazos a cada lado. Esto estabilizará la zona superior del cuerpo,  manteniendo los hombros en contacto con el piso.  · Con cuidado y lentamente deje caer ambas rodillas juntas hacia un lado, hasta que sienta un suave estiramiento en la espalda baja. Mantenga esta posición durante __________ segundos.  · Tense los músculos del estómago para apoyar la espalda y lleve las rodillas a la posición inicial. Repita el ejercicio hacia el otro lado.  Repítalo __________ veces. Realice este ejercicio __________ veces por día.  EJERCICIOS DE AMPLITUD DE MOVIMIENTOS Y FLEXIBILIDAD:  ELONGACIÓN Extensión posición prona sobre los codos  · Acuéstese sobre el estómago sobre el piso, una cama será muy blanda. Coloque las palmas a una distancia igual al ancho de los hombres y a la altura de la cabeza.  · Coloque los codos bajo los hombros. Si siente dolor, colóquese almohadas debajo del pecho.  · Deje que su cuerpo se relaje, de modo que las caderas queden más abajo y tengan más contacto con el piso.  · Mantenga esta posición durante __________ segundos.  · Vuelva lentamente a la posición plana sobre el piso.  Repítalo __________ veces. Realice este estiramiento __________ veces por día.   AMPLITUD DE MOVIMIENTOS - Extensión - flexión de brazos en posición prona  · Acuéstese sobre el estómago sobre el piso, una cama será muy blanda. Coloque las palmas a una distancia igual al ancho de los hombres y a la altura de la cabeza.  · Mantenga la espalda tan relajada como pueda, enderece lentamente los codos mientras mantiene las caderas contra el suelo. Puede modificar la posición de las manos para estar más cómodo. A medida que gana movimiento, sus manos quedarán más por debajo de los hombros.  · Mantenga cada posición durante __________ segundos.  · Vuelva lentamente a la posición plana sobre el piso.  Repítalo __________ veces. Realice este estiramiento __________ veces por día.   EJERCICIOS DE FORTALECIMIENTO - Ciática  Estos ejercicios le ayudarán en la recuperación de la lesión. Estos ejercicios deben  hacerse cerca de su "punto dulce". Este es el arco neutro, de la parte baja de la espalda, en algún lugar entre la posición completamente redondeada y arqueada plenamente, que es la posición menos dolorosa. Cuando se realiza en este nivel de seguridad del movimiento, estos ejercicios se pueden utilizar para las personas que tienen una lesión basada en flexión o extensión. Con estos ejercicios, los síntomas podrán desaparecer con o sin mayor intervención del profesional, el fisioterapeuta o el entrenador. Al completar estos ejercicios, recuerde:   · Los músculos pueden ganar tanto la resistencia como la fuerza que necesita para sus actividades diarias a través de ejercicios controlados.  · Realice los ejercicios como se lo indicó el médico, el fisioterapeuta o el entrenador. Avance sólo con los ejercicios de resistencia y haga las repeticiones que su médico le indique.  · Podrá experimentar dolor o cansancio   muscular, pero el dolor o molestia que trata de eliminar a través de los ejercicios nunca debe empeorar. Si el dolor empeora, deténgase y asegúrese de que está siguiendo las directivas correctamente. Si aún siente dolor luego de realizar lo ajustes necesarios, deberá discontinuar el ejercicio hasta que pueda conversar con el profesional sobre el problema.  FORTALECIMIENTO - Abdominales profundos - Inclinación pélvica  · Recuéstese sobre una cama firme o sobre el suelo. Mantenga las piernas al frente, doble las rodillas de modo que ambas apunten hacia el techo y los pies queden bien apoyados en el piso.  · Tensione la zona baja de los músculos abdominales para presionar la cintura contra el piso. Este movimiento hará rotar su pelvis de modo que el cóccix quede hacia arriba y no apuntando a los pies o hacia el piso.  · Con una tensión suave y respiración pareja, mantenga esta posición durante __________ segundos.  Repítalo __________ veces. Realice este estiramiento __________ veces por día.   FORTALECIMIENTO -  Abdominales encogimiento abdominal  · Recuéstese sobre una cama firme o sobre el suelo. Mantenga las piernas al frente, doble las rodillas de modo que ambas apunten hacia el techo y los pies queden bien apoyados en el piso. Cruce las manos sobre el pecho.  · Apunte suavemente con la barbilla hacia abajo, sin doblar el cuello.  · Tensione los abdominales y eleve lentamente el tronco la altura suficiente para despegar los omóplatos. Si se eleva más, pondrá tensión excesiva en la cintura y esto no fortalecerá más los abdominales.  · Controle la vuelta a la posición inicial.  Repítalo __________ veces. Realice este estiramiento __________ veces por día.   FORTALECIMIENTO - En cuadrúpedo, elevación de miembro superior e inferior opuestos  · Coloque las manos y las rodillas en una superficie firme. Las manos deben quedar a la altura de los hombros y las rodillas debajo de las caderas. Puede colocar algo debajo las rodillas para estar más cómodo.  · Encuentre la posición neutral de la columna vertebral y tensione ligeramente los músculos abdominales de modo que pueda mantener esta posición. Los hombros y las caderas deben formar un rectángulo paralelo con el suelo y recto.  · Manteniendo el tronco firme, eleve la mano derecha a la altura del hombro y luego eleve la pierna izquierda a la altura de la cadera. Asegúrese de no contener la respiración. Mantenga cada posición durante __________ segundos.  · Con los músculos abdominales en tensión y la espalda firme, vuelva lentamente a la posición inicial. Repita con el otro brazo y la otra pierna.  Repítalo __________ veces. Realice este estiramiento __________ veces por día.   FUERZA Abdominales y cuádriceps - Levantar las piernas rectas  · Recuéstese en una cama dura o sobre el piso, con ambas piernas extendidas al frente.  · Deje una pierna en contacto con el suelo y doble la otra rodilla de manera que el pie quede contra el suelo.  · Encuentre la posición neutral de la  columna vertebral y tensione ligeramente los músculos abdominales de modo que pueda mantener esta posición.  · Levante lentamente la pierna del suelo una 6 pulgadas y cuente hasta 15, asegúrese de no contener la respiración.  · Mantega la columna en posición neutral, y baje lentamente la pierna hasta el suelo.  Repita el ejercicio con cada pierna __________ veces. Realice este estiramiento __________ veces por día.  CONSIDERACIONES ACERCA DE LA POSTURA Y LA MECÁNICA DEL CUERPO Ciática  Si mantiene una postura correcta cuando se   encuentre de pie, sentado o realizando sus actividades, reducirá el estrés en los diferentes tejidos del cuerpo, y permitirá a los tejidos lesionados la posibilidad de curarse y limitar las experiencias dolorosas. A continuación se indican pautas generales para mejorar la postura- Su médico o fisioterapeuta le dará instrucciones específicas según sus necesidades. Al leer estas pautas recuerde:  · Los ejercicios indicados por su médico lo ayudarán a lograr la flexibilidad y la fuerza para mantener las posturas correctas.  · Una postura correcta le proporciona a sus articulaciones el medio óptimo para funcionar bien. Las articulaciones se desgastan menos cuando están sostenidas adecuadamente por una columna vertebral en buena postura. Esto significa que su cuerpo estará más sano y dolerá menos.  · La correcta postura debe practicarse en todas las actividades, especialmente al estar sentado o de pie durante mucho tiempo. También es importante al realizar actividades repetitivas de bajo estrés (tipeo) o una actividad única y pesada.  POSICIONES DE DESCANSO  Tenga en cuenta cuáles son las posturas que más dolor le causan al elegir una posición de descanso. Si siente dolor con las actividades en que deba realizar una flexión (sentarse, inclinarse, detenerse, ponerse en cuclillas), elija una posición que le permita descansar en una postura menos flexionada. Evite curvarse en posición fetal cuando se  encuentre de lado. Si el dolor empeora con las actividades basadas en la extensión (estar de pie durante un tiempo prolongado, trabajar con las manos por arriba de la cabeza) evite descansar en una posición extendida durante mucho tiempo, como dormir sobre el estómago. La mayoría de las personas encontrará cómodo el descanso sobre la columna vertebral en una posición neutral, ni muy redondeada ni muy arqueada. Recuéstese sobre su lado en una cama que no esté hundida con una almohada entre las rodillas o sobre la espalda con una almohada bajo las rodillas, y sentirá alivio. Tenga en cuenta que cualquier posición en tiempo prolongado, no importa si es una postura correcta, puede provocarle rigidez.  POSTURAS CORRECTAS PARA SENTARSE  Con el fin de minimizar el estrés y el malestar en su columna, deberá sentarse con la postura correcta. Esto le ayudará a que el cuerpo esté más sano. Recuperar una buena postura es un proceso gradual. Muchas personas pueden trabajar más cómodas mediante el uso de diferentes soportes hasta que tengan la flexibilidad y la fuerza para mantener esta postura por su cuenta.  Al sentarse con la postura correcta, los oídos deben estar sobre los hombros y los hombros sobre las caderas.  Debe utilizar el respaldo de la silla para apoyar la espalda. La espalda estará en una posición neutral, ligeramente arqueada. Puede colocar una pequeña almohada o toalla doblada en la base de la espalda baja para apoyarse.   Si trabaja en un escritorio, cree un ambiente que le proporciones un buen soporte y una postura erguida. Sin soporte extra, los músculos se fatigan y causan tensión excesiva en las articulaciones y otros tejidos. Tenga en cuenta estas recomendaciones:  SILLA:   · La silla debe poder deslizarse por debajo del escritorio cuando su espalda tome contacto con el respaldo. Esto le permitirá trabajar más cerca.  · La altura de la silla debe permitirle que los ojos tengan el nivel de la parte superior  del monitor y las manos estén más abajo que los codos.  POSICIÓN DEL CUERPO  · Los pies deben tener contacto con el piso. Si no es posible, use un posapies.  · Mantenga las orejas sobre los hombros. Esto reducirá el estrés en   el cuello y en la cintura.  POSTURAS INCORRECTAS PARA SENTARSE  · Si se siente cansado e incapaz de asumir una postura sentada sana, no se encorve ni se hunda. Esto pone una tensión excesiva en los tejidos de su espalda, y causa más daño y dolor. Entre las opciones más saludables se incluyen:  · El uso de más apoyo, como una almohada lumbar.  · Cambio de tareas, a algo que demande una posición vertical o caminar.  · Tomar una breve caminata.  · Recostarse y descansar en una posición neutral.  DE PIE DURANTE UN TIEMPO PROLONGADO E INCLINADO LIGERAMENTE HACIA ADELANTE  Cuando deba realizar una tarea que requiera inclinación hacia adelante estando de pie en el mismo sitio durante mucho tiempo, coloque un pie en un objeto de 2 a 4 pulgadas de alto, para mantener una mejor postura. Cuando ambos pies están en el piso, la zona inferior de la espalda tiene a perder su ligera curvatura hacia adentro. Si esta curva se aplana (o se pronuncia demasiado) la espalda y las articulaciones experimentarán demasiado estrés, se fatigarán más rápidamente y causarán dolor.   POSTURAS CORRECTAS PARA ESTAR DE PIE  Una postura adecuada de pie realizarse en todas las actividades diarias, incluso si sólo toman un momento, como al cepillarse los dientes. Como en la postura de sentado, los oídos deben estar sobre los hombros y los hombros sobre las caderas.  Deberá mantener una ligera tensión en sus músculos abdominales para asegurar la columna vertebral. El cóccix debe apuntar hacia el suelo, no detrás de su cuerpo, por que resulta en una curvatura de la espalda sobre-extendida.   POSTURAS INCORRECTAS PARA ESTAR DE PIE  Las posturas incorrectas para estar de pie incluyen tener la cabeza hacia delante, las rodillas  bloqueadas o una excesiva curvatura de la espalda.  CAMINAR  Camine en una postura erguida. Las orejas, hombros y caderas deben estar alineados.  ACTIVIDAD PROLONGADA EN UNA POSICIÓN FLEXIONADA  Al completar una tarea que requiere que se doble la cintura hacia adelante o inclinarse sobre una superficie baja, trate de encontrar una manera de estabilizar 3 de cada 4 de sus miembros. Puede colocar una mano o el codo en el muslo, o descansar una rodilla en la superficie en la que está apoyado. Esto le proporcionará más estabilidad para que sus músculos no se cansen tan rápidamente.  El mantener las rodillas relajadas, o ligeramente dobladas, también reducirá el estrés en la espalda baja.  TÉCNICAS CORRECTAS PARA LEVANTAR OBJETOS  SI:   · Asumir una postura amplia. Esto le proporcionará más estabilidad y la oportunidad de acercarse lo más posible al objeto que se está levantando.  · Tense los abdominales para asegurar la columna vertebral; luego flexione rodillas y caderas. Manteniendo la espalda en una posición neutral, haga el esfuerzo con los músculos de la pierna.  Levántese con las piernas, manteniendo la espalda derecha.  · Pruebe el peso de los objetos desconocidos antes de tratar de levantarlos.  · Trate de mantener los codos hacia abajo y a los lados, con el fin de obtener la fuerza de los hombros al llevar un objeto.  · Siempre pida ayuda a otra persona cuando deba levantar objetos pesados o incómodos  TÉCNICAS INCORRECTAS PARA LEVANTAR OBJETOS  NO:   · Bloquee rodillas al levantar, aunque sea un objeto pequeño.  · Se doble ni gire. Gire sobre los pies ni los mueva cuando necesite cambiar de dirección.  · Tome conciencia que no puede levantar con seguridad ni un clip de papel,   sin una postura correcta.     Esta información no tiene como fin reemplazar el consejo del médico. Asegúrese de hacerle al médico cualquier pregunta que tenga.     Document Released: 10/05/2009 Document Revised: 03/03/2015  [employer]  Interactive Patient Education ©2016 [employer]

## 2016-06-21 ENCOUNTER — Telehealth: Payer: Self-pay | Admitting: *Deleted

## 2016-06-21 LAB — CBC
Hematocrit: 40 % (ref 34.0–46.6)
Hemoglobin: 13.4 g/dL (ref 11.1–15.9)
MCH: 26.3 pg — ABNORMAL LOW (ref 26.6–33.0)
MCHC: 33.5 g/dL (ref 31.5–35.7)
MCV: 78 fL — AB (ref 79–97)
PLATELETS: 325 10*3/uL (ref 150–379)
RBC: 5.1 x10E6/uL (ref 3.77–5.28)
RDW: 16.9 % — AB (ref 12.3–15.4)
WBC: 7.2 10*3/uL (ref 3.4–10.8)

## 2016-06-21 LAB — HEMOGLOBIN A1C
Est. average glucose Bld gHb Est-mCnc: 114 mg/dL
HEMOGLOBIN A1C: 5.6 % (ref 4.8–5.6)

## 2016-06-21 LAB — GLUCOSE TOLERANCE, 2 HOURS
GLUCOSE FASTING GTT: 89 mg/dL (ref 65–99)
Glucose, 2 hour: 132 mg/dL (ref 65–139)

## 2016-06-21 NOTE — Telephone Encounter (Signed)
-----   Message from [place] NailsMelody N Shamble[place], [place] sent at 06/21/2016  8:46 AM EDT ----- Please let her know all labs were normal

## 2016-06-21 NOTE — Telephone Encounter (Signed)
Notified pt of results 

## 2016-07-12 ENCOUNTER — Encounter: Payer: Self-pay | Admitting: Family Medicine

## 2016-07-12 ENCOUNTER — Ambulatory Visit (INDEPENDENT_AMBULATORY_CARE_PROVIDER_SITE_OTHER): Payer: Managed Care, Other (non HMO) | Admitting: Family Medicine

## 2016-07-12 VITALS — BP 118/77 | HR 76 | Temp 98.5°F | Ht 64.5 in | Wt 214.0 lb

## 2016-07-12 DIAGNOSIS — L989 Disorder of the skin and subcutaneous tissue, unspecified: Secondary | ICD-10-CM

## 2016-07-12 DIAGNOSIS — M5431 Sciatica, right side: Secondary | ICD-10-CM | POA: Diagnosis not present

## 2016-07-12 NOTE — Progress Notes (Signed)
BP 118/77 (BP Location: Left Arm, Patient Position: Sitting, Cuff Size: Large)   Pulse 76   Temp 98.5 F (36.9 C)   Ht 5' 4.5" (1.638 m)   Wt 214 lb (97.1 kg)   SpO2 99%   BMI 36.17 kg/m    Subjective:    Patient ID: Andrea Ellis, female    DOB: [birth year], 30 y.o.   MRN: [id number]  HPI: Andrea Ellis is a 30 y.o. female  Chief Complaint  Patient presents with  . Leg Pain    pt states she is here to f/up on right leg pain, states cream that she was given last time did not help   BACK PAIN- no better. Pain is tight and in her back into her hip when she lays flat or stands up, medication did not help. Has done PT 2x without benefit. Has not heard from sports medicine yet Duration: 3 years Mechanism of injury: unknown Location: Right and low back Onset: gradual Severity: moderate Quality: aching and sharp Frequency: constant Radiation: R leg above the knee Aggravating factors: lifting and standing at work Alleviating factors: medicine for a short period Status: stable Treatments attempted: rest, ice, heat, APAP, ibuprofen, aleve and physical therapy  Relief with NSAIDs?: mild Nighttime pain:  no Paresthesias / decreased sensation:  yes Bowel / bladder incontinence:  no Fevers:  no Dysuria / urinary frequency:  no  Spot on her leg got no better. Used the triamcinalone without benefit.   Relevant past medical, surgical, family and social history reviewed and updated as indicated. Interim medical history since our last visit reviewed. Allergies and medications reviewed and updated.  Review of Systems  Constitutional: Negative.   Respiratory: Negative.   Cardiovascular: Negative.   Musculoskeletal: Positive for arthralgias, back pain and myalgias. Negative for gait problem, joint swelling and neck pain.  Skin: Positive for color change and rash. Negative for pallor and wound.  Psychiatric/Behavioral: Negative.     Per HPI unless specifically indicated  above     Objective:    BP 118/77 (BP Location: Left Arm, Patient Position: Sitting, Cuff Size: Large)   Pulse 76   Temp 98.5 F (36.9 C)   Ht 5' 4.5" (1.638 m)   Wt 214 lb (97.1 kg)   SpO2 99%   BMI 36.17 kg/m   Wt Readings from Last 3 Encounters:  07/12/16 214 lb (97.1 kg)  06/20/16 209 lb (94.8 kg)  05/05/16 211 lb (95.7 kg)    Physical Exam  Constitutional: She is oriented to person, place, and time. She appears well-developed and well-nourished. No distress.  HENT:  Head: Normocephalic and atraumatic.  Right Ear: Hearing normal.  Left Ear: Hearing normal.  Nose: Nose normal.  Eyes: Conjunctivae and lids are normal. Right eye exhibits no discharge. Left eye exhibits no discharge. No scleral icterus.  Cardiovascular: Normal rate, regular rhythm, normal heart sounds and intact distal pulses.  Exam reveals no gallop and no friction rub.   No murmur heard. Pulmonary/Chest: Effort normal and breath sounds normal. No respiratory distress. She has no wheezes. She has no rales. She exhibits no tenderness.  Neurological: She is alert and oriented to person, place, and time.  Skin: Skin is warm, dry and intact. No rash noted. No erythema. No pallor.  Raised lines of erythematous spots on her R anterior leg  Psychiatric: She has a normal mood and affect. Her speech is normal and behavior is normal. Judgment and thought content normal. Cognition and memory are normal.  Nursing note and vitals reviewed. Back Exam:    Inspection:  Normal spinal curvature.  No deformity, ecchymosis, erythema, or lesions     Palpation:                       Midline spinal tenderness: no                        Paralumbar tenderness: no                         Parathoracic tenderness: no                         Buttocks tenderness: yes Right                       Range of Motion:                        Flexion: Fingers to Mid-Tibia                       Extension:Decreased                       Lateral  bending:Decreased                       Rotation:Decreased    Neuro Exam:Lower extremity DTRs normal & symmetric.  Strength and sensation intact.    Special Tests:                        Straight leg raise:negative   Results for orders placed or performed in visit on 05/05/16  CBC  Result Value Ref Range   WBC 7.2 3.4 - 10.8 x10E3/uL   RBC 5.10 3.77 - 5.28 x10E6/uL   Hemoglobin 13.4 11.1 - 15.9 g/dL   Hematocrit 16.1 09.6 - 46.6 %   MCV 78 (L) 79 - 97 fL   MCH 26.3 (L) 26.6 - 33.0 pg   MCHC 33.5 31.5 - 35.7 g/dL   RDW 04.5 (H) 40.9 - 81.1 %   Platelets 325 150 - 379 x10E3/uL  Hemoglobin A1c  Result Value Ref Range   Hgb A1c MFr Bld 5.6 4.8 - 5.6 %   Est. average glucose Bld gHb Est-mCnc 114 mg/dL  Glucose tolerance, 2 hours  Result Value Ref Range   Glucose, GTT - Fasting 89 65 - 99 mg/dL   Glucose, 2 hour 914 65 - 139 mg/dL      Assessment & Plan:   Problem List Items Addressed This Visit    None    Visit Diagnoses    Sciatica of right side    -  Primary   Appointment with Sports med set up. Continue medication for now. Continue to monitor.    Skin lesion       Shave biopsy taken today. Await results.    Relevant Orders   Pathology Report      Skin Procedure  Procedure: Informed consent given.  Sterile prep of the area.  Area infiltrated with lidocaine without epinephrine.  Using a surgical blade, part of the upper dermis shaved off and sent  for pathology.  Area cauterized. Pt ed on scarring.     Diagnosis:   ICD-9-CM ICD-10-CM   1. Sciatica of right  side 724.3 M54.31    Appointment with Sports med set up. Continue medication for now. Continue to monitor.   2. Skin lesion 709.9 L98.9 Pathology Report   Shave biopsy taken today. Await results.     Lesion Location/Size: 0.5cm x6cm lines on knee Physician: MJ Consent:  Risks, benefits, and alternative treatments discussed and all questions were answered.  Patient elected to proceed and verbal consent obtained.   Description: Area prepped and draped using semi-sterile technique. Area locally anesthetized using 2.5 cc's of lidocaine 1% plain. Shave biopsy of lesion performed using a #15 blade scalpel.  Adequate hemostastis achieved using Silver Nitrate. Area dressed with triple antibiotic ointment and band aid.  Post Procedure Instructions:  Wound care instructions discussed and patient was instructed to keep area clean and dry.  Signs and symptoms of infection discussed, patient agrees to contact the office ASAP should they occur.  Dressing change recommended every other day.   Follow up plan: Return 4-6 weeks, for follow up back.

## 2016-07-12 NOTE — Patient Instructions (Addendum)
[facility]ita con Dr. Antoine PrimasZachary Ellis  [address], [place], [place] [id number] 07/28/16 1PM

## 2016-07-19 LAB — PATHOLOGY

## 2016-07-27 NOTE — Progress Notes (Signed)
[facility] [address] [place], [place] [address] Phone: [phone] Subjective:    I'm seeing this patient by the request  of:  Olevia Perches, DO   CC: Low back pain.   BMW:[id number]  Andrea Ellis is a 30 y.o. female coming in with complaint of low back pain. Patient states that it seems to be hurting her more when she is standing for long amount of time. Patient does work in a factory. Patient did have a pregnancy back in May. Patient's on primary care provider is was diagnosed with sciatica with pain going down the right hip and right leg. Patient went to physical therapy a couple times and no significant improvement. Was given ibuprofen and Flexeril and did not seem to make any significant improvement as well. Was started on a different muscle relaxer and anti-inflammatory. Continue same pain. Seems to be daily. If anything seems to be worsening.   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  Past Medical History:  Diagnosis Date  . Headache    Past Surgical History:  Procedure Laterality Date  . KNEE SURGERY     Social History   Social History  . Marital status: Single    Spouse name: N/A  . Number of children: N/A  . Years of education: N/A   Occupational History  . manufactoring    Social History Main Topics  . Smoking status: Never Smoker  . Smokeless tobacco: Never Used  . Alcohol use No  . Drug use: No  . Sexual activity: Not Currently    Partners: Male    Birth control/ protection: None   Other Topics Concern  . None   Social History Narrative  . None   No Known Allergies Family History  Problem Relation Age of Onset  . Alcohol abuse Father     Past medical history, social, surgical and family history all reviewed in electronic medical record.  No pertanent information unless  stated regarding to the chief complaint.   Review of Systems: No headache, visual changes, nausea, vomiting, diarrhea, constipation, dizziness, abdominal pain, skin rash, fevers, chills, night sweats, weight loss, swollen lymph nodes, body aches, joint swelling, muscle aches, chest pain, shortness of breath, mood changes.   Objective  Blood pressure 114/80, pulse 84, weight 209 lb (94.8 kg), SpO2 99 %, not currently breastfeeding.  General: No apparent distress alert and oriented x3 mood and affect normal, dressed appropriately.  HEENT: Pupils equal, extraocular movements intact  Respiratory: Patient's speak in full sentences and does not appear short of breath  Cardiovascular: No lower extremity edema, non tender, no erythema  Skin: Warm dry intact with no signs of infection or rash on extremities or on axial skeleton.  Abdomen: Soft nontender  Neuro: Cranial nerves II through XII are intact, neurovascularly intact in all extremities with 2+ DTRs and 2+ pulses.  Lymph: No lymphadenopathy of posterior or anterior cervical chain or axillae bilaterally.  Gait normal with good balance and coordination.  MSK:  Non tender with full range of motion and good stability and symmetric strength and tone of shoulders, elbows, wrist, hip, knee and ankles bilaterally.  Back Exam:  Inspection: Unremarkable  Motion: Flexion 45 deg, Extension 25 deg, Side Bending to 45 deg bilaterally,  Rotation to 45 deg bilaterally  SLR laying: Negative  XSLR laying: Negative  Palpable tenderness: Tenderness  to palpation in the L4 region mostly on the right side FABER: Mild positive right. Sensory change: Gross sensation intact to all lumbar and sacral dermatomes.  Reflexes: 2+ at both patellar tendons, 2+ at achilles tendons, Babinski's downgoing.  Strength at foot  Strength and seems to be symmetric. Mild weakness of hip abductors bilaterally Gait unremarkable.  Osteopathic findings C2 flexed rotated and side bent  right T6 extended rotated and side bent left L2 flexed rotated and side bent right Sacrum left on left   Impression and Recommendations:     This case required medical decision making of moderate complexity.      Note: This dictation was prepared with Dragon dictation along with smaller phrase technology. Any transcriptional errors that result from this process are unintentional.

## 2016-07-28 ENCOUNTER — Ambulatory Visit (INDEPENDENT_AMBULATORY_CARE_PROVIDER_SITE_OTHER): Payer: Managed Care, Other (non HMO) | Admitting: Family Medicine

## 2016-07-28 ENCOUNTER — Encounter: Payer: Self-pay | Admitting: Family Medicine

## 2016-07-28 DIAGNOSIS — M9904 Segmental and somatic dysfunction of sacral region: Secondary | ICD-10-CM

## 2016-07-28 DIAGNOSIS — M9902 Segmental and somatic dysfunction of thoracic region: Secondary | ICD-10-CM | POA: Diagnosis not present

## 2016-07-28 DIAGNOSIS — M5416 Radiculopathy, lumbar region: Secondary | ICD-10-CM | POA: Insufficient documentation

## 2016-07-28 DIAGNOSIS — M9903 Segmental and somatic dysfunction of lumbar region: Secondary | ICD-10-CM | POA: Diagnosis not present

## 2016-07-28 DIAGNOSIS — M999 Biomechanical lesion, unspecified: Secondary | ICD-10-CM | POA: Insufficient documentation

## 2016-07-28 MED ORDER — GABAPENTIN 100 MG PO CAPS: 200.0000 mg | ORAL_CAPSULE | Freq: Every day | ORAL | 3 refills | Status: DC

## 2016-07-28 NOTE — Patient Instructions (Signed)
Good to see you.  Ice 20 minutes 2 times daily. Usually after activity and before bed. Exercises 3 times a week.  Gabapentin 200 mg at night  See me again in 3-4 weeks  

## 2016-07-28 NOTE — Assessment & Plan Note (Signed)
Patient is having some mild lumbar radiculopathy but I do think that she will respond well to conservative measures. I believe that patient's higher progesterone since her pregnancy likely contributed to some of the imbalances. Patient given home exercises, will be started on low dose gabapentin at night. We discussed icing regimen. Patient responded very well to osteopathic manipulation. Patient knows if worsening symptoms to give us a call. X-rays have been taken and were fairly unremarkable. If worsening pain at follow-up we'll consider formal physical therapy.

## 2016-07-28 NOTE — Assessment & Plan Note (Signed)
Decision today to treat with OMT was based on Physical Exam  After verbal consent patient was treated with HVLA, ME, FPR techniques in cervical, thoracic, lumbar and sacral areas  Patient tolerated the procedure well with improvement in symptoms  Patient given exercises, stretches and lifestyle modifications  See medications in patient instructions if given  Patient will follow up in 3-4 weeks  

## 2016-08-11 ENCOUNTER — Ambulatory Visit: Payer: Managed Care, Other (non HMO) | Admitting: Family Medicine

## 2016-08-22 ENCOUNTER — Encounter
Admission: EM | Disposition: A | Discharge status: Home / Self Care | Payer: Self-pay | Source: Home / Self Care | Attending: Surgery

## 2016-08-22 ENCOUNTER — Observation Stay: Payer: Managed Care, Other (non HMO) | Admitting: Anesthesiology

## 2016-08-22 ENCOUNTER — Encounter: Payer: Self-pay | Admitting: Emergency Medicine

## 2016-08-22 ENCOUNTER — Emergency Department: Payer: Managed Care, Other (non HMO)

## 2016-08-22 ENCOUNTER — Observation Stay
Admission: EM | Admit: 2016-08-22 | Discharge: 2016-08-23 | Disposition: A | Discharge status: Home / Self Care | Payer: Managed Care, Other (non HMO) | Attending: Surgery | Admitting: Surgery

## 2016-08-22 DIAGNOSIS — R1031 Right lower quadrant pain: Secondary | ICD-10-CM

## 2016-08-22 DIAGNOSIS — K353 Acute appendicitis with localized peritonitis: Secondary | ICD-10-CM

## 2016-08-22 DIAGNOSIS — K3580 Unspecified acute appendicitis: Secondary | ICD-10-CM | POA: Diagnosis present

## 2016-08-22 HISTORY — PX: LAPAROSCOPIC APPENDECTOMY: SHX408

## 2016-08-22 LAB — CBC
HEMATOCRIT: 42.3 % (ref 35.0–47.0)
HEMOGLOBIN: 14.4 g/dL (ref 12.0–16.0)
MCH: 27.5 pg (ref 26.0–34.0)
MCHC: 34.1 g/dL (ref 32.0–36.0)
MCV: 80.5 fL (ref 80.0–100.0)
Platelets: 356 10*3/uL (ref 150–440)
RBC: 5.25 MIL/uL — AB (ref 3.80–5.20)
RDW: 14.8 % — ABNORMAL HIGH (ref 11.5–14.5)
WBC: 17.7 10*3/uL — AB (ref 3.6–11.0)

## 2016-08-22 LAB — COMPREHENSIVE METABOLIC PANEL
ALK PHOS: 70 U/L (ref 38–126)
ALT: 23 U/L (ref 14–54)
ANION GAP: 7 (ref 5–15)
AST: 22 U/L (ref 15–41)
Albumin: 4.6 g/dL (ref 3.5–5.0)
BILIRUBIN TOTAL: 0.5 mg/dL (ref 0.3–1.2)
BUN: 13 mg/dL (ref 6–20)
CALCIUM: 9.5 mg/dL (ref 8.9–10.3)
CO2: 27 mmol/L (ref 22–32)
Chloride: 102 mmol/L (ref 101–111)
Creatinine, Ser: 0.76 mg/dL (ref 0.44–1.00)
GLUCOSE: 122 mg/dL — AB (ref 65–99)
POTASSIUM: 3.7 mmol/L (ref 3.5–5.1)
Sodium: 136 mmol/L (ref 135–145)
TOTAL PROTEIN: 8.2 g/dL — AB (ref 6.5–8.1)

## 2016-08-22 LAB — URINALYSIS COMPLETE WITH MICROSCOPIC (ARMC ONLY)
Bilirubin Urine: NEGATIVE
Glucose, UA: NEGATIVE mg/dL
KETONES UR: NEGATIVE mg/dL
NITRITE: NEGATIVE
PROTEIN: NEGATIVE mg/dL
SPECIFIC GRAVITY, URINE: 1.02 (ref 1.005–1.030)
pH: 5 (ref 5.0–8.0)

## 2016-08-22 LAB — PREGNANCY, URINE: Preg Test, Ur: NEGATIVE

## 2016-08-22 LAB — LIPASE, BLOOD: Lipase: 24 U/L (ref 11–51)

## 2016-08-22 LAB — SURGICAL PCR SCREEN
MRSA, PCR: NEGATIVE
Staphylococcus aureus: NEGATIVE

## 2016-08-22 SURGERY — APPENDECTOMY, LAPAROSCOPIC
Anesthesia: General

## 2016-08-22 MED ORDER — LIDOCAINE HCL (CARDIAC) 20 MG/ML IV SOLN
INTRAVENOUS | Status: DC | PRN
  Administered 2016-08-22: 80 mg via INTRAVENOUS

## 2016-08-22 MED ORDER — FAMOTIDINE IN NACL 20-0.9 MG/50ML-% IV SOLN
20.0000 mg | Freq: Two times a day (BID) | INTRAVENOUS | Status: DC
  Administered 2016-08-22 – 2016-08-23 (×3): 20 mg via INTRAVENOUS
  Filled 2016-08-22 (×5): qty 50

## 2016-08-22 MED ORDER — FENTANYL CITRATE (PF) 100 MCG/2ML IJ SOLN
INTRAMUSCULAR | Status: AC
  Filled 2016-08-22: qty 2

## 2016-08-22 MED ORDER — SUCCINYLCHOLINE CHLORIDE 20 MG/ML IJ SOLN
INTRAMUSCULAR | Status: DC | PRN
  Administered 2016-08-22: 120 mg via INTRAVENOUS

## 2016-08-22 MED ORDER — PROPOFOL 10 MG/ML IV BOLUS
INTRAVENOUS | Status: DC | PRN
  Administered 2016-08-22: 30 mg via INTRAVENOUS
  Administered 2016-08-22: 170 mg via INTRAVENOUS

## 2016-08-22 MED ORDER — ONDANSETRON 4 MG PO TBDP: 4.0000 mg | ORAL_TABLET | Freq: Four times a day (QID) | ORAL | Status: DC | PRN

## 2016-08-22 MED ORDER — SUGAMMADEX SODIUM 200 MG/2ML IV SOLN
INTRAVENOUS | Status: DC | PRN
  Administered 2016-08-22: 195 mg via INTRAVENOUS

## 2016-08-22 MED ORDER — PIPERACILLIN-TAZOBACTAM 3.375 G IVPB 30 MIN
3.3750 g | Freq: Once | INTRAVENOUS | Status: AC
  Administered 2016-08-22: 3.375 g via INTRAVENOUS
  Filled 2016-08-22: qty 50

## 2016-08-22 MED ORDER — DEXTROSE 5 % IV SOLN
2.0000 g | Freq: Once | INTRAVENOUS | Status: DC
  Filled 2016-08-22: qty 2

## 2016-08-22 MED ORDER — ONDANSETRON HCL 4 MG/2ML IJ SOLN
4.0000 mg | Freq: Four times a day (QID) | INTRAMUSCULAR | Status: DC | PRN
Start: 2016-08-22 — End: 2016-08-23
  Administered 2016-08-22: 4 mg via INTRAVENOUS

## 2016-08-22 MED ORDER — DEXAMETHASONE SODIUM PHOSPHATE 10 MG/ML IJ SOLN
INTRAMUSCULAR | Status: DC | PRN
  Administered 2016-08-22: 10 mg via INTRAVENOUS

## 2016-08-22 MED ORDER — MIDAZOLAM HCL 2 MG/2ML IJ SOLN
INTRAMUSCULAR | Status: DC | PRN
  Administered 2016-08-22: 2 mg via INTRAVENOUS

## 2016-08-22 MED ORDER — FENTANYL CITRATE (PF) 100 MCG/2ML IJ SOLN
INTRAMUSCULAR | Status: DC | PRN
  Administered 2016-08-22 (×4): 50 ug via INTRAVENOUS

## 2016-08-22 MED ORDER — MORPHINE SULFATE (PF) 2 MG/ML IV SOLN
4.0000 mg | Freq: Once | INTRAVENOUS | Status: AC
  Administered 2016-08-22: 4 mg via INTRAVENOUS
  Filled 2016-08-22: qty 2

## 2016-08-22 MED ORDER — OXYCODONE HCL 5 MG PO TABS
5.0000 mg | ORAL_TABLET | ORAL | Status: DC | PRN
  Administered 2016-08-22: 5 mg via ORAL
  Filled 2016-08-22: qty 1

## 2016-08-22 MED ORDER — DEXTROSE 5 % IV SOLN
INTRAVENOUS | Status: DC | PRN
  Administered 2016-08-22: 2 g via INTRAVENOUS

## 2016-08-22 MED ORDER — POLYETHYLENE GLYCOL 3350 17 G PO PACK: 17.0000 g | PACK | Freq: Every day | ORAL | Status: DC | PRN

## 2016-08-22 MED ORDER — ACETAMINOPHEN 500 MG PO TABS
1000.0000 mg | ORAL_TABLET | Freq: Four times a day (QID) | ORAL | Status: DC
  Administered 2016-08-22 – 2016-08-23 (×2): 1000 mg via ORAL
  Filled 2016-08-22 (×2): qty 2

## 2016-08-22 MED ORDER — SIMETHICONE 80 MG PO CHEW
80.0000 mg | CHEWABLE_TABLET | Freq: Four times a day (QID) | ORAL | Status: DC | PRN
Start: 2016-08-22 — End: 2016-08-23
  Administered 2016-08-22: 80 mg via ORAL
  Filled 2016-08-22: qty 1

## 2016-08-22 MED ORDER — KETOROLAC TROMETHAMINE 30 MG/ML IJ SOLN
30.0000 mg | Freq: Four times a day (QID) | INTRAMUSCULAR | Status: DC
  Administered 2016-08-22 – 2016-08-23 (×3): 30 mg via INTRAVENOUS
  Filled 2016-08-22 (×3): qty 1

## 2016-08-22 MED ORDER — SODIUM CHLORIDE 0.9 % IV BOLUS (SEPSIS)
1000.0000 mL | Freq: Once | INTRAVENOUS | Status: AC
  Administered 2016-08-22: 1000 mL via INTRAVENOUS

## 2016-08-22 MED ORDER — HYDROMORPHONE HCL 1 MG/ML IJ SOLN
0.5000 mg | INTRAMUSCULAR | Status: DC | PRN
Start: 2016-08-22 — End: 2016-08-23
  Administered 2016-08-22: 0.5 mg via INTRAVENOUS
  Filled 2016-08-22: qty 1

## 2016-08-22 MED ORDER — LACTATED RINGERS IV SOLN: INTRAVENOUS | Status: DC | PRN

## 2016-08-22 MED ORDER — HEPARIN SODIUM (PORCINE) 5000 UNIT/ML IJ SOLN
5000.0000 [IU] | Freq: Three times a day (TID) | INTRAMUSCULAR | Status: DC
Start: 2016-08-22 — End: 2016-08-23
  Administered 2016-08-22 – 2016-08-23 (×2): 5000 [IU] via SUBCUTANEOUS
  Filled 2016-08-22: qty 2

## 2016-08-22 MED ORDER — IOPAMIDOL (ISOVUE-300) INJECTION 61%
30.0000 mL | Freq: Once | INTRAVENOUS | Status: AC | PRN
  Administered 2016-08-22: 30 mL via ORAL

## 2016-08-22 MED ORDER — FENTANYL CITRATE (PF) 100 MCG/2ML IJ SOLN
25.0000 ug | INTRAMUSCULAR | Status: DC | PRN
  Administered 2016-08-22 (×4): 25 ug via INTRAVENOUS

## 2016-08-22 MED ORDER — ROCURONIUM BROMIDE 100 MG/10ML IV SOLN
INTRAVENOUS | Status: DC | PRN
  Administered 2016-08-22: 20 mg via INTRAVENOUS
  Administered 2016-08-22: 25 mg via INTRAVENOUS
  Administered 2016-08-22: 5 mg via INTRAVENOUS

## 2016-08-22 MED ORDER — BUPIVACAINE-EPINEPHRINE 0.5% -1:200000 IJ SOLN
INTRAMUSCULAR | Status: DC | PRN
  Administered 2016-08-22: 20 mL

## 2016-08-22 MED ORDER — IOPAMIDOL (ISOVUE-300) INJECTION 61%
100.0000 mL | Freq: Once | INTRAVENOUS | Status: AC | PRN
  Administered 2016-08-22: 100 mL via INTRAVENOUS

## 2016-08-22 MED ORDER — ONDANSETRON HCL 4 MG/2ML IJ SOLN: 4.0000 mg | Freq: Once | INTRAMUSCULAR | Status: DC | PRN

## 2016-08-22 MED ORDER — LACTATED RINGERS IV SOLN
125.0000 mL/h | INTRAVENOUS | Status: DC
  Administered 2016-08-22 – 2016-08-23 (×2): 125 mL/h via INTRAVENOUS

## 2016-08-22 SURGICAL SUPPLY — 37 items
CANISTER SUCT 1200ML W/VALVE (MISCELLANEOUS) ×3 IMPLANT
CHLORAPREP W/TINT 26ML (MISCELLANEOUS) ×3 IMPLANT
CUTTER FLEX LINEAR 45M (STAPLE) IMPLANT
ELECT CAUTERY BLADE 6.4 (BLADE) ×3 IMPLANT
ELECT REM PT RETURN 9FT ADLT (ELECTROSURGICAL) ×3
ELECTRODE REM PT RTRN 9FT ADLT (ELECTROSURGICAL) ×1 IMPLANT
ENDOPOUCH RETRIEVER 10 (MISCELLANEOUS) ×3 IMPLANT
GLOVE SURG SYN 7.0 (GLOVE) ×3 IMPLANT
GLOVE SURG SYN 7.5  E (GLOVE) ×2
GLOVE SURG SYN 7.5 E (GLOVE) ×1 IMPLANT
GOWN STRL REUS W/ TWL LRG LVL3 (GOWN DISPOSABLE) ×2 IMPLANT
GOWN STRL REUS W/TWL LRG LVL3 (GOWN DISPOSABLE) ×4
IRRIGATION STRYKERFLOW (MISCELLANEOUS) IMPLANT
IRRIGATOR STRYKERFLOW (MISCELLANEOUS)
IV NS 1000ML (IV SOLUTION) ×2
IV NS 1000ML BAXH (IV SOLUTION) ×1 IMPLANT
KIT RM TURNOVER STRD PROC AR (KITS) ×3 IMPLANT
LABEL OR SOLS (LABEL) ×3 IMPLANT
LIGASURE MARYLAND LAP STAND (ELECTROSURGICAL) ×3 IMPLANT
LIQUID BAND (GAUZE/BANDAGES/DRESSINGS) ×3 IMPLANT
NDL HPO THNWL 1X22GA REG BVL (NEEDLE) ×1 IMPLANT
NEEDLE SAFETY 22GX1 (NEEDLE) ×2
NS IRRIG 500ML POUR BTL (IV SOLUTION) ×3 IMPLANT
PACK LAP CHOLECYSTECTOMY (MISCELLANEOUS) ×3 IMPLANT
PENCIL ELECTRO HAND CTR (MISCELLANEOUS) ×3 IMPLANT
RELOAD STAPLE TA45 3.5 REG BLU (ENDOMECHANICALS) IMPLANT
SCISSORS METZENBAUM CVD 33 (INSTRUMENTS) ×3 IMPLANT
SLEEVE ADV FIXATION 5X100MM (TROCAR) ×6 IMPLANT
SUT MNCRL 4-0 (SUTURE) ×2
SUT MNCRL 4-0 27XMFL (SUTURE) ×1
SUT VICRYL 0 AB UR-6 (SUTURE) ×3 IMPLANT
SUTURE MNCRL 4-0 27XMF (SUTURE) ×1 IMPLANT
TRAY FOLEY W/METER SILVER 16FR (SET/KITS/TRAYS/PACK) ×3 IMPLANT
TROCAR 130MM GELPORT  DAV (MISCELLANEOUS) ×3 IMPLANT
TROCAR XCEL BLUNT TIP 100MML (ENDOMECHANICALS) ×3 IMPLANT
TROCAR Z-THREAD OPTICAL 5X100M (TROCAR) ×3 IMPLANT
TUBING INSUFFLATOR HI FLOW (MISCELLANEOUS) ×3 IMPLANT

## 2016-08-22 NOTE — ED Provider Notes (Signed)
-----------------------------------------   8:56 AM on 08/22/2016 -----------------------------------------  CT positive for acute appendicitis. Discussed results with the patient and family. Gen. surgery will be down to speak with the patient shortly.   Minna AntisKevin Xoie Kreuser, MD 08/22/16 [phone]

## 2016-08-22 NOTE — ED Provider Notes (Signed)
[facility] Emergency Department Provider Note   ____________________________________________   First MD Initiated Contact with Patient 08/22/16 [phone]     (approximate)  I have reviewed the triage vital signs and the nursing notes.   HISTORY  Chief Complaint Abdominal Pain and Dizziness    HPI Andrea Ellis is a 30 y.o. female who comes into the hospital today with abdominal pain. She reports it is a strong pain in her right abdomen that started around 7 PM. She reports it is been getting worse and worse. She denies any fever at home but feels as though she is warm currently. She did not take any medications before coming into the hospital. The patient is number had this pain before. She denies any nausea, vomiting or diarrhea. The patient rates her pain 8 out of 10 in intensity. The patient denies headache, chest pain, shortness of breath. She reports that she was able to eat normally but after she ate the pain seemed to be getting worse and worse and worse. The patient couldn't tolerate the pain anymore so she decided to come into the hospital for evaluation.Denies pain with urination or dark looking urine. Her last menstrual period was a week ago.   Past Medical History:  Diagnosis Date  . Headache     Patient Active Problem List   Diagnosis Date Noted  . Lumbar radiculopathy 07/28/2016  . Nonallopathic lesion of lumbosacral region 07/28/2016  . Nonallopathic lesion of sacral region 07/28/2016  . Nonallopathic lesion of thoracic region 07/28/2016    Past Surgical History:  Procedure Laterality Date  . KNEE SURGERY      Prior to Admission medications   Medication Sig Start Date End Date Taking? Authorizing Provider  gabapentin (NEURONTIN) 100 MG capsule Take 2 capsules (200 mg total) by mouth at bedtime. 07/28/16   Judi Saa, DO  levonorgestrel (MIRENA) 20 MCG/24HR IUD 1 Intra Uterine Device (1 each total) by Intrauterine route once.  05/05/16   Melody N Shambley, CNM  methocarbamol (ROBAXIN) 500 MG tablet Take 1 tablet (500 mg total) by mouth 4 (four) times daily. 06/20/16   Megan P Johnson, DO  naproxen (NAPROSYN) 500 MG tablet Take 1 tablet (500 mg total) by mouth 2 (two) times daily with a meal. 06/20/16   Megan P Johnson, DO  triamcinolone ointment (KENALOG) 0.5 % Apply 1 application topically 2 (two) times daily. 06/20/16   Megan P Johnson, DO    Allergies Review of patient's allergies indicates no known allergies.  Family History  Problem Relation Age of Onset  . Alcohol abuse Father     Social History Social History  Substance Use Topics  . Smoking status: Never Smoker  . Smokeless tobacco: Never Used  . Alcohol use No    Review of Systems Constitutional: No fever/chills Eyes: No visual changes. ENT: No sore throat. Cardiovascular: Denies chest pain. Respiratory: Denies shortness of breath. Gastrointestinal:  abdominal pain.  No nausea, no vomiting.  No diarrhea.  No constipation. Genitourinary: Negative for dysuria. Musculoskeletal: Negative for back pain. Skin: Negative for rash. Neurological: Negative for headaches, focal weakness or numbness.  10-point ROS otherwise negative.  ____________________________________________   PHYSICAL EXAM:  VITAL SIGNS: ED Triage Vitals  Enc Vitals Group     BP 08/22/16 0434 (!) 96/56     Pulse Rate 08/22/16 0728 82     Resp --      Temp 08/22/16 0728 98.7 F (37.1 C)     Temp Source 08/22/16  4098 Oral     SpO2 08/22/16 0728 100 %     Weight 08/22/16 0434 209 lb (94.8 kg)     Height 08/22/16 0434 5\' 3"  (1.6 m)     Head Circumference --      Peak Flow --      Pain Score 08/22/16 0434 8     Pain Loc --      Pain Edu? --      Excl. in GC? --     Constitutional: Alert and oriented. Well appearing and in moderate distress. Eyes: Conjunctivae are normal. PERRL. EOMI. Head: Atraumatic. Nose: No congestion/rhinnorhea. Mouth/Throat: Mucous membranes are  moist.  Oropharynx non-erythematous. Cardiovascular: Normal rate, regular rhythm. Grossly normal heart sounds.  Good peripheral circulation. Respiratory: Normal respiratory effort.  No retractions. Lungs CTAB. Gastrointestinal: Soft with RLQ abd pain. No distention. Positive bowel sounds Musculoskeletal: No lower extremity tenderness nor edema.   Neurologic:  Normal speech and language.  Skin:  Skin is warm, dry and intact.  Psychiatric: Mood and affect are normal.   ____________________________________________   LABS (all labs ordered are listed, but only abnormal results are displayed)  Labs Reviewed  COMPREHENSIVE METABOLIC PANEL - Abnormal; Notable for the following:       Result Value   Glucose, Bld 122 (*)    Total Protein 8.2 (*)    All other components within normal limits  CBC - Abnormal; Notable for the following:    WBC 17.7 (*)    RBC 5.25 (*)    RDW 14.8 (*)    All other components within normal limits  URINALYSIS COMPLETEWITH MICROSCOPIC ([facility] ONLY) - Abnormal; Notable for the following:    Color, Urine YELLOW (*)    APPearance HAZY (*)    Hgb urine dipstick 1+ (*)    Leukocytes, UA 3+ (*)    Bacteria, UA RARE (*)    Squamous Epithelial / LPF 6-30 (*)    All other components within normal limits  LIPASE, BLOOD  PREGNANCY, URINE   ____________________________________________  EKG  ED ECG REPORT I, Rebecka Apley, the attending physician, personally viewed and interpreted this ECG.   Date: 08/22/2016  EKG Time: 440  Rate: 72  Rhythm: normal sinus rhythm  Axis: normal  Intervals:none  ST&T Change: normal  ____________________________________________  RADIOLOGY  CT abd and pelvis ____________________________________________   PROCEDURES  Procedure(s) performed: None  Procedures  Critical Care performed: No  ____________________________________________   INITIAL IMPRESSION / ASSESSMENT AND PLAN / ED COURSE  Pertinent labs &  imaging results that were available during my care of the patient were reviewed by me and considered in my medical decision making (see chart for details).  This is a 30 year old female who comes into the hospital today with right lower quadrant pain. The patient reports it started yesterday evening. It's been getting worse and worse. I will give the patient a liter of normal saline as well as morphine. The patient does have an elevated white blood cell count I will do a CT scan looking for a possible appendicitis. The patient will be reassessed when she received the results of her CT scan.  Clinical Course    The patient is awaiting the results of the CT scan. The patient will be signed out to Dr. Lenard Lance who will follow up the results. He will disposition the patient.  ____________________________________________   FINAL CLINICAL IMPRESSION(S) / ED DIAGNOSES  Final diagnoses:  Right lower quadrant abdominal pain  NEW MEDICATIONS STARTED DURING THIS VISIT:  New Prescriptions   No medications on file     Note:  This document was prepared using Dragon voice recognition software and may include unintentional dictation errors.    Rebecka ApleyAllison P Leonel Mccollum, MD 08/22/16 [phone]

## 2016-08-22 NOTE — Progress Notes (Signed)
Dr. Tonita CongWoodham notified of patient's c/o chest pain post ambulation to BR; VSS: 117/66, AP 78; RR 18; 100% 2lpm via [place]; most likely d/t gas; acknowledged; new order written. Juanda CrumbleMelissa F Adalynn Corne, RN 08/22/2016 10:53 PM

## 2016-08-22 NOTE — Anesthesia Procedure Notes (Signed)
Procedure Name: Intubation Date/Time: 08/22/2016 5:19 PM Performed by: Michaele OfferSAVAGE, Analuisa Tudor Pre-anesthesia Checklist: Patient identified, Emergency Drugs available, Suction available, Patient being monitored and Timeout performed Patient Re-evaluated:Patient Re-evaluated prior to inductionOxygen Delivery Method: Circle system utilized Preoxygenation: Pre-oxygenation with 100% oxygen Intubation Type: IV induction, Rapid sequence and Cricoid Pressure applied Laryngoscope Size: Mac and 3 Grade View: Grade I Tube type: Oral Tube size: 7.0 mm Number of attempts: 1 Airway Equipment and Method: Rigid stylet Placement Confirmation: ETT inserted through vocal cords under direct vision,  positive ETCO2 and breath sounds checked- equal and bilateral Secured at: 21 cm Tube secured with: Tape Dental Injury: Teeth and Oropharynx as per pre-operative assessment

## 2016-08-22 NOTE — Transfer of Care (Signed)
Immediate Anesthesia Transfer of Care Note  Patient: Andrea BarrierJeaneth Ellis  Procedure(s) Performed: Procedure(s): APPENDECTOMY LAPAROSCOPIC (N/A)  Patient Location: PACU  Anesthesia Type:General  Level of Consciousness: sedated and patient cooperative  Airway & Oxygen Therapy: Patient Spontanous Breathing and Patient connected to face mask oxygen  Post-op Assessment: Report given to RN and Post -op Vital signs reviewed and stable  Post vital signs: Reviewed and stable  Last Vitals:  Vitals:   08/22/16 1124 08/22/16 1859  BP: 128/70 125/63  Pulse: 90 (!) 105  Resp: 18 (!) 21  Temp: 36.8 C 37.2 C    Last Pain:  Vitals:   08/22/16 1219  TempSrc:   PainSc: 3       Patients Stated Pain Goal: 0 (08/22/16 1153)  Complications: No apparent anesthesia complications

## 2016-08-22 NOTE — ED Notes (Signed)
Pt. Reports normal diet, denies N/V/D

## 2016-08-22 NOTE — ED Notes (Signed)
MD Webster at bedside 

## 2016-08-22 NOTE — Op Note (Signed)
  Procedure Date:  08/22/2016  Pre-operative Diagnosis:  Acute appendicitis  Post-operative Diagnosis: Acute appendicitis  Procedure:  Laparoscopic appendectomy  Surgeon:  Howie IllJose Luis Herlinda Heady, MD  Anesthesia:  General endotracheal  Estimated Blood Loss:  10 ml  Specimens:  appendix  Complications:  none  Indications for Procedure:  This is a 30 y.o. female who presents with abdominal pain and workup revealing acute appendicitis.  The options of surgery versus observation were reviewed with the patient and/or family. The risks of bleeding, infection, recurrence of symptoms, negative laparoscopy, potential for an open procedure, bowel injury, abscess or infection, were all discussed with the patient and she was willing to proceed.  Description of Procedure: The patient was correctly identified in the preoperative area and brought into the operating room.  The patient was placed supine with VTE prophylaxis in place.  Appropriate time-outs were performed.  Anesthesia was induced and the patient was intubated.  Foley catheter was placed.  Appropriate antibiotics were infused.  The abdomen was prepped and draped in a sterile fashion. An infraumbilical incision was made. A cutdown technique was used to enter the abdominal cavity without injury, and a Hasson trocar was inserted.  Pneumoperitoneum was obtained with appropriate opening pressures.  Two 5-mm ports were placed in the suprapubic and left lateral positions under direct visualization.  The right lower quadrant was inspected and the appendix was identified and found to be acutely inflamed but not perforated.  The appendix was carefully dissected. The base of the appendix was dissected out and divided with a standard load Endo GIA. The mesoappendix was divided using the LigaSure.  The appendix was placed in an Endocatch bag and brought out through the umbilical incision.  The right lower quadrant was then inspected again revealing an intact  staple line, no bleeding, and no bowel injury.  The area was thoroughly irrigated.  The 5 mm ports were removed under direct visualization and the Hasson trocar was removed.  The fascial opening was closed using 0 vicryl suture.  Local anesthetic was infused in all incisions and the incisions were closed with 4-0 Monocryl.  The wounds were cleaned and sealed with DermaBond.  Foley catheter was removed and the patient was emerged from anesthesia and extubated and brought to the recovery room for further management.  The patient tolerated the procedure well and all counts were correct at the end of the case.   Howie IllJose Luis Antrone Walla, MD

## 2016-08-22 NOTE — ED Triage Notes (Signed)
Patient reports right side abdominal pain and feeling dizzy that started yesterday afternoon.

## 2016-08-22 NOTE — ED Notes (Signed)
Report was given to Cindy,RN in OR.

## 2016-08-22 NOTE — Anesthesia Preprocedure Evaluation (Signed)
Anesthesia Evaluation  Patient identified by MRN, date of birth, ID band Patient awake    Reviewed: Allergy & Precautions, H&P , NPO status , Patient's Chart, lab work & pertinent test results, reviewed documented beta blocker date and time   Airway Mallampati: III  TM Distance: >3 FB Neck ROM: full    Dental  (+) Teeth Intact   Pulmonary neg pulmonary ROS,    Pulmonary exam normal        Cardiovascular negative cardio ROS Normal cardiovascular exam Rhythm:regular Rate:Normal     Neuro/Psych  Headaches,  Neuromuscular disease negative neurological ROS  negative psych ROS   GI/Hepatic negative GI ROS, Neg liver ROS,   Endo/Other  negative endocrine ROS  Renal/GU negative Renal ROS  negative genitourinary   Musculoskeletal   Abdominal   Peds  Hematology negative hematology ROS (+)   Anesthesia Other Findings Past Medical History: No date: Headache Past Surgical History: No date: KNEE SURGERY BMI    Body Mass Index:  38.09 kg/m     Reproductive/Obstetrics negative OB ROS                             Anesthesia Physical Anesthesia Plan  ASA: II and emergent  Anesthesia Plan: General ETT   Post-op Pain Management:    Induction:   Airway Management Planned:   Additional Equipment:   Intra-op Plan:   Post-operative Plan:   Informed Consent: I have reviewed the patients History and Physical, chart, labs and discussed the procedure including the risks, benefits and alternatives for the proposed anesthesia with the patient or authorized representative who has indicated his/her understanding and acceptance.   Dental Advisory Given  Plan Discussed with: CRNA  Anesthesia Plan Comments:         Anesthesia Quick Evaluation

## 2016-08-22 NOTE — Anesthesia Postprocedure Evaluation (Signed)
Anesthesia Post Note  Patient: Vernell BarrierJeaneth Santone  Procedure(s) Performed: Procedure(s) (LRB): APPENDECTOMY LAPAROSCOPIC (N/A)  Patient location during evaluation: PACU Anesthesia Type: General Level of consciousness: awake and alert Pain management: pain level controlled Vital Signs Assessment: post-procedure vital signs reviewed and stable Respiratory status: spontaneous breathing, nonlabored ventilation, respiratory function stable and patient connected to nasal cannula oxygen Cardiovascular status: blood pressure returned to baseline and stable Postop Assessment: no signs of nausea or vomiting Anesthetic complications: no    Last Vitals:  Vitals:   08/22/16 1859 08/22/16 1914  BP: 125/63 111/67  Pulse: (!) 105 (!) 101  Resp: (!) 21   Temp: 37.2 C     Last Pain:  Vitals:   08/22/16 1914  TempSrc:   PainSc: 5                  Yevette EdwardsJames G Adams

## 2016-08-22 NOTE — H&P (Signed)
Date of Admission:  08/22/2016  Reason for Admission:  Abdominal pain  History of Present Illness: Andrea Ellis is a 30 y.o. female who presents with a one-day history of abdominal pain. Patient reports that the pain started last night around 7 PM. It started in the right lower quadrant with some pain in the periumbilical area as well. Patient denies any nausea, vomiting, chest pain, shortness of breath. Does report feeling febrile but no chills or sweats. Denies any diarrhea or constipation. The pain continued getting worse last night so she presented today to emergency room for further evaluation. Workup in the emergency room shows a white blood cell count of 17 and a CT scan showing acute appendicitis.  Past Medical History: Past Medical History:  Diagnosis Date  . Headache      Past Surgical History: Past Surgical History:  Procedure Laterality Date  . KNEE SURGERY      Home Medications: Prior to Admission medications   Medication Sig Start Date End Date Taking? Authorizing Provider  gabapentin (NEURONTIN) 100 MG capsule Take 2 capsules (200 mg total) by mouth at bedtime. 07/28/16  Yes Judi SaaZachary M Smith, DO  levonorgestrel (MIRENA) 20 MCG/24HR IUD 1 Intra Uterine Device (1 each total) by Intrauterine route once. 05/05/16  Yes Melody N Shambley, CNM  methocarbamol (ROBAXIN) 500 MG tablet Take 1 tablet (500 mg total) by mouth 4 (four) times daily. 06/20/16  Yes Megan P Johnson, DO  naproxen (NAPROSYN) 500 MG tablet Take 1 tablet (500 mg total) by mouth 2 (two) times daily with a meal. 06/20/16  Yes Megan P Johnson, DO    Allergies: No Known Allergies  Social History:  reports that she has never smoked. She has never used smokeless tobacco. She reports that she does not drink alcohol or use drugs.   Family History: Family History  Problem Relation Age of Onset  . Alcohol abuse Father     Review of Systems: Review of Systems  Constitutional: Positive for fever. Negative for  chills.  Eyes: Negative for blurred vision.  Respiratory: Negative for cough and shortness of breath.   Cardiovascular: Negative for chest pain and leg swelling.  Gastrointestinal: Positive for abdominal pain. Negative for constipation, diarrhea, heartburn, nausea and vomiting.  Genitourinary: Negative for dysuria and hematuria.  Musculoskeletal: Negative for myalgias.  Skin: Negative for rash.  Neurological: Negative for dizziness and headaches.  Psychiatric/Behavioral: Negative for depression.  All other systems reviewed and are negative.   Physical Exam BP 117/76 (BP Location: Left Arm)   Pulse 83   Temp 98.4 F (36.9 C) (Oral)   Resp 17   Ht 5\' 3"  (1.6 m)   Wt 94.8 kg (209 lb)   LMP  (Within Weeks)   SpO2 100%   BMI 37.02 kg/m  CONSTITUTIONAL: No acute distress HEENT:  Normocephalic, atraumatic, extraocular motion intact. NECK: Trachea is midline, and there is no jugular venous distension.  LYMPH NODES:  Lymph nodes in the neck are not enlarged. RESPIRATORY:  Lungs are clear, and breath sounds are equal bilaterally. Normal respiratory effort without pathologic use of accessory muscles. CARDIOVASCULAR: Heart is regular without murmurs, gallops, or rubs. GI: The abdomen is soft, nondistended, with tenderness to palpation in the right lower quadrant at McBurney's point. There is no peritoneal signs.  MUSCULOSKELETAL:  Normal muscle strength and tone in all four extremities.  No peripheral edema or cyanosis. SKIN: Skin turgor is normal. There are no pathologic skin lesions.  NEUROLOGIC:  Motor and sensation is grossly  normal.  Cranial nerves are grossly intact. PSYCH:  Alert and oriented to person, place and time. Affect is normal.  Laboratory Analysis: Results for orders placed or performed during the hospital encounter of 08/22/16 (from the past 24 hour(s))  Lipase, blood     Status: None   Collection Time: 08/22/16  5:07 AM  Result Value Ref Range   Lipase 24 11 - 51 U/L   Comprehensive metabolic panel     Status: Abnormal   Collection Time: 08/22/16  5:07 AM  Result Value Ref Range   Sodium 136 135 - 145 mmol/L   Potassium 3.7 3.5 - 5.1 mmol/L   Chloride 102 101 - 111 mmol/L   CO2 27 22 - 32 mmol/L   Glucose, Bld 122 (H) 65 - 99 mg/dL   BUN 13 6 - 20 mg/dL   Creatinine, Ser 1.61 0.44 - 1.00 mg/dL   Calcium 9.5 8.9 - 09.6 mg/dL   Total Protein 8.2 (H) 6.5 - 8.1 g/dL   Albumin 4.6 3.5 - 5.0 g/dL   AST 22 15 - 41 U/L   ALT 23 14 - 54 U/L   Alkaline Phosphatase 70 38 - 126 U/L   Total Bilirubin 0.5 0.3 - 1.2 mg/dL   GFR calc non Af Amer >60 >60 mL/min   GFR calc Af Amer >60 >60 mL/min   Anion gap 7 5 - 15  CBC     Status: Abnormal   Collection Time: 08/22/16  5:07 AM  Result Value Ref Range   WBC 17.7 (H) 3.6 - 11.0 K/uL   RBC 5.25 (H) 3.80 - 5.20 MIL/uL   Hemoglobin 14.4 12.0 - 16.0 g/dL   HCT 04.5 40.9 - 81.1 %   MCV 80.5 80.0 - 100.0 fL   MCH 27.5 26.0 - 34.0 pg   MCHC 34.1 32.0 - 36.0 g/dL   RDW 91.4 (H) 78.2 - 95.6 %   Platelets 356 150 - 440 K/uL  Urinalysis complete, with microscopic     Status: Abnormal   Collection Time: 08/22/16  5:07 AM  Result Value Ref Range   Color, Urine YELLOW (A) YELLOW   APPearance HAZY (A) CLEAR   Glucose, UA NEGATIVE NEGATIVE mg/dL   Bilirubin Urine NEGATIVE NEGATIVE   Ketones, ur NEGATIVE NEGATIVE mg/dL   Specific Gravity, Urine 1.020 1.005 - 1.030   Hgb urine dipstick 1+ (A) NEGATIVE   pH 5.0 5.0 - 8.0   Protein, ur NEGATIVE NEGATIVE mg/dL   Nitrite NEGATIVE NEGATIVE   Leukocytes, UA 3+ (A) NEGATIVE   RBC / HPF 0-5 0 - 5 RBC/hpf   WBC, UA 6-30 0 - 5 WBC/hpf   Bacteria, UA RARE (A) NONE SEEN   Squamous Epithelial / LPF 6-30 (A) NONE SEEN   Mucous PRESENT   Pregnancy, urine     Status: None   Collection Time: 08/22/16  5:07 AM  Result Value Ref Range   Preg Test, Ur NEGATIVE NEGATIVE    Imaging: Ct Abdomen Pelvis W Contrast  Result Date: 08/22/2016 CLINICAL DATA:  Right lower quadrant pain  and dizziness beginning yesterday. Leukocytosis. EXAM: CT ABDOMEN AND PELVIS WITH CONTRAST TECHNIQUE: Multidetector CT imaging of the abdomen and pelvis was performed using the standard protocol following bolus administration of intravenous contrast. CONTRAST:  ISOVUE-300 IOPAMIDOL (ISOVUE-300) INJECTION 61% COMPARISON:  None. FINDINGS: Lower Chest: No acute findings. Hepatobiliary: No mass identified. Moderate diffuse hepatic steatosis demonstrated. Gallbladder is unremarkable. Pancreas:  No mass or inflammatory changes. Spleen: Within normal  limits in size and appearance. Adrenals/Urinary Tract: No masses identified. No evidence of hydronephrosis. Stomach/Bowel: Enlarged appendix is seen which measures 13 mm in diameter. Mild periappendiceal inflammatory changes are seen. These findings are consistent with acute appendicitis. No evidence of abscess or free fluid. Vascular/Lymphatic: Small less than 1 cm mesenteric lymph nodes are noted in the right lower quadrant, likely reactive in etiology. No pathologically enlarged lymph nodes. No abdominal aortic aneurysm. Reproductive: IUD seen in expected position in the uterus. No adnexal masses identified. Other:  None. Musculoskeletal:  No suspicious bone lesions identified. IMPRESSION: Positive for acute appendicitis. No evidence of abscess or other complication. IUD in appropriate position. Moderate hepatic steatosis. Electronically Signed   By: Myles Rosenthal M.D.   On: 08/22/2016 08:36    Assessment and Plan: This is a 30 y.o. female who presents with a one-day history of abdominal pain and workup with findings consistent with acute appendicitis. I personally reviewed all her laboratory and imaging studies and discussed them with the patient. She will be admitted under the surgery team. She will receive a dose of IV antibiotics in the emergency room and will be put on the add-on schedule for laparoscopic appendectomy. Risks and benefits of the procedure have  been to the patient she is willing to proceed. All her questions have been answered and the patient understands this plan   Howie Ill, MD [facility]

## 2016-08-22 NOTE — Progress Notes (Signed)
Report given to OR nurse, pt does not fell comfortable signing the consent at this time, She would like to have the interpreter with the Doctor and then sign the consent. OR nurse was made aware at this time.   [name]

## 2016-08-23 ENCOUNTER — Encounter: Payer: Self-pay | Admitting: Surgery

## 2016-08-23 MED ORDER — OXYCODONE HCL 5 MG PO TABS: 5.0000 mg | ORAL_TABLET | ORAL | 0 refills | Status: DC | PRN

## 2016-08-23 MED ORDER — IBUPROFEN 600 MG PO TABS: 600.0000 mg | ORAL_TABLET | Freq: Three times a day (TID) | ORAL | 0 refills | Status: DC | PRN

## 2016-08-23 MED ORDER — ACETAMINOPHEN 500 MG PO TABS: 1000.0000 mg | ORAL_TABLET | Freq: Four times a day (QID) | ORAL | 0 refills | Status: DC

## 2016-08-23 NOTE — Discharge Summary (Signed)
Patient ID: Geneviene Tesch MRN: [id number] DOB/AGE: [birth year] 29 y.o.  Admit date: 08/22/2016 Discharge date: 08/23/2016   Discharge Diagnoses:  Active Problems:   Acute appendicitis   Procedures: Laparoscopic appendectomy  Hospital Course: Patient was admitted on 10/23 with acute appendicitis. She went to the operating room that same day and had a laparoscopic appendectomy. There were no complications and the patient tolerated the procedure well. Postoperatively the patient was transferred to the floor and will start a clear liquid diet which was advanced to regular diet by postoperative day 1. Her pain was well-controlled with no nausea and was tolerating a diet with no complications. She was ambulating and deemed ready for discharge to home in good condition.  Consults: None  Disposition: 01-Home or Self Care  Discharge Instructions    Call MD for:  difficulty breathing, headache or visual disturbances    Complete by:  As directed    Call MD for:  persistant nausea and vomiting    Complete by:  As directed    Call MD for:  redness, tenderness, or signs of infection (pain, swelling, redness, odor or green/yellow discharge around incision site)    Complete by:  As directed    Call MD for:  severe uncontrolled pain    Complete by:  As directed    Call MD for:  temperature >100.4    Complete by:  As directed    Diet - low sodium heart healthy    Complete by:  As directed    Discharge instructions    Complete by:  As directed    Patient may shower on 10/25 but do not submerge wounds in pool or tub. Do not scrub the wounds heavily and dab dry only. Do not apply any ointments or hydrogen peroxide to the wounds.   Driving Restrictions    Complete by:  As directed    Not drive while taking narcotics for pain control.   Increase activity slowly    Complete by:  As directed    Lifting restrictions    Complete by:  As directed    No heavy lifting of more than 10 pounds for 4  weeks.   No dressing needed    Complete by:  As directed        Medication List    TAKE these medications   acetaminophen 500 MG tablet Commonly known as:  TYLENOL Take 2 tablets (1,000 mg total) by mouth every 6 (six) hours.   gabapentin 100 MG capsule Commonly known as:  NEURONTIN Take 2 capsules (200 mg total) by mouth at bedtime.   ibuprofen 600 MG tablet Commonly known as:  ADVIL,MOTRIN Take 1 tablet (600 mg total) by mouth every 8 (eight) hours as needed for fever or mild pain.   levonorgestrel 20 MCG/24HR IUD Commonly known as:  MIRENA 1 Intra Uterine Device (1 each total) by Intrauterine route once.   methocarbamol 500 MG tablet Commonly known as:  ROBAXIN Take 1 tablet (500 mg total) by mouth 4 (four) times daily.   naproxen 500 MG tablet Commonly known as:  NAPROSYN Take 1 tablet (500 mg total) by mouth 2 (two) times daily with a meal.   oxyCODONE 5 MG immediate release tablet Commonly known as:  Oxy IR/ROXICODONE Take 1-2 tablets (5-10 mg total) by mouth every 4 (four) hours as needed for moderate pain.      Follow-up Information    Henrene Dodge, MD Follow up in 2 week(s).   Specialty:  Surgery Contact  information: [address]  [address] [place] [id number] [phone]

## 2016-08-23 NOTE — Progress Notes (Signed)
Patient discharged to home as ordered. Patient IV discontinued site clean dry and intact. Follow up appointments given as ordered. Patient is alert and oriented no acute distress noted.

## 2016-08-23 NOTE — Progress Notes (Signed)
Ambulating well; adequate I/O; pain meds effective; Gas relieved with simethicone; Windy Carinaurner,Loriana Samad K, RN 6:13 AM 08/23/2016

## 2016-08-24 LAB — SURGICAL PATHOLOGY

## 2016-08-25 NOTE — Progress Notes (Deleted)
Andrea Ellis [address] [place], [place] [address] Phone: [phone] Subjective:    I'm seeing this patient by the request  of:  Olevia Perches, DO   CC: Low back pain f/u   [id number]  Andrea Ellis is a 30 y.o. female coming in with complaint of low back pain. Patient was seen previously and doing respond fairly well to conservative therapy including osteopathic manipulation. Patient was encouraged to continue home exercises and was started on gabapentin. Patient states since last visit patient did have appendectomy and did have appendix removed 08/22/2016. 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  Past Medical History:  Diagnosis Date  . Headache    Past Surgical History:  Procedure Laterality Date  . KNEE SURGERY    . LAPAROSCOPIC APPENDECTOMY N/A 08/22/2016   Procedure: APPENDECTOMY LAPAROSCOPIC;  Surgeon: Henrene Dodge, MD;  Location: [facility] ORS;  Service: General;  Laterality: N/A;   Social History   Social History  . Marital status: Married    Spouse name: N/A  . Number of children: N/A  . Years of education: N/A   Occupational History  . manufactoring    Social History Main Topics  . Smoking status: Never Smoker  . Smokeless tobacco: Never Used  . Alcohol use No  . Drug use: No  . Sexual activity: Yes    Partners: Male    Birth control/ protection: Diaphragm   Other Topics Concern  . Not on file   Social History Narrative  . No narrative on file   No Known Allergies Family History  Problem Relation Age of Onset  . Alcohol abuse Father     Past medical history, social, surgical and family history all reviewed in electronic medical record.  No pertanent information unless stated regarding to the chief complaint.   Review of Systems: No headache, visual changes,  nausea, vomiting, diarrhea, constipation, dizziness, abdominal pain, skin rash, fevers, chills, night sweats, weight loss, swollen lymph nodes, body aches, joint swelling, muscle aches, chest pain, shortness of breath, mood changes.   Objective  not currently breastfeeding.  General: No apparent distress alert and oriented x3 mood and affect normal, dressed appropriately.  HEENT: Pupils equal, extraocular movements intact  Respiratory: Patient's speak in full sentences and does not appear short of breath  Cardiovascular: No lower extremity edema, non tender, no erythema  Skin: Warm dry intact with no signs of infection or rash on extremities or on axial skeleton.  Abdomen: Soft nontender  Neuro: Cranial nerves II through XII are intact, neurovascularly intact in all extremities with 2+ DTRs and 2+ pulses.  Lymph: No lymphadenopathy of posterior or anterior cervical chain or axillae bilaterally.  Gait normal with good balance and coordination.  MSK:  Non tender with full range of motion and good stability and symmetric strength and tone of shoulders, elbows, wrist, hip, knee and ankles bilaterally.  Back Exam:  Inspection: Unremarkable  Motion: Flexion 45 deg, Extension 25 deg, Side Bending to 45 deg bilaterally,  Rotation to 45 deg bilaterally  SLR laying: Negative  XSLR laying: Negative  Palpable tenderness: Tenderness to palpation in the L4 region mostly on the right side FABER: Mild positive right. Sensory change: Gross sensation intact to all lumbar and sacral dermatomes.  Reflexes: 2+ at both patellar tendons, 2+ at achilles tendons, Babinski's downgoing.  Strength  at foot  Strength and seems to be symmetric. Mild weakness of hip abductors bilaterally Gait unremarkable.  Osteopathic findings C2 flexed rotated and side bent right T6 extended rotated and side bent left L2 flexed rotated and side bent right Sacrum left on left   Impression and Recommendations:     This case  required medical decision making of moderate complexity.      Note: This dictation was prepared with Dragon dictation along with smaller phrase technology. Any transcriptional errors that result from this process are unintentional.

## 2016-08-26 ENCOUNTER — Ambulatory Visit: Payer: Managed Care, Other (non HMO) | Admitting: Family Medicine

## 2016-08-30 ENCOUNTER — Ambulatory Visit: Payer: Managed Care, Other (non HMO) | Admitting: Family Medicine

## 2016-08-31 ENCOUNTER — Encounter: Payer: Self-pay | Admitting: Family Medicine

## 2016-09-01 ENCOUNTER — Encounter: Payer: Self-pay | Admitting: Surgery

## 2016-09-01 ENCOUNTER — Ambulatory Visit (INDEPENDENT_AMBULATORY_CARE_PROVIDER_SITE_OTHER): Payer: Managed Care, Other (non HMO) | Admitting: Surgery

## 2016-09-01 VITALS — BP 119/76 | HR 78 | Temp 98.6°F | Wt 210.0 lb

## 2016-09-01 DIAGNOSIS — K353 Acute appendicitis with localized peritonitis, without perforation or gangrene: Secondary | ICD-10-CM

## 2016-09-01 DIAGNOSIS — Z9049 Acquired absence of other specified parts of digestive tract: Secondary | ICD-10-CM

## 2016-09-01 NOTE — Patient Instructions (Signed)
Please call our office with any questions or concerns.  Please do not submerge in a tub, hot tub, or pool until incisions are completely sealed.  Use sun block to incision area over the next year if this area will be exposed to sun. This helps decrease scarring.  You may now resume your normal activities. Listen to your body when lifting, if you have pain when lifting, stop and then try again in a few days.  If you develop redness, drainage, or pain at incision sites- call our office immediately and speak with a nurse.  Por favor tome Miralax 17G al dia para que le ayude con estrenimiento.

## 2016-09-01 NOTE — Progress Notes (Signed)
09/01/2016  HPI: Patient is status post laparoscopic appendectomy on 10/23. She presents today for postop visit. She reports that she no longer has any right lower quadrant pain. She does have some mild soreness over the periumbilical incision and she did have some constipation.  She has tried herbal teas to help.  Denies any fevers, chills, nausea, vomiting.  Vital signs: BP 119/76   Pulse 78   Temp 98.6 F (37 C) (Oral)   Wt 95.3 kg (210 lb)   LMP  (Within Weeks)   BMI 37.20 kg/m    Physical Exam: Constitutional: No acute distress Abdomen: Soft, nondistended, mildly tender to palpation over the periumbilical incision. All 3 incisions are clean dry and intact and healing well.  Assessment/Plan: 30 year old female status post upper scopic appendectomy on 10/23.  -We'll write the patient another work note that she can return to work next Monday 11/6. -She should continue with no heavy lifting restriction until 11/20. -She may follow-up on an as-needed basis. Patient does understand to return to the office or call if any fevers, chills, worsening abdominal pain, redness or tenderness of the incisions, drainage from the wounds, or other concerns.   Howie IllJose Luis Cathye Kreiter, MD [facility]

## 2016-09-05 ENCOUNTER — Telehealth: Payer: Self-pay | Admitting: Surgery

## 2016-09-05 NOTE — Telephone Encounter (Signed)
Patients husband called and stated that she was released to go back to work and is lifting and moving more and now is having pain. The pain is when she is moving. She would like to have a note to be off work another week to heal a little more. Surgery 10/23 Dr. Aleen CampiPiscoya

## 2016-09-05 NOTE — Telephone Encounter (Signed)
Called patient back and she stated that she went back to work this morning and was not able to stay since she started to have pain on her right upper quadrant and low abdominal. 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 I asked her if she had any nausea, vomiting, redness or drainage on her incisions and she stated that she did not. I told her that having this kind of pain after surgery was normal. Patient requested to be off from work this week so she could stay home and relax and try again next Monday on 09/12/2016. I told her that it would be fine and that I would have her letter ready by 1:00 PM. I also told patient that if she experienced any symptoms like nausea, vomiting, redness or drainage from her incisions, or additional abdominal pain, to please give us a call so we could see her. Patient understood and had no further questions.

## 2016-09-08 ENCOUNTER — Ambulatory Visit (INDEPENDENT_AMBULATORY_CARE_PROVIDER_SITE_OTHER): Payer: Managed Care, Other (non HMO) | Admitting: Obstetrics and Gynecology

## 2016-09-08 ENCOUNTER — Other Ambulatory Visit: Payer: Self-pay | Admitting: Obstetrics and Gynecology

## 2016-09-08 ENCOUNTER — Encounter: Payer: Self-pay | Admitting: Obstetrics and Gynecology

## 2016-09-08 VITALS — BP 125/84 | HR 86 | Ht 63.0 in | Wt 211.1 lb

## 2016-09-08 DIAGNOSIS — Z30431 Encounter for routine checking of intrauterine contraceptive device: Secondary | ICD-10-CM | POA: Diagnosis not present

## 2016-09-08 DIAGNOSIS — Z01419 Encounter for gynecological examination (general) (routine) without abnormal findings: Secondary | ICD-10-CM

## 2016-09-08 NOTE — Patient Instructions (Signed)
Cuidados preventivos en las mujeres adultas (Preventive Care for Adults, Female) Un estilo de vida saludable y los cuidados preventivos pueden favorecer la salud y Sena. Las pautas de salud preventivas para las mujeres incluyen las siguientes prcticas clave:  Un examen fsico de rutina anual y Optometrist estudios preventivos es un buen modo de Chief Technology Officer su salud. [place] de Publishing rights manager preocupaciones y Civil engineer, contracting el estado de su salud, y que le realicen estudios completos.  Consulte al dentista para realizar un examen de rutina y cuidados preventivos cada 6 meses. Cepllese los dientes al [employer] veces por da y psese el hilo dental al menos una vez por da. Una buena higiene bucal evita caries y enfermedades de las encas.  La frecuencia con que debe hacerse exmenes de la vista depende de su edad, su estado de [place], su historia familiar, el uso de lentes de contacto y otros factores. Siga las recomendaciones del mdico para saber con qu frecuencia debe hacerse exmenes de la vista.  Consuma una dieta saludable. Los alimentos que contienen vegetales, las frutas, los cereales [place], los productos lcteos bajos en grasas y las protenas magras contienen nutrientes que son necesarios, sin consumir Nurse, mental health. Disminuya la ingesta de alimentos ricos en grasas slidas, azcares y sal agregadas. Consuma la cantidad de caloras adecuada para usted. Si es necesario, pdale informacin acerca de una dieta [place] a su mdico.  Realizar actividad fsica de forma regular es una de las prcticas ms importantes que puede hacer por su salud. Los adultos deben hacer al menos 150 minutos de ejercicios de intensidad moderada (cualquier actividad que aumente la frecuencia cardaca y lo haga transpirar) cada semana. Adems, la [employer] de los adultos necesita practicar ejercicios de fortalecimiento muscular dos o ms veces por semana.  Mantenga un peso saludable. El ndice de masa  corporal [facility]) es una herramienta que identifica posibles problemas con [place]. Proporciona una estimacin de la grasa corporal basndose en el peso y la altura. El mdico podr determinar su [facility] y ayudarlo a Scientist, forensic o Theatre manager un peso saludable. Para los adultos de 20 aos o ms:  Un [facility] menor de 18,5 se considera bajo peso.  Un [facility] entre 18,5 y 24,9 es normal.  Un [facility] entre 25 y 29,9 se considera sobrepeso.  Un IMC de 30 o ms se considera obesidad.  Realice actividad fsica y evite ingerir grasas saturadas para mantener un nivel normal de lpidos y Research scientist (life sciences). Consuma una dieta equilibrada y saludable, e incluya variedad de frutas y Photographer. A partir de los 20 aos se deben realizar anlisis de sangre a fin de Freight forwarder nivel de lpidos y colesterol en la sangre y [place] cada 5 aos. Si los niveles de lpidos o colesterol son altos, tiene ms de 50 aos o tiene riesgo elevado de sufrir enfermedades cardacas, Designer, industrial/product controlarse con ms frecuencia. Si tiene Coca Cola de lpidos y colesterol, debe recibir tratamiento con medicamentos, si la dieta y el ejercicio no estn funcionando.  Si fuma, consulte con el mdico acerca de las opciones para dejar de [place]. Si no consume tabaco, no comience.  Se recomienda realizar exmenes de deteccin de cncer de pulmn a personas adultas entre 78 y 68 aos que estn en riesgo de Horticulturist, commercial de pulmn por sus antecedentes de consumo de tabaco. Para quienes hayan fumado durante 30 aos un paquete diario, y sigan fumando o hayan dejado el hbito en algn momento en los ltimos 15 aos, se recomienda  realizarse una tomografa computarizada de baja dosis de los [employer]. Fumar 1paquete-ao equivale a fumar un promedio de 1paquete de cigarrillos diario durante 1ao (por ejemplo: 1paquete por da durante 30 aos o 2paquetes por da durante 15aos). Los exmenes anuales deben continuar hasta que el fumador  haya dejado de fumar durante un mnimo de 15 aos. Ya no deben Emergency planning/management officer que tengan un problema de salud que les impida recibir tratamiento para el cncer de pulmn.  Si est embarazada, no beba alcohol. Si est amamantando, beba alcohol con prudencia. Si no est embarazada y decide beber alcohol, no beba ms de Naval architect. Se considera una medida a 12onzas (368m) de cerveza, 5onzas (1419m de vino, o 1,[id number] licor.  Evite el consumo de drogas. No comparta las agujas. Pida ayuda si necesita asistencia o instrucciones con respecto a abandonar el consumo de drogas.  La hipertensin arterial causa enfermedades cardacas y auSerbial riesgo de ictus. Debe controlar su presin arterial al menos cada uno o doYaleLa hipertensin arterial que persiste debe tratarse con medicamentos si la prdida de peso y el ejercicio no son efectivos.  Si tiene entre [id number], consulte a su mdico si debe tomar aspirina para prevenir ictus.  Los anC.H. Robinson Worldwidee deteccin de la diabetes se realizan extrayendo una muFort Pierce Northe saGreen Seaara verificar el nivel sanguneo de glucosa despus de no haber comido durante determinado perodo (ayFredonia Si usted no tiene sobrepeso ni factores de riesgo de diabetes, deben hacerle estos anlisis una vez cada 3 aos a partir de los 4534os. Si usted tiene sobrepeso u obesidad y su edad es de 40 a 7066os, deben hacerle anlisis de deProgramme researcher, broadcasting/film/videoe la diabetes todos los aos como parte de la evaluacin del riesgo cardiovascular.  Las evaluaciones para dePublic affairs consultante mama son un mtodo preventivo fundamental para las mujeres. Debe practicar la "autoconciencia de las mamas". Esto significa que deChief Technology Officerpariencia normal de sus mamas y cmo se sienten, y haElectrical engineern autoexamen de maGlass blower/designerSi detecta algn cambio, no importa cun pequeo sea, debe informarlo a su mdico. Las muConAgra Foods0 y 3063os deben hacerse un examen clnico de las mamas como  parte del examen regular de saMagnoliacada uno a tres aos. Despus de los [address]deben haCoca-ColaA partir de los [id number]deben hacerse una mamografa (radiografa de mamas) cada ao. Las mujeres con antecedentes familiares de cncer de mama deben hablar con el mdico para someterse a un estudio gentico. Las que tienen ms riesgo deben hacerse una resonancia magntica y unLavinia Sharpsodos loUnadilla La evaluacin del riesgo de cncer relacionado con el gen del cncer de mama (BRCA) se recomienda a mujeres que tengan familiares con cncer relacionado con el BRCA. Los cnceres relacionados con el BRCA incluyen el cncer de mama, de ovario, de trompas y peritoneal. TeRaynelle Janamiliares con estos tumores malignos puede estar asociado con un mayor riesgo de cambios dainos (mutaciones) en los genes del cncer de mama BRCA1 y BRCA2. Los resultados de la evaluacin determinarn la necesidad de asesoramiento gentico y de anKingsleye BRCA1 y BRCA2.  El mdico puede recomendarle que se haga pruebas peridicas de deteccin de cncer de los rganos de la pelvis (ovarios, tero y vagina). Estas pruebas incluyen un examen plvico, que abarca controlar si se produjeron cambios microscpicos en la superficie del cuello del tero (prueba de Papanicolaou). Pueden recomendarle  que se haga estas pruebas cada 3aos, a partir de los 21aos.  A las mujeres que tienen entre 30 y 71aos, los mdicos pueden recomendarles que se sometan a exmenes plvicos y pruebas de Papanicolaou cada 98aos, o a la prueba de Papanicolaou y el examen plvico en combinacin con estudios de deteccin del virus del papiloma humano (VPH) cada 5aos. Algunos tipos de VPH aumentan el riesgo de Chief Financial Officer de cuello del tero. La prueba para la deteccin del VPH tambin puede realizarse a mujeres de cualquier edad cuyos resultados de la prueba de Papanicolaou no sean claros.  Es posible que otros mdicos no recomienden exmenes de  deteccin a mujeres no embarazadas que se consideran sujetos de bajo riesgo de Chief Financial Officer de pelvis y que no tienen sntomas. Pregntele al mdico si un examen plvico de deteccin es adecuado para usted.  Si ha recibido un tratamiento para Science writer cervical o una enfermedad que podra causar cncer, necesitar realizarse una prueba de Papanicolaou y controles durante al menos 92 aos de concluido el [place]. Si no se ha hecho el Papanicolaou con regularidad, debern volver a evaluarse los factores de riesgo (como tener un nuevo compaero sexual), para Teacher, adult education si debe realizarse los estudios nuevamente. Algunas mujeres sufren problemas mdicos que aumentan la probabilidad de Museum/gallery curator cncer de cuello del tero. En estos casos, el mdico podr [employer] se realicen controles y pruebas de Papanicolaou con ms frecuencia.  El cncer colorrectal puede detectarse y con frecuencia puede prevenirse. La mayor parte de los estudios de rutina se deben Medical laboratory scientific officer a Field seismologist a Proofreader de los 45 aos y Twin 20 aos. Sin embargo, el mdico podr aconsejarle que lo haga antes, si tiene factores de riesgo para el cncer de colon. Una vez por ao, el mdico le dar un kit de prueba para Hydrologist en la materia fecal. La utilizacin de un tubo con una pequea cmara en su extremo para examinar directamente el colon (sigmoidoscopia o colonoscopia), puede detectar formas tempranas de cncer colorrectal. Hable con su mdico si tiene 64 aos, edad en la que debe comenzar a Optometrist los estudios de [place]. El examen directo del colon debe repetirse cada 5 a 10aos, hasta los 35aos, excepto que se encuentren formas tempranas de plipos precancerosos o pequeos bultos.  Las personas con un riesgo mayor de Insurance risk surveyor hepatitis B deben realizarse anlisis para Futures trader virus. Se considera que tiene un alto riesgo de Museum/gallery curator hepatitis B si:  Naci en un pas donde la hepatitis B es frecuente. Pregntele a su  mdico qu pases son considerados de Public affairs consultant.  Sus padres nacieron en un pas de alto riesgo y usted no recibi una vacuna que lo proteja contra la hepatitisB (vacuna contra la hepatitisB).  [place].  [place] agujas para inyectarse drogas.  Vive o tiene sexo con alguien que tiene hepatitis B.  Recibe tratamiento de hemodilisis.  Toma ciertos medicamentos para Nurse, mental health, trasplante de rganos y afecciones autoinmunes.  Se recomienda realizar un anlisis de sangre para Hydrographic surveyor hepatitis C a todas las personas nacidas entre 1945 y 1965, y a todo aquel que sepa que tiene riesgo de haber contrado esta enfermedad.  Practique el sexo seguro. Use condones y evite las prcticas sexuales riesgosas para disminuir el contagio de enfermedades de transmisin sexual (ETS). Algunas ETS son la gonorrea, clamidia, sfilis, tricomoniasis, herpes, VPH y el virus de inmunodeficiencia humana (VIH). El herpes, el VIH y Farmer VPH son enfermedades virales que  no tienen cura. Pueden producir discapacidad, cncer y [employer].  Debe realizarse pruebas de deteccin de enfermedades de transmisin sexual (ETS), incluidas gonorrea y clamidia si:  Es sexualmente activa y es menor de 24aos.  Es mayor de 24aos, y Investment banker, operational informa que corre riesgo de tener este tipo de [place].  La actividad sexual ha cambiado desde que le hicieron la ltima prueba de deteccin y tiene un riesgo mayor de Best boy clamidia o Radio broadcast assistant. Pregntele al mdico si usted tiene riesgo.  Si tiene riesgo de infectarse por el VIH, se recomienda tomar diariamente un medicamento recetado para evitar la infeccin. Esto se conoce como profilaxis previa a la exposicin. Se considera que est en riesgo si:  Es [place] sexualmente y no [place] preservativos habitualmente o no conoce el estado del VIH de sus Advertising copywriter.  Se inyecta drogas.  Es [place] sexualmente con Ardelia Mems pareja que tiene VIH.  Consulte a su  mdico para saber si tiene un alto riesgo de infectarse por el VIH. Si opta por comenzar la profilaxis previa a la exposicin, primero debe realizarse anlisis de deteccin del VIH. Luego, le harn anlisis cada 72mses mientras est tomando los medicamentos para la profilaxis previa a la exposicin.  La osteoporosis es una enfermedad en la que los huesos pierden los minerales y la fuerza por el avance de la edad. El resultado pueden ser fracturas o quebraduras graves en lSantee El riesgo de osteoporosis puede identificarse con uArdelia Memsprueba de densidad sea. Las mujeres de ms de 686aos y las que tengan riesgos de sufrir fracturas u osteoporosis deben discutir las opciones de control con su mdico. Consulte a su mdico si debe tomar un suplemento de calcio o de vitamina D para reducir el riesgo de osteoporosis.  La menopausia est asociada a sntomas y riesgos fsicos. Se dispone de una terapia de reemplazo hormonal para disminuir los sntomas y lMabscott Consulte a su mdico para saber si la terapia de reemplazo hormonal es conveniente para usted.  Utilice pantalla solar. Aplique pantalla solar de mKerry Doryy repetida a lo largo del dTraining and development officer Resgurdese del sol cuando la sombra sea ms pequea que usted. Protjase usando mangas y pantalones largos, un sombrero de ala ancha y anteojos para el sol todo el ao, siempre que se encuentre al aHaines[place]  Una vez por mes hgase un examen de la piel de todo el cuerpo usando un espejo para ver la espalda. Informe al mdico si aparecen nuevos lunares, o si nota que los que ya tena ahora tienen bordes iBristol aumentaron su tamao y son ms grandes que una goma de lpiz o si su forma o color cambi.  Mantngase al da con las vacunas obligatorias.  Vacuna antigripal. Todas las personas adultas deben vacunarse cada ao.  Vacuna contra la difteria, el ttanos y lResearch officer, trade union(Td, Tdap). Las mujeres embarazadas deben recibir una dosis de la vacuna  Tdap en cada embarazo. Se debe recibir la dosis independientemente de cunto tiempo haya transcurrido desde la ltima dosis. Es preferible vacunarse entre la semana 215y 374de la gestacin. Una persona adulta que no haya recibido la vacuna Tdap anteriormente o que no sabe su estado de vacunacin debe recibir una dosis. Despus de esta dosis inicial, necesitar aplicarse un refuerzo de la vacuna contra la difteria y el ttanos (Td) cada 10 aos. Las pEstée Lauderno sepan o no hayan recibido la serie de vacunacin de tres dosis contra la difteria y  el ttanos deben iniciar o finalizar una serie de vacunacin primaria, que incluye la dosis contra la difteria, el ttanos y Research officer, trade union (Tdap). Las personas adultas deben recibir una dosis de refuerzo de Td cada 10 aos.  Vacuna contra la varicela. Ardelia Mems persona adulta sin prueba de inmunidad a la varicela debe recibir dos dosis o una segunda dosis si recibi una dosis previamente. Las embarazadas sin prueba de inmunidad deben recibir la primera dosis despus del Media planner. Esta primera dosis se debe aplicar antes del alta del centro de salud. La segunda dosis debe aplicarse entre 4 y 8 semanas posteriores a la primera dosis.  Vacuna contra el virus del Engineer, technical sales (VPH). Las ConAgra Foods 13 y 36 aos que no hayan recibido la vacuna antes deben recibir la serie de 3 dosis. La vacuna no se recomienda en mujeres embarazadas. Sin embargo, no es Chartered loss adjuster una prueba de [place] antes de recibir una dosis. Si se descubre que una mujer est embarazada despus de recibir la dosis, no se Producer, television/film/video. En ese caso, las dosis restantes deben retrasarse hasta despus del embarazo. Se recomienda la vacuna para cualquier persona inmunodeprimida hasta la edad de 26 aos si no recibi [place] o ninguna de las dosis anteriormente. Durante la serie de 3 dosis, la segunda dosis debe Enterprise Products 4 y 8 semanas posteriores a la primera dosis. La  tercera dosis debe aplicarse 24 semanas despus de la primera dosis y 16 semanas despus de la segunda dosis.  Vacuna contra el herpes zoster. Se recomienda una dosis en personas [employer] de 15 aos a menos que sufran ciertas enfermedades.  Vacuna contra el sarampin, la rubola y las paperas ([place]). Los adultos nacidos antes de 1957 generalmente se consideran inmunes al sarampin y las paperas. Las Forensic scientist en [phone] o posteriormente deben recibir una o ms dosis de la vacuna SRP, a menos que [employer] contraindicacin para la vacuna o que tengan prueba de inmunidad a las tres enfermedades. Se debe aplicar una segunda dosis de rutina de la vacuna SRP al menos 28das despus de la primera dosis a estudiantes de escuelas terciarias, trabajadores de la salud o viajeros internacionales. Las personas que recibieron la vacuna inactivada contra el sarampin o algn tipo desconocido de vacuna contra el sarampin [place] y 1967 deben recibir dos dosis de la vacuna [place]. Las Advertising copywriter recibieron la vacuna inactivada contra las paperas o algn tipo desconocido de vacuna contra las paperas antes de 1979 y tienen un alto riesgo de infectarse con la enfermedad deben considerar vacunarse con dos dosis de la vacuna SRP. En las mujeres en edad frtil, debe determinarse la inmunidad contra la rubola. Si no hay prueba de inmunidad, las mujeres que no estn embarazadas deben vacunarse. Si no hay prueba de inmunidad, las mujeres que estn embarazadas deben retrasar la vacunacin hasta despus del [place]. Los trabajadores de [employer]	[place] no vacunados que nacieron antes de 1957 y que no tienen prueba de inmunidad contra el sarampin, la rubola y las paperas o no tienen confirmacin de laboratorio de la enfermedad deben considerar vacunarse contra el sarampin y las paperas con dos dosis de la vacuna [place], y contra la rubola con una dosis de la vacuna SRP.  Vacuna antineumoccica conjugada 13 valente  (PCV13). Segn indicacin mdica, una persona que no conozca su historia de vacunacin y no tenga registro de vacunas debe recibir la vacuna PCV13. Todos los adultos de 65 aos o ms deben recibir  esta vacuna. Una persona de 19 aos o ms que tenga ciertas enfermedades y no se haya vacunado debe recibir una dosis de la vacuna PCV13. Despus de esta vacuna, se debe aplicar una dosis de la vacuna antineumoccica de polisacridos (PPSV23). Los adultos con alto riesgo de enfermedad neumoccica deben recibir la vacuna PPSV23 al menos 8 semanas despus de la dosis de la vacuna PCV13. Los adultos de ms de 65 aos cuyo sistema inmunitario funcione normalmente deben recibir la dosis de la vacuna PPSV23 al menos 1 ao despus de la dosis de la vacuna PCV13.  Vacuna antineumoccica de polisacridos (PPSV23). Si se indica la vacuna PCV13, primero debe recibir la vacuna PCV13. Todas las personas de 65 aos o mayores deben recibir la vacuna. Una Network engineer de 21 aos que tenga ciertas enfermedades se Teacher, English as a foreign language. Cleora Fleet persona que viva en un hogar de Mining engineer o en un centro de atencin durante mucho tiempo se debe vacunar. Un fumador adulto se Teacher, English as a foreign language. Las personas inmunodeprimidas o con otras enfermedades deben recibir ambas vacunas, PCV13 y PPSV23. Las personas infectadas con el virus de la inmunodeficiencia humana (VIH) deben recibir la vacuna lo antes posible despus del diagnstico. Se debe evitar la vacunacin durante tratamientos de quimioterapia y radioterapia. El uso de rutina de la vacuna PPSV23 no est recomendado para Teacher, early years/pre, personas nativas de [place] o [employer] de 65aos, salvo que tengan ciertas enfermedades que requieran la vacuna. Segn indicacin mdica, las personas que no conozcan su historia de vacunacin y no tengan registros de [place], deben recibir la vacuna PPSV23. Se recomienda una nica revacunacin 5 aos despus de recibir la primera dosis de PPSV23 para personas de 19 a 68 aos  con insuficiencia renal crnica, sndrome nefrtico, asplenia o inmunodepresin. Las [place] [employer] recibieron de una a dos dosis de PPSV23 antes de los 65 aos deben recibir otra dosis de [place] a los 65 aos de edad o posteriormente si pasaron cinco aos, como mnimo, desde la dosis anterior. Las dosis de PPSV23 no son necesarias para personas que ya recibieron la vacuna a los 79 aos o posteriormente.  Vacuna antimeningoccica. Los adultos con asplenia o con persistentes deficiencias de componentes terminales del complemento deben recibir dos dosis de la vacuna antimeningoccica conjugada tetravalente (MenACWY-D). Las dosis se deben Midwife con un mnimo de 2 meses de diferencia. Deben vacunarse los microbilogos que trabajan con ciertas bacterias meningoccicas, reclutas militares y personas que viajan o viven en pases con una alta tasa de meningitis. Los estudiantes universitarios de Tourist information centre manager la edad de 21 que vivan en una residencia estudiantil deben recibir una dosis si no se aplicaron la vacuna cuando cumplieron o despus de cumplir 16 aos. Las personas que sufren ciertas enfermedades de alto riesgo deben aplicarse una o ms dosis.  Vacuna contra la hepatitis A. Las Advertising copywriter deseen estar protegidas contra esta enfermedad, que sufren ciertas enfermedades de alto riesgo, que trabajan con animales infectados con hepatitis A, que trabajan en los laboratorios de investigacin de hepatitis A, o que viajan o trabajan en pases con una alta tasa de hepatitis A deben recibir la vacuna. Los personas que no fueron vacunadas previamente y Deno Etienne a tener un contacto cercano con una persona adoptada fuera del pas, deben recibir la vacuna durante los primeros [address] despus de su llegada a los [place] desde un pas con una alta tasa de hepatitis A.  Vacuna contra la hepatitis B. Las [place] [employer] deseen estar protegidas contra esta enfermedad,  que sufren ciertas enfermedades de alto riesgo,  que puedan estar expuestas a sangre u otros fluidos corporales infecciosos, que tienen contactos familiares o parejas sexuales con hepatitis B positivo, que sean clientes o trabajadores de ciertos centros de atencin, o que viajan o trabajan en pases con una alta tasa de hepatitis B deben recibir la vacuna.  Vacuna antihaemophilus influenzae tipoB (Hib). Una persona no vacunada previamente, que sufra de asplenia o de anemia drepanoctica, o que tenga una esplenectoma programada debe recibir una dosis de la vacuna Hib. Independientemente de la vacunacin previa, un paciente trasplantado con clulas madre hematopoyticas debe recibir Ardelia Mems serie de tres dosis, de 6 a 12 meses despus del trasplante exitoso. La vacuna [place] no est recomendada para personas adultas infectadas con VIH. Controles preventivos - Frecuencia Entre 48 y 59aos  Control de la presin arterial.**/Cada 3 a 5 aos.  Control de lpidos y colesterol.**Carma Lair cinco aos a partir de los 20 aos.  Examen clnico de Johnson [employer] Johnson.**Carma Lair 3 aos en las Principal Financial 20 y los 25 aos.  Evaluacin del riesgo de cncer relacionado con el BRCA.**/Para mujeres que tienen familiares con cncer relacionado con el BRCA (cncer de mama, de ovario, de trompas y peritoneal).  Prueba de Papanicolaou.**Carma Lair dos [employer]isource[employer] 21 y los 37 aos. Cada tres aos a Proofreader de los 9 aos y [place] 65 o 24 aos, con una historia de tres pruebas de Papanicolaou normales consecutivas.  Pruebas de deteccin de VPH.**/Cada tres aos, a partir de los 24 aos y Franquez 32 o 67 aos, con una historia de tres pruebas de Papanicolaou normales consecutivas.  Anlisis de sangre para la hepatitis C.**/Para toda persona con riesgos conocidos de hepatitis C.  Autoexamen de piel [employer].  Vacuna antigripal. [place] los aos.  Vacuna contra la difteria, el ttanos y Research officer, trade union (Tdap, Td).**/Consulte a su mdico. Las mujeres embarazadas deben  recibir una dosis de la vacuna Tdap en cada embarazo. 1dosis de Td cada 10aos.  Vacuna contra la varicela.**/Consulte a su mdico. Las embarazadas sin prueba de inmunidad deben recibir la primera dosis despus del Media planner.  Vacuna contra el VPH. /3 dosis en el curso de 6 meses, si tiene 78 aos o menos. La vacuna no se recomienda en mujeres embarazadas. Sin embargo, no es Chartered loss adjuster una prueba de [place] antes de recibir una dosis.  Vacuna contra el sarampin, la rubola y las paperas ([place]).Marland KitchenEarleen Newport aplicarse al menos una dosis de SRP si ha nacido en 1957 o despus. Podra tambin necesitar una segunda dosis. En las mujeres en edad frtil, debe determinarse la inmunidad contra la rubola. Si no hay prueba de inmunidad, las mujeres que no estn embarazadas deben vacunarse. Si no hay prueba de inmunidad, las mujeres que estn embarazadas deben retrasar la vacunacin hasta despus del [place].  Vacuna antineumoccica conjugada 13 valente (PCV13).Marland KitchenCecille Amsterdam a su mdico.  Vacuna antineumoccica de polisacridos (PPSV23).**/De una a dos dosis si es fumador o si sufre Actuary.  Vacuna antimeningoccica.**/Si tiene entre 80 y 7 aos y es estudiante universitario de Editor, commissioning que vive en una residencia estudiantil o tiene alguna enfermedad grave, debe recibir Ardelia Mems dosis de esta vacuna. Podra tambin necesitar dosis de refuerzo.  Vacuna contra la hepatitis A.**/Consulte a su mdico.  Vacuna contra la hepatitis B.**/Consulte a su mdico.  Vacuna antihaemophilus influenzae tipoB (Hib).**/Consulte a su mdico. Entre 40 y 31aos  Control de la presin Runner, broadcasting/film/video.  Control de lpidos y colesterol.Marland KitchenCarma Lair cinco  aos a [employer] 88 aos.  Pruebas de deteccin de cncer de pulmn. /Todos los aos si tiene entre 38 y 80aos, y si ha fumado durante 30aos un paquete diario y sigue fumando o dej el hbito en algn momento en los ltimos 15aos. Los estudios  de Pharmacologist se interrumpen cuando haya dejado de fumar durante al menos 15aos o si tiene un problema de salud que le impida recibir tratamiento para Science writer de pulmn.  Examen clnico de [employer].**/Todos los aos despus de los 40 aos.  Evaluacin del riesgo de cncer relacionado con el BRCA.**/Para mujeres que tienen familiares con cncer relacionado con el BRCA (cncer de mama, de ovario, de trompas y peritoneal).  Mamografa.**/Una vez por ao a partir de los 11 aos, siempre que tenga buena salud. Consulte a su mdico.  Prueba de Papanicolaou.Marland KitchenCarma Lair tres aos despus de los 17 aos y [place] 4 o 26 aos, con una historia de tres pruebas de Papanicolaou normales consecutivas.  Pruebas de deteccin de VPH.**/Cada tres aos, a partir de los 71 aos y [place] 61 o 59 aos, con una historia de tres pruebas de Papanicolaou normales consecutivas.  Prueba de Personnel officer en las heces [facility]). Carma Lair ao a partir de los 15aos y [facility] 2aos. No tendr que hacerlo si se realiza una colonoscopia cada 10 aos.  Colonoscopia o sigmoidoscopia flexible.Marland KitchenCarma Lair 5 aos para la sigmoidoscopia flexible o cada 10 aos para la colonoscopia, a Proofreader de los 48 aos de edad y [place] 8 aos.  Anlisis de sangre para la hepatitis C.**/Para todas las personas nacidas entre 1945 y 1965, y a todo aquel que tenga un riesgo conocido de haber contrado esta enfermedad.  Autoexamen de piel [employer].  Vacuna antigripal. [place] los aos.  Vacuna contra la difteria, el ttanos y Research officer, trade union (Tdap, Td).**/Consulte a su mdico. Las mujeres embarazadas deben recibir una dosis de la vacuna Tdap en cada embarazo. 1dosis de Td cada 10aos.  Vacuna contra la varicela.**/Consulte a su mdico. Las embarazadas sin prueba de inmunidad deben recibir la primera dosis despus del Media planner.  Vacuna contra el herpes zoster.Marland KitchenArdelia Mems dosis para adultos de 60 aos o ms.  Vacuna contra el  sarampin, la rubola y las paperas ([place]).Marland KitchenEarleen Newport aplicarse al menos una dosis de SRP si ha nacido en 1957 o despus. Podra tambin necesitar una segunda dosis. En las mujeres en edad frtil, debe determinarse la inmunidad contra la rubola. Si no hay prueba de inmunidad, las mujeres que no estn embarazadas deben vacunarse. Si no hay prueba de inmunidad, las mujeres que estn embarazadas deben retrasar la vacunacin hasta despus del [place].  Vacuna antineumoccica conjugada 13 valente (PCV13).Marland KitchenCecille Amsterdam a su mdico.  Vacuna antineumoccica de polisacridos (PPSV23).**/De una a dos dosis si es fumador o si sufre Actuary.  Vacuna antimeningoccica.Marland KitchenCecille Amsterdam a su mdico.  Investment banker, operational contra la hepatitis A.**/Consulte a su mdico.  Vacuna contra la hepatitis B.**/Consulte a su mdico.  Vacuna antihaemophilus influenzae tipoB (Hib).**/Consulte a su mdico. Ms de 42 aos  Control de la presin Runner, broadcasting/film/video.  Control de lpidos y colesterol.**Carma Lair cinco aos a partir de los 20 aos.  Pruebas de deteccin de cncer de pulmn. /Todos los aos si tiene entre 28 y 80aos, y si ha fumado durante 30aos un paquete diario y sigue fumando o dej el hbito en algn momento en los ltimos 15aos. Los estudios de Pharmacologist se interrumpen cuando haya dejado de fumar durante al menos 15aos  o si tiene un problema de salud que le impida recibir tratamiento para Science writer de pulmn.  Examen clnico de [employer].**/Todos los aos despus de los 40 aos.  Evaluacin del riesgo de cncer relacionado con el BRCA.**/Para mujeres que tienen familiares con cncer relacionado con el BRCA (cncer de mama, de ovario, de trompas y peritoneal).  Mamografa.**/Una vez por ao a partir de los 64 aos, siempre que tenga buena salud. Consulte a su mdico.  Prueba de Papanicolaou.**Carma Lair tres aos despus de los 41 aos y [place] 65 o 51 aos, con tres pruebas de Papanicolaou  normales consecutivas. Las pruebas pueden interrumpirse [employer] 66 y los 9 aos, si tiene tres pruebas de Papanicolaou normales consecutivas y ninguna prueba de Papanicolaou ni de VPH anormal en los ltimos 10 aos.  Pruebas de deteccin de VPH.**/Cada tres aos, a partir de los 86 aos y [place] 57 o 15 aos, con una historia de tres pruebas de Papanicolaou normales consecutivas. Las pruebas pueden interrumpirse [employer] 77 y los 67 aos, si tiene tres pruebas de Papanicolaou normales consecutivas y ninguna prueba de Papanicolaou ni de VPH anormal en los ltimos 10 aos.  Prueba de Personnel officer en las heces [facility]). Carma Lair ao a partir de los 84aos y [facility] 48aos. No tendr que hacerlo si se realiza una colonoscopia cada 10 aos.  Colonoscopia o sigmoidoscopia flexible.Marland KitchenCarma Lair 5 aos para la sigmoidoscopia flexible o cada 10 aos para la colonoscopia, a Proofreader de los 84 aos de edad y [place] 4 aos.  Anlisis de sangre para la hepatitis C.**/Para todas las personas nacidas entre 1945 y 1965, y a todo aquel que tenga un riesgo conocido de haber contrado esta enfermedad.  Pruebas de deteccin de osteoporosis.Marland KitchenWesley Blas nica vez en mujeres de ms de 81 aos que tengan riesgo de fracturas u osteoporosis.  Autoexamen de piel [employer].  Vacuna antigripal. [place] los aos.  Vacuna contra la difteria, el ttanos y Research officer, trade union (Tdap/Td).**/Una dosis de Td cada 10 aos.  Vacuna contra la varicela.**Cecille Amsterdam a su mdico.  Vacuna contra el herpes zoster.Marland KitchenArdelia Mems dosis para adultos de 60 aos o ms.  Vacuna antineumoccica conjugada 13 valente (PCV13).Marland KitchenCecille Amsterdam a su mdico.  Vacuna antineumoccica de polisacridos (PPSV23).Marland KitchenArdelia Mems dosis para todos los adultos de 65 aos o ms.  Vacuna antimeningoccica.Marland KitchenCecille Amsterdam a su mdico.  Investment banker, operational contra la hepatitis A.**/Consulte a su mdico.  Vacuna contra la hepatitis B.**/Consulte a su mdico.  Vacuna antihaemophilus  influenzae tipoB (Hib).**/Consulte a su mdico. ** Los antecedentes familiares y personales de [place] y enfermedades pueden [place]ar[place] manager las recomendaciones del mdico.   Esta informacin no tiene Marine scientist el consejo del mdico. Asegrese de hacerle al mdico cualquier pregunta que tenga.   Document Released: 07/27/2005 Document Revised: 11/07/2014 Elsevier Interactive Patient Education [employer].

## 2016-09-08 NOTE — Progress Notes (Signed)
   Subjective:     Andrea BarrierJeaneth Ellis is a 30 y.o. female and is here for a comprehensive physical exam. The patient reports problems - recent appendectomy, still having some abdominal pain at incisions and  constipation. also reports monthly menses for about 3 days of normal bleeding since Mirena placed..  Social History   Social History  . Marital status: Married    Spouse name: N/A  . Number of children: N/A  . Years of education: N/A   Occupational History  . manufactoring    Social History Main Topics  . Smoking status: Never Smoker  . Smokeless tobacco: Never Used  . Alcohol use No  . Drug use: No  . Sexual activity: Yes    Partners: Male    Birth control/ protection: IUD     Comment: mirena   Other Topics Concern  . Not on file   Social History Narrative  . No narrative on file   Health Maintenance  Topic Date Due  . PNEUMOCOCCAL POLYSACCHARIDE VACCINE (1) 08/27/1989  . FOOT EXAM  08/27/1997  . OPHTHALMOLOGY EXAM  08/27/1997  . URINE MICROALBUMIN  08/27/1997  . INFLUENZA VACCINE  05/31/2016  . HEMOGLOBIN A1C  12/21/2016  . PAP SMEAR  09/30/2018  . TETANUS/TDAP  01/13/2026  . HIV Screening  Completed    The following portions of the patient's history were reviewed and updated as appropriate: allergies, current medications, past family history, past medical history, past social history, past surgical history and problem list.  Review of Systems Pertinent items noted in HPI and remainder of comprehensive ROS otherwise negative.   Objective:    General appearance: alert, cooperative, appears stated age and mildly obese Neck: no adenopathy, no carotid bruit, no JVD, supple, symmetrical, trachea midline and thyroid not enlarged, symmetric, no tenderness/mass/nodules Lungs: clear to auscultation bilaterally Breasts: normal appearance, no masses or tenderness Heart: regular rate and rhythm, S1, S2 normal, no murmur, click, rub or gallop Abdomen: soft, non-tender;  bowel sounds normal; no masses,  no organomegaly and umbilical incision and peri-mons incision well healed Pelvic: cervix normal in appearance, external genitalia normal, no adnexal masses or tenderness, no cervical motion tenderness, rectovaginal septum normal, uterus normal size, shape, and consistency and vagina normal without discharge  IUD string not seen (noted on CT from 08/22/16)   Assessment:    Healthy female exam. IUD check     Plan:  Pap obtained RTC 1 year or as needed    See After Visit Summary for Counseling Recommendations

## 2016-09-09 LAB — CYTOLOGY - PAP

## 2017-02-02 ENCOUNTER — Encounter: Payer: Self-pay | Admitting: Family Medicine

## 2017-02-02 ENCOUNTER — Ambulatory Visit (INDEPENDENT_AMBULATORY_CARE_PROVIDER_SITE_OTHER): Payer: Managed Care, Other (non HMO) | Admitting: Family Medicine

## 2017-02-02 VITALS — BP 103/72 | HR 80 | Temp 98.7°F | Wt 209.0 lb

## 2017-02-02 DIAGNOSIS — J029 Acute pharyngitis, unspecified: Secondary | ICD-10-CM

## 2017-02-02 MED ORDER — LIDOCAINE VISCOUS 2 % MT SOLN: 5.0000 mL | OROMUCOSAL | 0 refills | Status: DC | PRN

## 2017-02-02 NOTE — Patient Instructions (Signed)
Follow up as needed

## 2017-02-02 NOTE — Progress Notes (Signed)
   BP 103/72   Pulse 80   Temp 98.7 F (37.1 C)   Wt 209 lb (94.8 kg)   SpO2 98%   BMI 37.02 kg/m    Subjective:    Patient ID: Andrea Ellis, female    DOB: 1985-11-10, 31 y.o.   MRN: [id number]  HPI: Andrea Ellis is a 31 y.o. female  Chief Complaint  Patient presents with  . Sore Throat    x 2 weeks, h/a, cough, some runny nose in the beginning. No chest congestion.  No fever.    Patient presents with 2 weeks of HA, dry cough, rhinorrhea, and sore, swollen throat. Trying allergy medications OTC with no relief. Daughter has been sick for several weeks now as well and has finally seemed to get better.   Relevant past medical, surgical, family and social history reviewed and updated as indicated. Interim medical history since our last visit reviewed. Allergies and medications reviewed and updated.  Review of Systems  Constitutional: Negative.   HENT: Positive for rhinorrhea and sore throat.   Eyes: Negative.   Respiratory: Positive for cough.   Cardiovascular: Negative.   Gastrointestinal: Negative.   Genitourinary: Negative.   Musculoskeletal: Negative.   Neurological: Positive for headaches.  Psychiatric/Behavioral: Negative.     Per HPI unless specifically indicated above     Objective:    BP 103/72   Pulse 80   Temp 98.7 F (37.1 C)   Wt 209 lb (94.8 kg)   SpO2 98%   BMI 37.02 kg/m   Wt Readings from Last 3 Encounters:  02/02/17 209 lb (94.8 kg)  09/08/16 211 lb 1.6 oz (95.8 kg)  09/01/16 210 lb (95.3 kg)    Physical Exam  Constitutional: She is oriented to person, place, and time. She appears well-developed and well-nourished. No distress.  HENT:  Head: Atraumatic.  Tonsillar edema present b/l, but no erythema or exudates   Eyes: Conjunctivae and EOM are normal. Pupils are equal, round, and reactive to light.  Neck: Normal range of motion. Neck supple.  Cardiovascular: Normal rate and normal heart sounds.   Pulmonary/Chest: Effort normal  and breath sounds normal. No respiratory distress.  Musculoskeletal: Normal range of motion.  Lymphadenopathy:    She has no cervical adenopathy.  Neurological: She is alert and oriented to person, place, and time.  Skin: Skin is warm and dry.  Psychiatric: She has a normal mood and affect. Her behavior is normal.  Nursing note and vitals reviewed.   Results for orders placed or performed in visit on 02/02/17  Rapid strep screen (not at [facility])  Result Value Ref Range   Strep Gp A Ag, IA W/Reflex Negative Negative  Culture, Group A Strep  Result Value Ref Range   Strep A Culture WILL FOLLOW       Assessment & Plan:   Problem List Items Addressed This Visit    None    Visit Diagnoses    Sore throat    -  Primary   Rapid strep neg, await cx. No evidence today of bacterial infection, will treat symptomatically with viscous lidocaine, OTC cold medications and zyrtec.    Relevant Orders   Rapid strep screen (not at [facility]) (Completed)       Follow up plan: Return if symptoms worsen or fail to improve.

## 2017-02-05 LAB — CULTURE, GROUP A STREP: Strep A Culture: NEGATIVE

## 2017-02-05 LAB — RAPID STREP SCREEN (MED CTR MEBANE ONLY): STREP GP A AG, IA W/REFLEX: NEGATIVE

## 2017-02-06 ENCOUNTER — Telehealth: Payer: Self-pay | Admitting: Family Medicine

## 2017-02-06 NOTE — Telephone Encounter (Signed)
Patient notified of results.

## 2017-02-06 NOTE — Telephone Encounter (Signed)
Please call pt and let her know that her strep cx was negative.

## 2017-08-11 ENCOUNTER — Ambulatory Visit: Payer: 59 | Admitting: Family Medicine

## 2017-08-31 IMAGING — CT CT ABD-PELV W/ CM
2 of 4 series · 16 of 46 positions shown, 18 images · IV contrast (APPLIED)
Comparison: None.

CLINICAL DATA: Right lower quadrant pain and dizziness beginning
yesterday. Leukocytosis.

EXAM:
CT ABDOMEN AND PELVIS WITH CONTRAST
TECHNIQUE: Multidetector CT imaging of the abdomen and pelvis was performed
using the standard protocol following bolus administration of
intravenous contrast.
CONTRAST:  100mL YCD40W-UMM IOPAMIDOL (YCD40W-UMM) INJECTION 61%

[Series 2: axial st · axial · 0.72mm/px · z∈[-966,-526]mm · 13 of 97 slices shown, 15 images]
[im 5/97  soft-tissue]
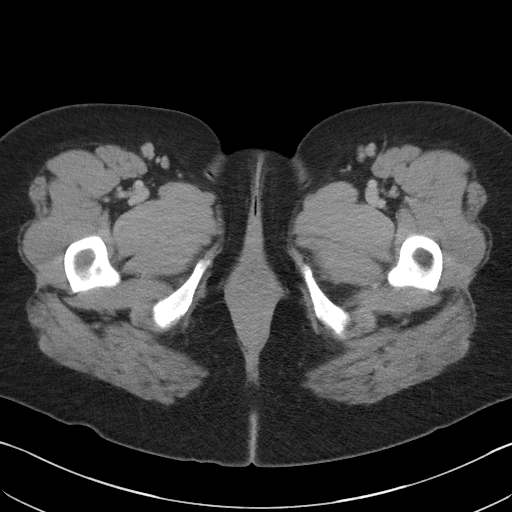
[im 5/97  bone]
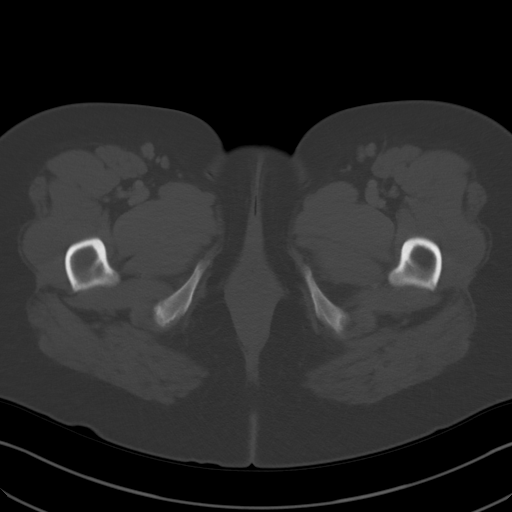
[im 13/97  soft-tissue]
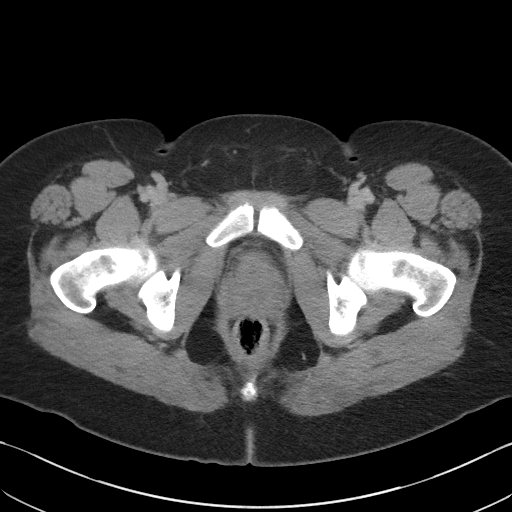
[im 21/97  soft-tissue]
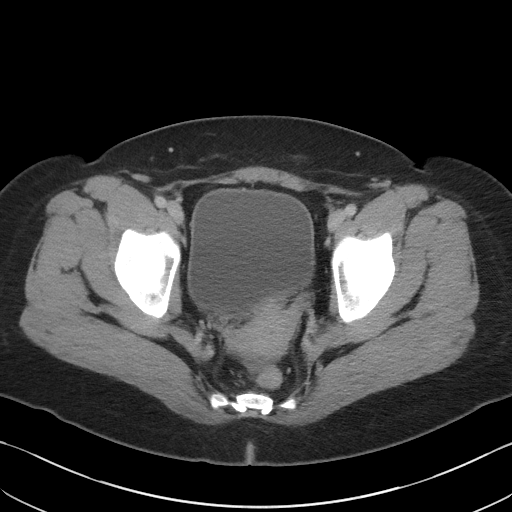
[im 29/97  soft-tissue]
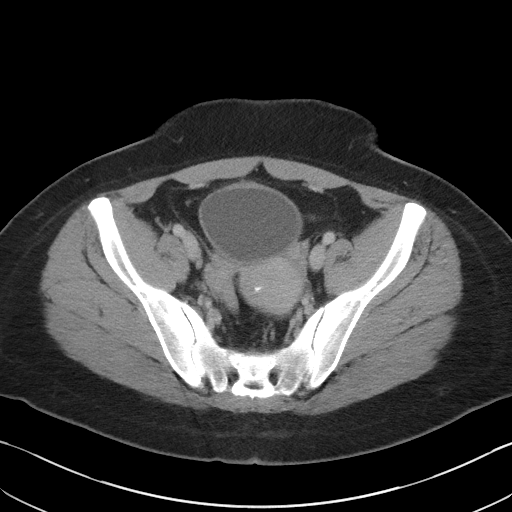
[im 33/97  soft-tissue]
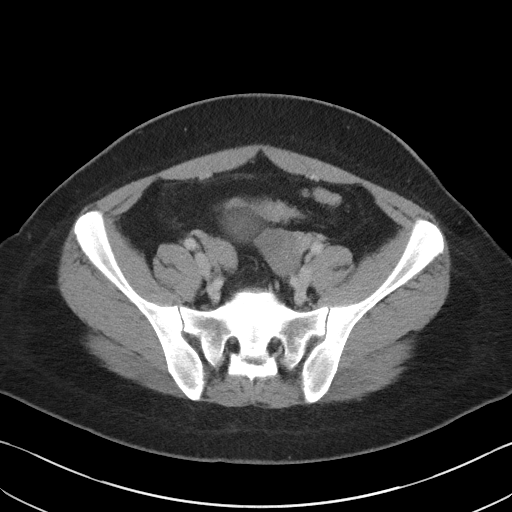
[im 41/97  soft-tissue]
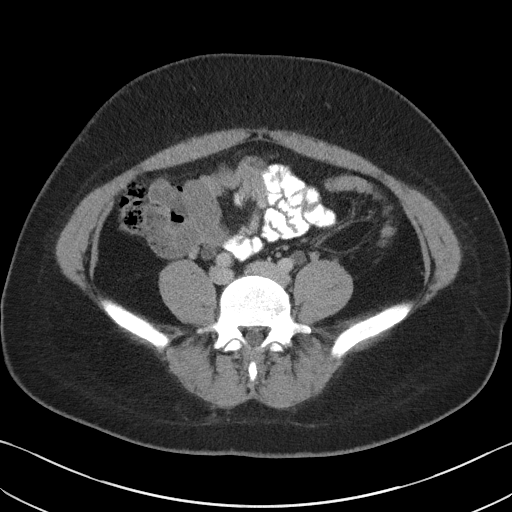
[im 49/97  soft-tissue]
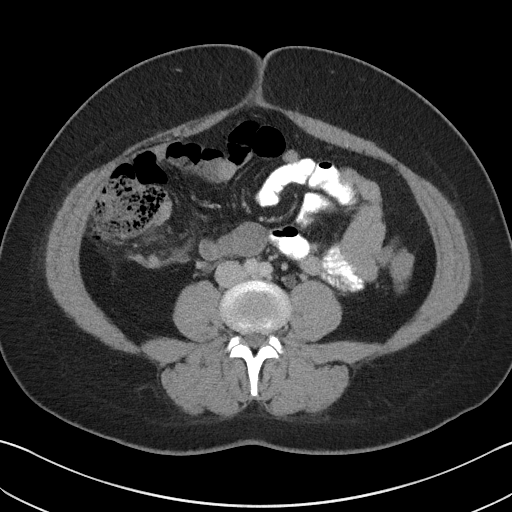
[im 57/97  soft-tissue]
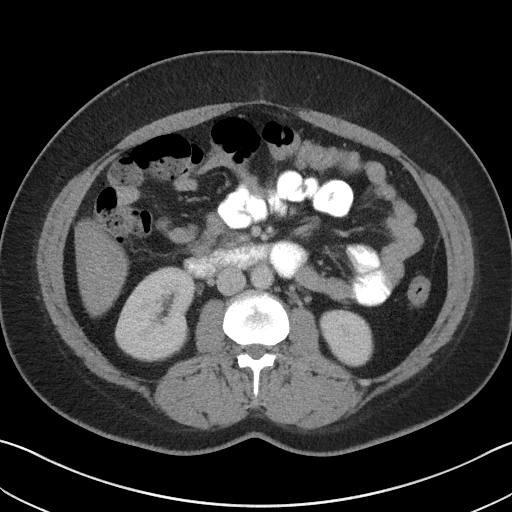
[im 65/97  soft-tissue]
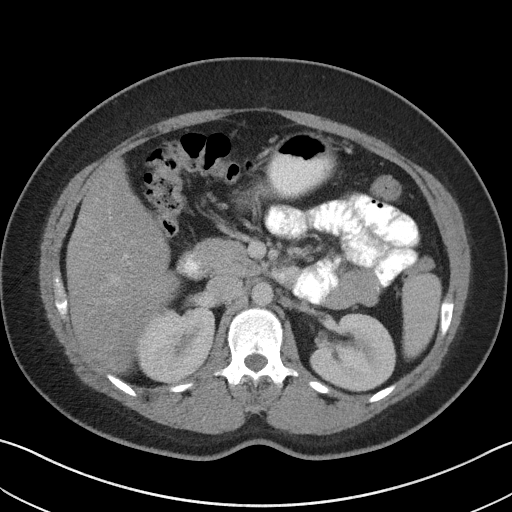
[im 65/97  bone]
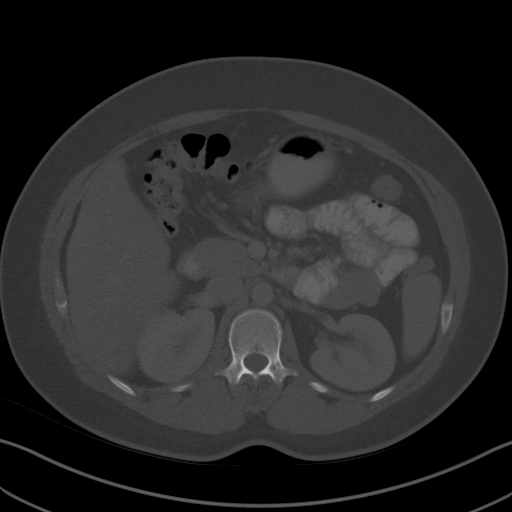
[im 69/97  soft-tissue]
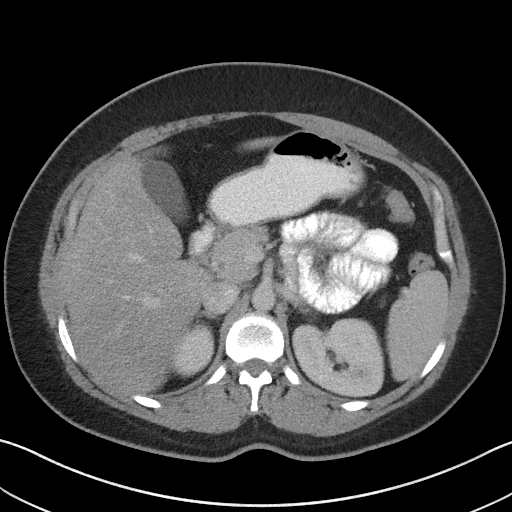
[im 77/97  soft-tissue]
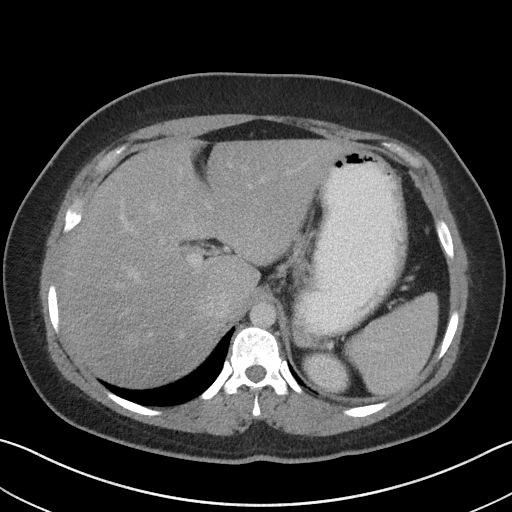
[im 85/97  soft-tissue]
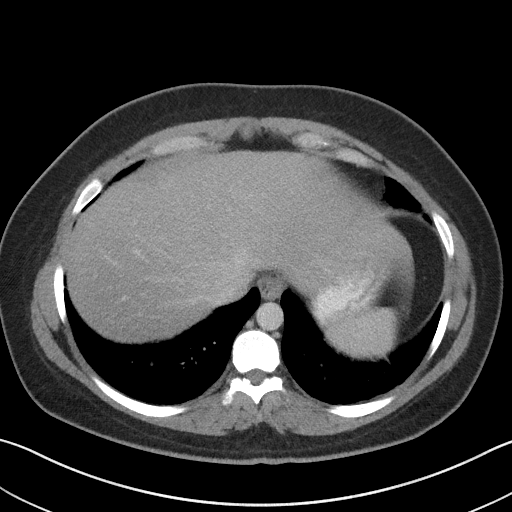
[im 93/97  soft-tissue]
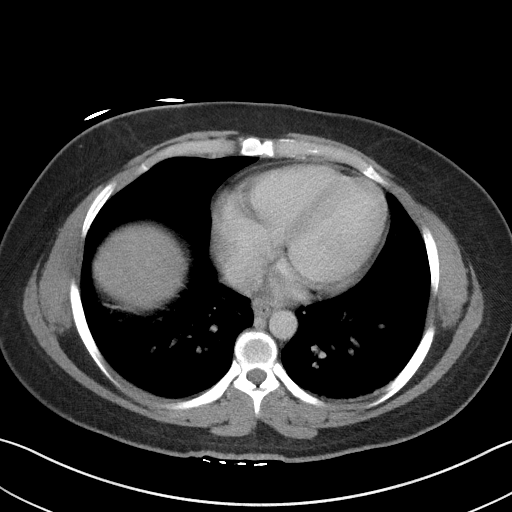

[Series 5: coronal st · coronal · 0.77mm/px · 3 of 95 slices shown]
[im 32/95  soft-tissue]
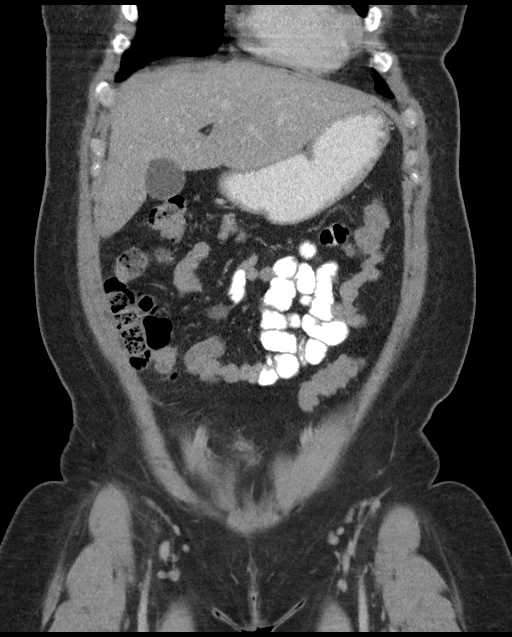
[im 42/95  soft-tissue]
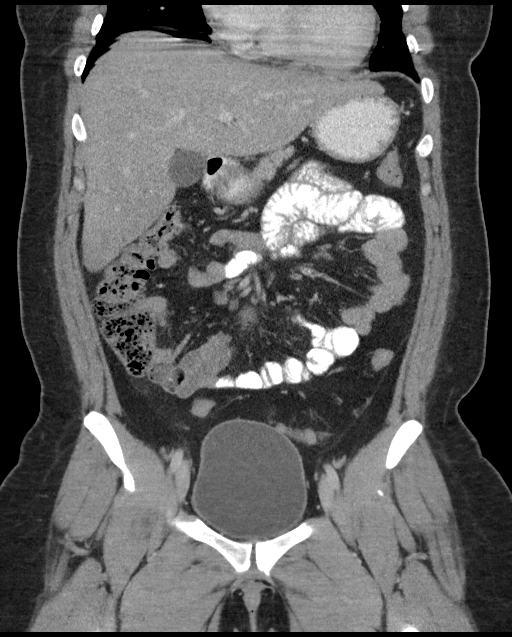
[im 53/95  soft-tissue]
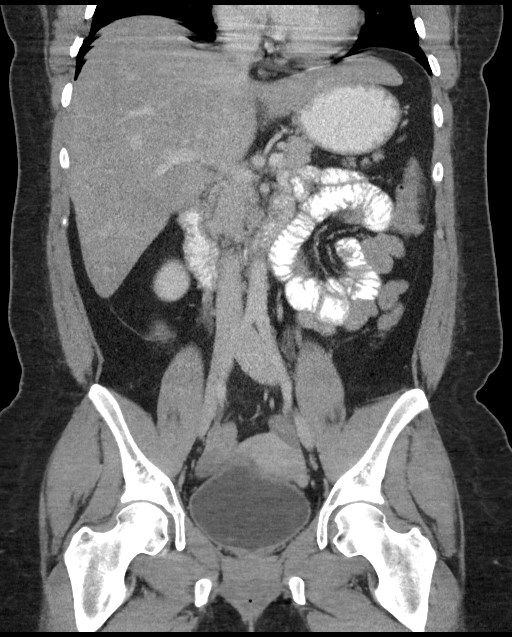

[16 of 46 positions shown; findings below may reference images not displayed]

FINDINGS: Lower Chest: No acute findings.

Hepatobiliary: No mass identified. Moderate diffuse hepatic
steatosis demonstrated. Gallbladder is unremarkable.

Pancreas:  No mass or inflammatory changes.

Spleen: Within normal limits in size and appearance.

Adrenals/Urinary Tract: No masses identified. No evidence of
hydronephrosis.

Stomach/Bowel: Enlarged appendix is seen which measures 13 mm in
diameter. Mild periappendiceal inflammatory changes are seen. These
findings are consistent with acute appendicitis. No evidence of
abscess or free fluid.

Vascular/Lymphatic: Small less than 1 cm mesenteric lymph nodes are
noted in the right lower quadrant, likely reactive in etiology. No
pathologically enlarged lymph nodes. No abdominal aortic aneurysm.

Reproductive: IUD seen in expected position in the uterus. No
adnexal masses identified.

Other:  None.

Musculoskeletal:  No suspicious bone lesions identified.
IMPRESSION: Positive for acute appendicitis. No evidence of abscess or other
complication.

IUD in appropriate position.

Moderate hepatic steatosis.

## 2017-09-08 ENCOUNTER — Ambulatory Visit: Payer: 59 | Admitting: Family Medicine

## 2017-09-08 ENCOUNTER — Encounter: Payer: Self-pay | Admitting: Family Medicine

## 2017-09-08 VITALS — BP 111/79 | HR 76 | Temp 98.2°F | Ht 63.4 in | Wt 218.5 lb

## 2017-09-08 DIAGNOSIS — B9689 Other specified bacterial agents as the cause of diseases classified elsewhere: Secondary | ICD-10-CM | POA: Diagnosis not present

## 2017-09-08 DIAGNOSIS — L2489 Irritant contact dermatitis due to other agents: Secondary | ICD-10-CM

## 2017-09-08 DIAGNOSIS — N760 Acute vaginitis: Secondary | ICD-10-CM

## 2017-09-08 DIAGNOSIS — Z0000 Encounter for general adult medical examination without abnormal findings: Secondary | ICD-10-CM | POA: Diagnosis not present

## 2017-09-08 DIAGNOSIS — N898 Other specified noninflammatory disorders of vagina: Secondary | ICD-10-CM | POA: Diagnosis not present

## 2017-09-08 LAB — WET PREP FOR TRICH, YEAST, CLUE
Clue Cell Exam: POSITIVE — AB
TRICHOMONAS EXAM: NEGATIVE
Yeast Exam: NEGATIVE

## 2017-09-08 MED ORDER — TRIAMCINOLONE ACETONIDE 0.5 % EX OINT: 1.0000 "application " | TOPICAL_OINTMENT | Freq: Two times a day (BID) | CUTANEOUS | 0 refills | Status: DC

## 2017-09-08 MED ORDER — METRONIDAZOLE 500 MG PO TABS: 500.0000 mg | ORAL_TABLET | Freq: Two times a day (BID) | ORAL | 0 refills | Status: DC

## 2017-09-08 NOTE — Progress Notes (Signed)
BP 111/79 (BP Location: Left Arm, Patient Position: Sitting, Cuff Size: Large)   Pulse 76   Temp 98.2 F (36.8 C)   Ht 5' 3.4" (1.61 m)   Wt 218 lb 8 oz (99.1 kg)   LMP  (LMP Unknown)   SpO2 99%   BMI 38.22 kg/m    Subjective:    Patient ID: Andrea Ellis, female    DOB: 1985/12/15, 31 y.o.   MRN: [id number]  HPI: Andrea Ellis is a 31 y.o. female presenting on 09/08/2017 for comprehensive medical examination seen today with the help of an interpretor. Current medical complaints include:  VAGINAL ITCHING- would like to check for her strings, doesn't check them Duration: last week Discharge description: clear  Pruritus: yes Dysuria: no Malodorous: worse after sex Urinary frequency: no Fevers: no Abdominal pain: no  Sexual activity: monogamous History of sexually transmitted diseases: no Recent antibiotic use: no Context: none  Treatments attempted: antifungal  She currently lives with: husband and child Menopausal Symptoms: no  Depression Screen done today and results listed below:  Depression screen [facility] 09/08/2017 05/05/2016 02/01/2016  Decreased Interest 0 0 0  Down, Depressed, Hopeless 1 0 0  PHQ - 2 Score 1 0 0    Past Medical History:  Past Medical History:  Diagnosis Date  . Headache    Surgical History:  Past Surgical History:  Procedure Laterality Date  . APPENDECTOMY    . KNEE SURGERY     Medications:  Current Outpatient Medications on File Prior to Visit  Medication Sig  . levonorgestrel (MIRENA) 20 MCG/24HR IUD 1 Intra Uterine Device (1 each total) by Intrauterine route once.   No current facility-administered medications on file prior to visit.    Allergies:  No Known Allergies  Social History:  Social History   Socioeconomic History  . Marital status: Married    Spouse name: Not on file  . Number of children: Not on file  . Years of education: Not on file  . Highest education level: Not on file  Social Needs  . Financial  resource strain: Not on file  . Food insecurity - worry: Not on file  . Food insecurity - inability: Not on file  . Transportation needs - medical: Not on file  . Transportation needs - non-medical: Not on file  Occupational History  . Occupation: manufactoring  Tobacco Use  . Smoking status: Never Smoker  . Smokeless tobacco: Never Used  Substance and Sexual Activity  . Alcohol use: No    Alcohol/week: 0.0 oz  . Drug use: No  . Sexual activity: Yes    Partners: Male    Birth control/protection: IUD    Comment: mirena  Other Topics Concern  . Not on file  Social History Narrative  . Not on file   Social History   Tobacco Use  Smoking Status Never Smoker  Smokeless Tobacco Never Used   Social History   Substance and Sexual Activity  Alcohol Use No  . Alcohol/week: 0.0 oz    Family History:  Family History  Problem Relation Age of Onset  . Alcohol abuse Father     Past medical history, surgical history, medications, allergies, family history and social history reviewed with patient today and changes made to appropriate areas of the chart.   Review of Systems  Constitutional: Negative.   HENT: Negative.   Eyes: Positive for blurred vision (only when not wearing her glasses). Negative for double vision, photophobia, pain, discharge and redness.  Respiratory: Negative.   Cardiovascular: Negative.   Gastrointestinal: Negative.   Genitourinary: Negative.   Musculoskeletal: Positive for back pain and myalgias. Negative for falls, joint pain and neck pain.  Skin: Negative.   Neurological: Negative.   Endo/Heme/Allergies: Negative.   Psychiatric/Behavioral: Negative.    All other ROS negative except what is listed above and in the HPI.      Objective:    BP 111/79 (BP Location: Left Arm, Patient Position: Sitting, Cuff Size: Large)   Pulse 76   Temp 98.2 F (36.8 C)   Ht 5' 3.4" (1.61 m)   Wt 218 lb 8 oz (99.1 kg)   LMP  (LMP Unknown)   SpO2 99%   BMI  38.22 kg/m   Wt Readings from Last 3 Encounters:  09/08/17 218 lb 8 oz (99.1 kg)  02/02/17 209 lb (94.8 kg)  09/08/16 211 lb 1.6 oz (95.8 kg)    Physical Exam  Constitutional: She is oriented to person, place, and time. She appears well-developed and well-nourished. No distress.  HENT:  Head: Normocephalic and atraumatic.  Right Ear: Hearing, tympanic membrane, external ear and ear canal normal.  Left Ear: Hearing, tympanic membrane, external ear and ear canal normal.  Nose: Nose normal.  Mouth/Throat: Uvula is midline, oropharynx is clear and moist and mucous membranes are normal. No oropharyngeal exudate.  Eyes: Conjunctivae, EOM and lids are normal. Pupils are equal, round, and reactive to light. Right eye exhibits no discharge. Left eye exhibits no discharge. No scleral icterus.  Neck: Normal range of motion. Neck supple. No JVD present. No tracheal deviation present. No thyromegaly present.  Cardiovascular: Normal rate, regular rhythm, normal heart sounds and intact distal pulses. Exam reveals no gallop and no friction rub.  No murmur heard. Pulmonary/Chest: Effort normal and breath sounds normal. No stridor. No respiratory distress. She has no wheezes. She has no rales. She exhibits no tenderness. Right breast exhibits no inverted nipple, no mass, no nipple discharge, no skin change and no tenderness. Left breast exhibits no inverted nipple, no mass, no nipple discharge, no skin change and no tenderness. Breasts are symmetrical.  Abdominal: Soft. Bowel sounds are normal. She exhibits no distension and no mass. There is no tenderness. There is no rebound and no guarding. Hernia confirmed negative in the right inguinal area and confirmed negative in the left inguinal area.  Genitourinary: Uterus normal.    No labial fusion. There is lesion on the right labia. There is no rash, tenderness or injury on the right labia. There is lesion on the left labia. There is no rash, tenderness or  injury on the left labia. Uterus is not deviated, not enlarged, not fixed and not tender. Cervix exhibits no motion tenderness, no discharge and no friability. Right adnexum displays no mass, no tenderness and no fullness. Left adnexum displays no mass, no tenderness and no fullness. No erythema, tenderness or bleeding in the vagina. No foreign body in the vagina. No signs of injury around the vagina. Vaginal discharge found.  Genitourinary Comments: Strings visible  Musculoskeletal: Normal range of motion. She exhibits no edema, tenderness or deformity.  Lymphadenopathy:    She has no cervical adenopathy.       Right: No inguinal adenopathy present.       Left: No inguinal adenopathy present.  Neurological: She is alert and oriented to person, place, and time. She has normal reflexes. She displays normal reflexes. No cranial nerve deficit. She exhibits normal muscle tone. Coordination normal.  Skin:  Skin is warm, dry and intact. No rash noted. She is not diaphoretic. No erythema. No pallor.  Psychiatric: She has a normal mood and affect. Her speech is normal and behavior is normal. Judgment and thought content normal. Cognition and memory are normal.  Nursing note and vitals reviewed.   Results for orders placed or performed in visit on 02/02/17  Rapid strep screen (not at [facility])  Result Value Ref Range   Strep Gp A Ag, IA W/Reflex Negative Negative  Culture, Group A Strep  Result Value Ref Range   Strep A Culture Negative       Assessment & Plan:   Problem List Items Addressed This Visit    None    Visit Diagnoses    Routine general medical examination at a health care facility    -  Primary   Relevant Orders   CBC with Differential/Platelet   Comprehensive metabolic panel   Lipid Panel w/o Chol/HDL Ratio   TSH   UA/M w/rflx Culture, Routine   Vaginal discharge       Wet prep checked today. + clue cells   Relevant Orders   WET PREP FOR TRICH, YEAST, CLUE   Irritant contact  dermatitis due to other agents       Will treat with triamcinalone. Call if not getting better or getting worse.    BV (bacterial vaginosis)       Will treat with metronidazole, call with any concerns.    Relevant Medications   metroNIDAZOLE (FLAGYL) 500 MG tablet       Follow up plan: Return in about 1 year (around 09/08/2018) for Physical.   LABORATORY TESTING:  - Pap smear: up to date  IMMUNIZATIONS:   - Tdap: Tetanus vaccination status reviewed: last tetanus booster within 10 years. - Influenza: Refused - Pneumovax: Not applicable  PATIENT COUNSELING:   Advised to take 1 mg of folate supplement per day if capable of pregnancy.   Sexuality: Discussed sexually transmitted diseases, partner selection, use of condoms, avoidance of unintended pregnancy  and contraceptive alternatives.   Advised to avoid cigarette smoking.  I discussed with the patient that most people either abstain from alcohol or drink within safe limits (<=14/week and <=4 drinks/occasion for males, <=7/weeks and <= 3 drinks/occasion for females) and that the risk for alcohol disorders and other health effects rises proportionally with the number of drinks per week and how often a drinker exceeds daily limits.  Discussed cessation/primary prevention of drug use and availability of treatment for abuse.   Diet: Encouraged to adjust caloric intake to maintain  or achieve ideal body weight, to reduce intake of dietary saturated fat and total fat, to limit sodium intake by avoiding high sodium foods and not adding table salt, and to maintain adequate dietary potassium and calcium preferably from fresh fruits, vegetables, and low-fat dairy products.    stressed the importance of regular exercise  Injury prevention: Discussed safety belts, safety helmets, smoke detector, smoking near bedding or upholstery.   Dental health: Discussed importance of regular tooth brushing, flossing, and dental visits.    NEXT  PREVENTATIVE PHYSICAL DUE IN 1 YEAR. Return in about 1 year (around 09/08/2018) for Physical.

## 2017-09-08 NOTE — Patient Instructions (Addendum)
[place] (Health Maintenance, Female) Un estilo de vida saludable y los cuidados preventivos pueden favorecer considerablemente a la salud y Musician. Pregunte a su mdico cul es el cronograma de exmenes peridicos apropiado para usted. Esta es una buena oportunidad para consultarlo sobre cmo prevenir enfermedades y [place] sano. Adems de los controles, hay muchas otras cosas que puede hacer usted mismo. Los expertos han realizado numerosas investigaciones [employer] cambios en el estilo de vida y las medidas de prevencin que, [place], lo ayudarn a mantenerse sano. Solicite a su mdico ms informacin. EL PESO Y LA DIETA Consuma una dieta saludable.  Asegrese de [employer] verduras, frutas, productos lcteos de bajo contenido de [place] y Advertising account planner.  No consuma muchos alimentos de alto contenido de grasas slidas, azcares agregados o sal.  Realice actividad fsica con regularidad. Esta es una de las prcticas ms importantes que puede hacer por su salud. ? La Delorise Shiner de los adultos deben hacer ejercicio durante al menos 124mnutos por semana. El ejercicio debe aumentar la frecuencia cardaca y pActorla transpiracin (ejercicio de i[place]. ? La mayora de los adultos tambin deben hacer ejercicios de elongacin al mToysRusveces a la semana. Agregue esto al su plan de ejercicio de intensidad moderada. Mantenga un peso saludable.  El ndice de masa corporal ([facility] es una medida que puede utilizarse para identificar posibles problemas de pEast Uniontown Proporciona una estimacin de la grasa corporal basndose en el peso y la altura. Su mdico puede ayudarle a dRadiation protection practitionerISouth Endy a lScientist, forensico mTheatre managerun peso saludable.  Para las mujeres de 20aos o ms: ? Un IJohn R. Oishei Children'S Hospitalmenor de 18,5 se considera bajo peso. ? Un ICumberland County Hospitalentre 18,5 y 24,9 es normal. ? Un IPelham Medical Centerentre 25 y 29,9 se considera sobrepeso. ? Un IMC de 30 o ms se considera  obesidad. Observe los niveles de colesterol y lpidos en la sangre.  Debe comenzar a rEnglish as a second language teacherde lpidos y cResearch officer, trade unionen la sangre a los 20aos y luego repetirlos cada 516aos  Es posible que nAutomotive engineerlos niveles de colesterol con mayor frecuencia si: ? Sus niveles de lpidos y colesterol son altos. ? Es mayor de 527CWC ? Presenta un alto riesgo de padecer enfermedades cardacas. DETECCIN DE CNCER Cncer de pulmn  Se recomienda realizar exmenes de deteccin de cncer de pulmn a personas adultas entre 574y 892aos que estn en riesgo de dHorticulturist, commercialde pulmn por sus antecedentes de consumo de tabaco.  Se recomienda una tomografa computarizada de baja dosis de los pulmones todos los aos a las personas que: ? Fuman actualmente. ? Hayan dejado el hbito en algn momento en los ltimos 15aos. ? Hayan fumado durante 30aos un paquete diario. Un paquete-ao equivale a fumar un promedio de un paquete de cigarrillos diario durante un ao.  Los exmenes de deteccin anuales deben continuar hasta que hayan pasado 15aos desde que dej de fumar.  Ya no debern realizarse si tiene un problema de salud que le impida recibir tratamiento para eScience writerde pulmn. Cncer de mama  Practique la autoconciencia de la mama. Esto significa reconocer la apariencia normal de sus mamas y cmo las siente.  Tambin significa realizar autoexmenes regulares de lJohnson & Jamarques Pinedo Informe a su mdico sobre cualquier cambio, sin importar cun pequeo sea.  Si tiene entre 20 y 363aos, un mdico debe realizarle un examen clnico de las mamas como parte del examen regular de s[place] cada 1 a  3aos.  Si tiene 40aos o ms, debe realizarse un examen clnico de las mamas todos los aos. Tambin considere realizarse una radiografa de las mamas (mamografa) todos los aos.  Si tiene antecedentes familiares de cncer de mama, hable con su mdico para someterse a un estudio gentico.  Si  tiene alto riesgo de padecer cncer de mama, hable con su mdico para someterse a una resonancia magntica y una mamografa todos los aos.  La evaluacin del gen del cncer de mama (BRCA) se recomienda a mujeres que tengan familiares con cnceres relacionados con el BRCA. Los cnceres relacionados con el BRCA incluyen los siguientes: ? Mama. ? Ovario. ? Trompas. ? Cnceres de peritoneo.  Los resultados de la evaluacin determinarn la necesidad de asesoramiento gentico y de anlisis de BRCA1 y BRCA2. Cncer de cuello del tero El mdico puede recomendarle que se haga pruebas peridicas de deteccin de cncer de los rganos de la pelvis (ovarios, tero y vagina). Estas pruebas incluyen un examen plvico, que abarca controlar si se produjeron cambios microscpicos en la superficie del cuello del tero (prueba de Papanicolaou). Pueden recomendarle que se haga estas pruebas cada 3aos, a partir de los 21aos.  A las mujeres que tienen entre 30 y 65aos, los mdicos pueden recomendarles que se sometan a exmenes plvicos y pruebas de Papanicolaou cada 3aos, o a la prueba de Papanicolaou y el examen plvico en combinacin con estudios de deteccin del virus del papiloma humano (VPH) cada 5aos. Algunos tipos de VPH aumentan el riesgo de padecer cncer de cuello del tero. La prueba para la deteccin del VPH tambin puede realizarse a mujeres de cualquier edad cuyos resultados de la prueba de Papanicolaou no sean claros.  Es posible que otros mdicos no recomienden exmenes de deteccin a mujeres no embarazadas que se consideran sujetos de bajo riesgo de padecer cncer de pelvis y que no tienen sntomas. Pregntele al mdico si un examen plvico de deteccin es adecuado para usted.  Si ha recibido un tratamiento para el cncer cervical o una enfermedad que podra causar cncer, necesitar realizarse una prueba de Papanicolaou y controles durante al menos 20 aos de concluido el tratamiento. Si no se  ha hecho el Papanicolaou con regularidad, debern volver a evaluarse los factores de riesgo (como tener un nuevo compaero sexual), para determinar si debe realizarse los estudios nuevamente. Algunas mujeres sufren problemas mdicos que aumentan la probabilidad de contraer cncer de cuello del tero. En estos casos, el mdico podr indicar que se realicen controles y pruebas de Papanicolaou con ms frecuencia. Cncer colorrectal  Este tipo de cncer puede detectarse y a menudo prevenirse.  Por lo general, los estudios de rutina se deben comenzar a hacer a partir de los 50 aos y hasta los 75 aos.  Sin embargo, el mdico podr aconsejarle que lo haga antes, si tiene factores de riesgo para el cncer de colon.  Tambin puede recomendarle que use un kit de prueba para hallar sangre oculta en la materia fecal.  Es posible que se use una pequea cmara en el extremo de un tubo para examinar directamente el colon (sigmoidoscopia o colonoscopia) a fin de detectar formas tempranas de cncer colorrectal.  Los exmenes de rutina generalmente comienzan a los 50aos.  El examen directo del colon se debe repetir cada 5 a 10aos hasta los 75aos. Sin embargo, es posible que se realicen exmenes con mayor frecuencia, si se detectan formas tempranas de plipos precancerosos o pequeos bultos. Cncer de piel  Revise la piel   de la cabeza a los pies con regularidad.  Informe a su mdico si aparecen nuevos lunares o los que tiene se modifican, especialmente en su forma y color.  Tambin notifique al mdico si tiene un lunar que es ms grande que el tamao de una goma de lpiz.  Siempre use pantalla solar. Aplique pantalla solar de manera libre y repetida a lo largo del da.  Protjase usando mangas y pantalones largos, un sombrero de ala ancha y gafas para el sol, siempre que se encuentre en el exterior. ENFERMEDADES CARDACAS, DIABETES E HIPERTENSIN ARTERIAL  La hipertensin arterial causa  enfermedades cardacas y aumenta el riesgo de ictus. La hipertensin arterial es ms probable en los siguientes casos: ? Las personas que tienen la presin arterial en el extremo del rango normal (100-139/85-89 mm Hg). ? Las personas con sobrepeso u obesidad. ? Las personas afroamericanas.  Si usted tiene entre 18 y 39 aos, debe medirse la presin arterial cada 3 a 5 aos. Si usted tiene 40 aos o ms, debe medirse la presin arterial todos los aos. Debe medirse la presin arterial dos veces: una vez cuando est en un hospital o una clnica y la otra vez cuando est en otro sitio. Registre el promedio de las dos mediciones. Para controlar su presin arterial cuando no est en un hospital o una clnica, puede usar lo siguiente: ? Una mquina automtica para medir la presin arterial en una farmacia. ? Un monitor para medir la presin arterial en el hogar.  Si tiene entre 55 y 79 aos, consulte a su mdico si debe tomar aspirina para prevenir el ictus.  Realcese exmenes de deteccin de la diabetes con regularidad. Esto incluye la toma de una muestra de sangre para controlar el nivel de azcar en la sangre durante el ayuno. ? Si tiene un peso normal y un bajo riesgo de padecer diabetes, realcese este anlisis cada tres aos despus de los 45aos. ? Si tiene sobrepeso y un alto riesgo de padecer diabetes, considere someterse a este anlisis antes o con mayor frecuencia. PREVENCIN DE INFECCIONES HepatitisB  Si tiene un riesgo ms alto de contraer hepatitis B, debe someterse a un examen de deteccin de este virus. Se considera que tiene un alto riesgo de contraer hepatitis B si: ? Naci en un pas donde la hepatitis B es frecuente. Pregntele a su mdico qu pases son considerados de alto riesgo. ? Sus padres nacieron en un pas de alto riesgo y usted no recibi una vacuna que lo proteja contra la hepatitis B (vacuna contra la hepatitis B). ? Tiene VIH o sida. ? Usa agujas para inyectarse  drogas. ? Vive con alguien que tiene hepatitis B. ? Ha tenido sexo con alguien que tiene hepatitis B. ? Recibe tratamiento de hemodilisis. ? Toma ciertos medicamentos para el cncer, trasplante de rganos y afecciones autoinmunitarias. Hepatitis C  Se recomienda un anlisis de sangre para: ? Todos los que nacieron entre 1945 y 1965. ? Todas las personas que tengan un riesgo de haber contrado hepatitis C. Enfermedades de transmisin sexual (ETS).  Debe realizarse pruebas de deteccin de enfermedades de transmisin sexual (ETS), incluidas gonorrea y clamidia si: ? Es sexualmente activo y es menor de 24aos. ? Es mayor de 24aos, y el mdico le informa que corre riesgo de tener este tipo de infecciones. ? La actividad sexual ha cambiado desde que le hicieron la ltima prueba de deteccin y tiene un riesgo mayor de tener clamidia o gonorrea. Pregntele al mdico si usted   tiene riesgo.  Si no tiene el VIH, pero corre riesgo de infectarse por el virus, se recomienda tomar diariamente un medicamento recetado para evitar la infeccin. Esto se conoce como profilaxis previa a la exposicin. Se considera que est en riesgo si: ? Es [place] sexualmente y no [place] preservativos habitualmente o no conoce el estado del VIH de sus Advertising copywriter. ? Se inyecta drogas. ? Es [place] sexualmente con Ardelia Mems pareja que tiene VIH. Consulte a su mdico para saber si tiene un alto riesgo de infectarse por el VIH. Si opta por comenzar la profilaxis previa a la exposicin, primero debe realizarse anlisis de deteccin del VIH. Luego, le harn anlisis cada 79mses mientras est tomando los medicamentos para la profilaxis previa a la exposicin. E[facility] Si es premenopusica y puede quedar eAsharoken solicite a su mdico asesoramiento previo a la concepcin.  Si puede quedar embarazada, tome 400 a [id number](mcg) de cido fAnheuser-Busch  Si desea evitar el embarazo, hable con su mdico sobre el  control de la natalidad (anticoncepcin). OSTEOPOROSIS Y MENOPAUSIA  La osteoporosis es una enfermedad en la que los huesos pierden los minerales y la fuerza por el avance de la edad. El resultado pueden ser fracturas graves en los hGrand View El riesgo de osteoporosis puede identificarse con uArdelia Memsprueba de densidad sea.  Si tiene 65aos o ms, o si est en riesgo de sufrir osteoporosis y fracturas, pregunte a su mdico si debe someterse a exmenes.  Consulte a su mdico si debe tomar un suplemento de calcio o de vitamina D para reducir el riesgo de osteoporosis.  La menopausia puede presentar ciertos sntomas fsicos y rGaffer  La terapia de reemplazo hormonal puede reducir algunos de estos sntomas y rGaffer Consulte a su mdico para saber si la terapia de reemplazo hormonal es conveniente para usted. INSTRUCCIONES PARA EL CUIDADO EN EL HOGAR  Realcese los estudios de rutina de la salud, dentales y de lPublic librarian  MAda  No consuma ningn producto que contenga tabaco, lo que incluye cigarrillos, tabaco de mHigher education careers advisero cPsychologist, sport and exercise  Si est embarazada, no beba alcohol.  Si est amamantando, reduzca el consumo de alcohol y la frecuencia con la que consume.  Si es mujer y no est embarazada limite el consumo de alcohol a no ms de 1 medida por da. Una medida equivale a 12onzas de cerveza, 5onzas de vino o 1onzas de bebidas alcohlicas de alta graduacin.  No consuma drogas.  No comparta agujas.  Solicite ayuda a su mdico si necesita apoyo o informacin para abandonar las drogas.  Informe a su mdico si a menudo se siente deprimido.  Notifique a su mdico si alguna vez ha sido vctima de abuso o si no se siente seguro en su hogar. Esta informacin no tiene cMarine scientistel consejo del mdico. Asegrese de hacerle al mdico cualquier pregunta que tenga. Document Released: 10/06/2011 Document Revised: 11/07/2014 Document Reviewed:  07/21/2015 Elsevier Interactive Patient Education  2018 [place] Una erupcin cutnea es un cambio en el color de la piel. Una erupcin tambin puede cambiar la forma en que se siente la piel. Hay muchas afecciones y fSUPERVALU INCque pueden causar una erupcin. CUIDADOS EN EL HOGAR Est atento a cualquier cambio en los sntomas. Estas indicaciones pueden ayudarlo con el trastorno: MNorthrop Grummano aSmurfit-Stone Containermedicamentos de venta libre y los recetados solamente como se lo haya indicado el mdico. Estos pueden incluir lo  siguiente:  Crema con corticoides.  Lociones para Barrister's clerk.  Antihistamnicos por va oral. Cuidado de la piel  Coloque compresas fras en las zonas afectadas.  Trate de tomar un bao con lo siguiente: ? Sales de [place]. Siga las instrucciones del envase. Puede conseguirlas en la tienda de comestibles o la farmacia local. ? Bicarbonato de sodio. Vierta un poco en la baera como se lo haya indicado el mdico. ? Avena coloidal. Siga las instrucciones del envase. Puede conseguirla en la tienda de comestibles o la farmacia local.  Intente colocarse una pasta de bicarbonato de sodio sobre la piel. Agregue agua al bicarbonato de sodio hasta que se forme una pasta.  No se rasque ni se refriegue la piel.  Evite cubrir la erupcin. Asegrese de que la erupcin est expuesta al aire todo lo posible. Instrucciones generales  Evite los baos de inmersin y las duchas calientes ya que pueden empeorar la picazn. Ardelia Mems ducha fra puede Runner, broadcasting/film/video.  Evite los detergentes y los jabones perfumados, y los perfumes. Utilice jabones, detergentes, perfumes y cosmticos suaves.  Evite todo lo que le cause erupcin cutnea. Lleve un diario como ayuda para registrar lo que le causa erupcin. Escriba los siguientes datos: ? Lo que come. ? Los cosmticos que [place]. ? Lo que bebe. ? La ropa que [place]. [place]  alhajas.  Concurra a todas las visitas de control como se lo haya indicado el mdico. Esto es importante. SOLICITE AYUDA SI:  Transpira de noche.  Pierde peso.  Orina ms de lo normal.  Se siente dbil.  Vomita.  Tiene un color amarillo en la piel o en la zona blanca del ojo (ictericia).  La piel: ? Siente hormigueos. ? Se adormece.  La erupcin cutnea: ? No desaparece despus de [employer]. ? Empeora.  Usted: ? Est ms sediento que lo habitual. ? Est ms cansado que lo habitual.  Tiene los siguientes sntomas: ? Sntomas nuevos. ? Dolor en el vientre (abdomen). ? Fiebre. ? Deposiciones lquidas (diarrea). SOLICITE AYUDA DE INMEDIATO SI:  La erupcin cubre todo el cuerpo o la mayor parte de [place]. La erupcin puede ser dolorosa o no.  Tiene ampollas con las siguientes caractersticas: ? Se ubican sobre la erupcin. ? Se agrandan. ? Crecen juntas. ? Son dolorosas. ? Estn dentro de la nariz o la boca.  Tiene una erupcin cutnea con estas caractersticas: ? Tiene pequeas manchas moradas, como si fueran pinchazos, en todo el cuerpo. ? Tiene un aspecto parecido a Earna Coder o a un blanco de tiro. ? Est enrojecida y le duele, le produce descamacin de la piel y no se relaciona con haber estado mucho tiempo bajo el sol. Esta informacin no tiene Marine scientist el consejo del mdico. Asegrese de hacerle al mdico cualquier pregunta que tenga. Document Released: 01/13/2009 Document Revised: 02/08/2016 Document Reviewed: 03/04/2015 Elsevier Interactive Patient Education  2018 [place] bacteriana (Bacterial Vaginosis) La vaginosis bacteriana es una infeccin vaginal que perturba el equilibrio normal de las bacterias que se encuentran en la vagina. Es el resultado de un crecimiento excesivo de ciertas bacterias. Esta es la infeccin vaginal ms frecuente en mujeres en edad reproductiva. El tratamiento es importante para prevenir complicaciones,  especialmente en mujeres embarazadas, dado que puede causar un parto prematuro. CAUSAS La vaginosis bacteriana se origina por un aumento de bacterias nocivas que, generalmente, estn presentes en cantidades ms pequeas en la vagina. Varios tipos diferentes de bacterias pueden causar esta afeccin. Sin  embargo, la causa de su desarrollo no se comprende totalmente. Karnes o comportamientos pueden exponerlo a un mayor riesgo de desarrollar vaginosis bacteriana, entre los que se incluyen:  Tener una nueva pareja sexual o mltiples parejas sexuales.  Las duchas vaginales  El uso del DIU (dispositivo intrauterino) como mtodo anticonceptivo. El contagio no se produce en baos, por ropas de cama, en piscinas o por contacto con objetos. SIGNOS Y SNTOMAS Algunas mujeres que padecen vaginosis bacteriana no presentan signos ni sntomas. Los sntomas ms comunes son:  Secrecin vaginal de color grisceo.  Secrecin vaginal con olor similar al [employer], especialmente despus de Retail banker.  Picazn o sensacin de ardor en la vagina o la vulva.  Ardor o dolor al [employer]. DIAGNSTICO Su mdico analizar su historia clnica y le examinar la vagina para detectar signos de vaginosis bacteriana. Puede tomarle Truddie Coco de flujo vaginal. Su mdico examinar esta muestra con un microscopio para controlar las bacterias y clulas anormales. Tambin puede realizarse un anlisis del pH vaginal. TRATAMIENTO La vaginosis bacteriana puede tratarse con antibiticos, en forma de comprimidos o de crema vaginal. Puede indicarse una segunda tanda de antibiticos si la afeccin se repite despus del tratamiento. Debido a que la vaginosis bacteriana aumenta el riesgo de contraer enfermedades de transmisin sexual, el tratamiento puede ayudar a reducir el riesgo de clamidia, [place], VIH y herpes. [place] solo medicamentos de venta  libre o recetados, segn las indicaciones del mdico.  Si le han recetado antibiticos, tmelos como se le indic. Asegrese de que finaliza la prescripcin completa aunque se sienta mejor.  Comunique a sus compaeros sexuales que sufre una infeccin vaginal. Deben consultar a su mdico y recibir tratamiento si tienen problemas, como picazn o una erupcin cutnea leve.  Durante el [place], es importante que siga estas indicaciones: ? Teacher, music o use preservativos de Cabin crew. ? No se haga duchas vaginales. ? Evite consumir alcohol como se lo haya indicado el mdico. ? Evite amamantar como se lo haya indicado el mdico.  SOLICITE ATENCIN MDICA SI:  Sus sntomas no mejoran despus de 3 das de Anthony.  Aumenta la secrecin o Conservation officer, historic buildings.  Tiene fiebre.  ASEGRESE DE QUE:  Comprende estas instrucciones.  Controlar su afeccin.  Recibir ayuda de inmediato si no mejora o si empeora.  PARA OBTENER MS INFORMACIN Centros para el control y la prevencin de Probation officer for Disease Control and Prevention, CDC): AppraiserFraud.fi Asociacin Estadounidense de la Salud Sexual (American Sexual Health Association, SHA): www.ashastd.org Esta informacin no tiene Marine scientist el consejo del mdico. Asegrese de hacerle al mdico cualquier pregunta que tenga. Document Released: 01/24/2008 Document Revised: 11/07/2014 Document Reviewed: 05/29/2013 Elsevier Interactive Patient Education  2017 [employer].

## 2017-09-09 LAB — LIPID PANEL W/O CHOL/HDL RATIO
Cholesterol, Total: 213 mg/dL — ABNORMAL HIGH (ref 100–199)
HDL: 36 mg/dL — ABNORMAL LOW (ref >39)
LDL Calculated: 150 mg/dL — ABNORMAL HIGH (ref 0–99)
Triglycerides: 134 mg/dL (ref 0–149)
VLDL Cholesterol Cal: 27 mg/dL (ref 5–40)

## 2017-09-09 LAB — COMPREHENSIVE METABOLIC PANEL
A/G RATIO: 1.6 (ref 1.2–2.2)
ALBUMIN: 4.5 g/dL (ref 3.5–5.5)
ALT: 19 IU/L (ref 0–32)
AST: 16 IU/L (ref 0–40)
Alkaline Phosphatase: 52 IU/L (ref 39–117)
BUN / CREAT RATIO: 12 (ref 9–23)
BUN: 8 mg/dL (ref 6–20)
Bilirubin Total: <0.2 mg/dL (ref 0.0–1.2)
CALCIUM: 9.5 mg/dL (ref 8.7–10.2)
CO2: 25 mmol/L (ref 20–29)
CREATININE: 0.68 mg/dL (ref 0.57–1.00)
Chloride: 103 mmol/L (ref 96–106)
GFR, EST AFRICAN AMERICAN: 136 mL/min/{1.73_m2} (ref >59)
GFR, EST NON AFRICAN AMERICAN: 118 mL/min/{1.73_m2} (ref >59)
GLOBULIN, TOTAL: 2.8 g/dL (ref 1.5–4.5)
Glucose: 85 mg/dL (ref 65–99)
POTASSIUM: 4.1 mmol/L (ref 3.5–5.2)
SODIUM: 140 mmol/L (ref 134–144)
TOTAL PROTEIN: 7.3 g/dL (ref 6.0–8.5)

## 2017-09-09 LAB — CBC WITH DIFFERENTIAL/PLATELET
BASOS: 0 %
Basophils Absolute: 0 10*3/uL (ref 0.0–0.2)
EOS (ABSOLUTE): 0.1 10*3/uL (ref 0.0–0.4)
EOS: 1 %
HEMATOCRIT: 41.8 % (ref 34.0–46.6)
HEMOGLOBIN: 14 g/dL (ref 11.1–15.9)
IMMATURE GRANULOCYTES: 0 %
Immature Grans (Abs): 0 10*3/uL (ref 0.0–0.1)
Lymphocytes Absolute: 4.2 10*3/uL — ABNORMAL HIGH (ref 0.7–3.1)
Lymphs: 45 %
MCH: 28.2 pg (ref 26.6–33.0)
MCHC: 33.5 g/dL (ref 31.5–35.7)
MCV: 84 fL (ref 79–97)
MONOCYTES: 5 %
MONOS ABS: 0.5 10*3/uL (ref 0.1–0.9)
NEUTROS PCT: 49 %
Neutrophils Absolute: 4.5 10*3/uL (ref 1.4–7.0)
Platelets: 327 10*3/uL (ref 150–379)
RBC: 4.96 x10E6/uL (ref 3.77–5.28)
RDW: 13.9 % (ref 12.3–15.4)
WBC: 9.4 10*3/uL (ref 3.4–10.8)

## 2017-09-09 LAB — TSH: TSH: 2.6 u[IU]/mL (ref 0.450–4.500)

## 2017-09-10 LAB — UA/M W/RFLX CULTURE, ROUTINE
Bilirubin, UA: NEGATIVE
GLUCOSE, UA: NEGATIVE
Ketones, UA: NEGATIVE
NITRITE UA: NEGATIVE
PROTEIN UA: NEGATIVE
Urobilinogen, Ur: 0.2 mg/dL (ref 0.2–1.0)
pH, UA: 5.5 (ref 5.0–7.5)

## 2017-09-10 LAB — MICROSCOPIC EXAMINATION

## 2017-09-10 LAB — URINE CULTURE, REFLEX

## 2017-09-11 ENCOUNTER — Telehealth: Payer: Self-pay | Admitting: Family Medicine

## 2017-09-11 NOTE — Telephone Encounter (Signed)
Please let her know through the language line that her labs came back normal except her cholesterol is a little bit high, but not high enough to need medicine right now. Thanks!

## 2017-09-11 NOTE — Telephone Encounter (Signed)
Detailed  message left on voicemail through the language line.

## 2017-09-14 ENCOUNTER — Encounter: Payer: Managed Care, Other (non HMO) | Admitting: Obstetrics and Gynecology

## 2018-05-11 ENCOUNTER — Encounter: Payer: Managed Care, Other (non HMO) | Admitting: Obstetrics and Gynecology

## 2018-05-16 ENCOUNTER — Encounter: Payer: Self-pay | Admitting: Unknown Physician Specialty

## 2018-05-16 ENCOUNTER — Ambulatory Visit (INDEPENDENT_AMBULATORY_CARE_PROVIDER_SITE_OTHER): Payer: 59 | Admitting: Unknown Physician Specialty

## 2018-05-16 ENCOUNTER — Other Ambulatory Visit: Payer: Self-pay

## 2018-05-16 VITALS — BP 111/77 | HR 76 | Temp 98.7°F | Ht 63.4 in | Wt 223.0 lb

## 2018-05-16 DIAGNOSIS — R51 Headache: Secondary | ICD-10-CM | POA: Diagnosis not present

## 2018-05-16 DIAGNOSIS — J069 Acute upper respiratory infection, unspecified: Secondary | ICD-10-CM

## 2018-05-16 DIAGNOSIS — R519 Headache, unspecified: Secondary | ICD-10-CM | POA: Insufficient documentation

## 2018-05-16 DIAGNOSIS — G8929 Other chronic pain: Secondary | ICD-10-CM

## 2018-05-16 MED ORDER — GUAIFENESIN-CODEINE 100-10 MG/5ML PO SOLN: 10.0000 mL | Freq: Three times a day (TID) | ORAL | 0 refills | Status: DC | PRN

## 2018-05-16 MED ORDER — PREDNISONE 20 MG PO TABS: 20.0000 mg | ORAL_TABLET | Freq: Every day | ORAL | 0 refills | Status: AC

## 2018-05-16 NOTE — Assessment & Plan Note (Signed)
Ongoing persistant headaches.  No acute exacerbation today.  Will set up appt with primary for evaluation and treatment

## 2018-05-16 NOTE — Progress Notes (Signed)
BP 111/77   Pulse 76   Temp 98.7 F (37.1 C) (Oral)   Ht 5' 3.4" (1.61 m)   Wt 223 lb (101.2 kg)   SpO2 99%   BMI 39.01 kg/m    Subjective:    Patient ID: Andrea Ellis, female    DOB: 1985-11-22, 32 y.o.   MRN: [id number]  HPI: Andrea Ellis is a 32 y.o. female  Chief Complaint  Patient presents with  . Sore Throat    x 3 days. pt states first started about a month ago but after a week it went away by itself  . Cough   URI   This is a new problem. Episode onset: Yesterday.  Similar symptoms 1 month ago with resolution after about 8 days. The problem has been unchanged. There has been no fever. Associated symptoms include congestion, coughing, joint pain, nausea, rhinorrhea, sinus pain, a sore throat and swollen glands. Pertinent negatives include no abdominal pain, chest pain, diarrhea, dysuria, ear pain, headaches, rash, vomiting or wheezing. Associated symptoms comments: Muscle aches. She has tried nothing for the symptoms.   Headaches Pt is complaining of headaches that are ongoing for a number of years.  They come on suddenly and sometimes turn into migraines.  No acute exacerbation at this time  Relevant past medical, surgical, family and social history reviewed and updated as indicated. Interim medical history since our last visit reviewed. Allergies and medications reviewed and updated.  Review of Systems  HENT: Positive for congestion, rhinorrhea, sinus pain and sore throat. Negative for ear pain.   Respiratory: Positive for cough. Negative for wheezing.   Cardiovascular: Negative for chest pain.  Gastrointestinal: Positive for nausea. Negative for abdominal pain, diarrhea and vomiting.  Genitourinary: Negative for dysuria.  Musculoskeletal: Positive for joint pain.  Skin: Negative for rash.  Neurological: Negative for headaches.    Per HPI unless specifically indicated above     Objective:    BP 111/77   Pulse 76   Temp 98.7 F (37.1 C) (Oral)    Ht 5' 3.4" (1.61 m)   Wt 223 lb (101.2 kg)   SpO2 99%   BMI 39.01 kg/m   Wt Readings from Last 3 Encounters:  05/16/18 223 lb (101.2 kg)  09/08/17 218 lb 8 oz (99.1 kg)  02/02/17 209 lb (94.8 kg)    Physical Exam  Constitutional: She is oriented to person, place, and time. She appears well-developed and well-nourished. No distress.  HENT:  Head: Normocephalic and atraumatic.  Right Ear: Tympanic membrane and ear canal normal.  Left Ear: Tympanic membrane and ear canal normal.  Nose: Rhinorrhea present. Right sinus exhibits no maxillary sinus tenderness and no frontal sinus tenderness. Left sinus exhibits no maxillary sinus tenderness and no frontal sinus tenderness.  Mouth/Throat: Mucous membranes are normal. Posterior oropharyngeal erythema present.  Eyes: Conjunctivae and lids are normal. Right eye exhibits no discharge. Left eye exhibits no discharge. No scleral icterus.  Cardiovascular: Normal rate and regular rhythm.  Pulmonary/Chest: Effort normal and breath sounds normal. No respiratory distress.  Abdominal: Normal appearance. There is no splenomegaly or hepatomegaly.  Musculoskeletal: Normal range of motion.  Neurological: She is alert and oriented to person, place, and time.  Skin: Skin is intact. No rash noted. No pallor.  Psychiatric: She has a normal mood and affect. Her behavior is normal. Judgment and thought content normal.    Results for orders placed or performed in visit on 09/08/17  WET PREP FOR TRICH, YEAST, CLUE  Result Value Ref Range   Trichomonas Exam Negative Negative   Yeast Exam Negative Negative   Clue Cell Exam Positive (A) Negative  Microscopic Examination  Result Value Ref Range   WBC, UA 11-30 (A) 0 - 5 /hpf   RBC, UA 0-2 0 - 2 /hpf   Epithelial Cells (non renal) >10 (A) 0 - 10 /hpf   Bacteria, UA Moderate (A) None seen/Few  Urine Culture, Reflex  Result Value Ref Range   Urine Culture, Routine Final report    Organism ID, Bacteria Comment     CBC with Differential/Platelet  Result Value Ref Range   WBC 9.4 3.4 - 10.8 x10E3/uL   RBC 4.96 3.77 - 5.28 x10E6/uL   Hemoglobin 14.0 11.1 - 15.9 g/dL   Hematocrit 16.1 09.6 - 46.6 %   MCV 84 79 - 97 fL   MCH 28.2 26.6 - 33.0 pg   MCHC 33.5 31.5 - 35.7 g/dL   RDW 04.5 40.9 - 81.1 %   Platelets 327 150 - 379 x10E3/uL   Neutrophils 49 Not Estab. %   Lymphs 45 Not Estab. %   Monocytes 5 Not Estab. %   Eos 1 Not Estab. %   Basos 0 Not Estab. %   Neutrophils Absolute 4.5 1.4 - 7.0 x10E3/uL   Lymphocytes Absolute 4.2 (H) 0.7 - 3.1 x10E3/uL   Monocytes Absolute 0.5 0.1 - 0.9 x10E3/uL   EOS (ABSOLUTE) 0.1 0.0 - 0.4 x10E3/uL   Basophils Absolute 0.0 0.0 - 0.2 x10E3/uL   Immature Granulocytes 0 Not Estab. %   Immature Grans (Abs) 0.0 0.0 - 0.1 x10E3/uL  Comprehensive metabolic panel  Result Value Ref Range   Glucose 85 65 - 99 mg/dL   BUN 8 6 - 20 mg/dL   Creatinine, Ser 9.14 0.57 - 1.00 mg/dL   GFR calc non Af Amer 118 >59 mL/min/1.73   GFR calc Af Amer 136 >59 mL/min/1.73   BUN/Creatinine Ratio 12 9 - 23   Sodium 140 134 - 144 mmol/L   Potassium 4.1 3.5 - 5.2 mmol/L   Chloride 103 96 - 106 mmol/L   CO2 25 20 - 29 mmol/L   Calcium 9.5 8.7 - 10.2 mg/dL   Total Protein 7.3 6.0 - 8.5 g/dL   Albumin 4.5 3.5 - 5.5 g/dL   Globulin, Total 2.8 1.5 - 4.5 g/dL   Albumin/Globulin Ratio 1.6 1.2 - 2.2   Bilirubin Total <0.2 0.0 - 1.2 mg/dL   Alkaline Phosphatase 52 39 - 117 IU/L   AST 16 0 - 40 IU/L   ALT 19 0 - 32 IU/L  Lipid Panel w/o Chol/HDL Ratio  Result Value Ref Range   Cholesterol, Total 213 (H) 100 - 199 mg/dL   Triglycerides 782 0 - 149 mg/dL   HDL 36 (L) >95 mg/dL   VLDL Cholesterol Cal 27 5 - 40 mg/dL   LDL Calculated 621 (H) 0 - 99 mg/dL  TSH  Result Value Ref Range   TSH 2.600 0.450 - 4.500 uIU/mL  UA/M w/rflx Culture, Routine  Result Value Ref Range   Specific Gravity, UA >1.030 (H) 1.005 - 1.030   pH, UA 5.5 5.0 - 7.5   Color, UA Yellow Yellow   Appearance Ur  Turbid (A) Clear   Leukocytes, UA 1+ (A) Negative   Protein, UA Negative Negative/Trace   Glucose, UA Negative Negative   Ketones, UA Negative Negative   RBC, UA Trace (A) Negative   Bilirubin, UA Negative Negative   Urobilinogen,  Ur 0.2 0.2 - 1.0 mg/dL   Nitrite, UA Negative Negative   Microscopic Examination See below:    Urinalysis Reflex Comment       Assessment & Plan:   Problem List Items Addressed This Visit      Unprioritized   Chronic headaches    Ongoing persistant headaches.  No acute exacerbation today.  Will set up appt with primary for evaluation and treatment       Other Visit Diagnoses    Viral upper respiratory tract infection    -  Primary   New problem.  Robitussin with codeine for cough.  Described symptoms consistent with viral infection.  Encouraged OTC pseudophed.  Prednisone 20 mg for 3 days       Follow up plan: Return for Needs appt with Dr. Laural BenesJohnson for headaches.

## 2018-05-16 NOTE — Patient Instructions (Addendum)
Infeccin respiratoria viral Viral Respiratory Infection Una infeccin respiratoria es una enfermedad que afecta parte del sistema respiratorio, como los pulmones, la nariz o la garganta. La mayora de las infecciones respiratorias son causadas por virus o bacterias. Una infeccin respiratoria causada por un virus se llama infeccin respiratoria viral. Los tipos frecuentes de infecciones respiratorias virales incluyen lo siguiente:  Un resfro.  La gripe (influenza).  Una infeccin por el virus respiratorio sincicial (VRS).  Cmo s si tengo una infeccin respiratoria viral? La mayora de las infecciones respiratorias virales causan lo siguiente:  Secrecin o congestin nasal.  Secrecin nasal amarilla o verde.  Tos.  Estornudos.  Fatiga.  Dolores musculares.  Dolor de Advertising copywritergarganta.  Sudoracin o escalofros.  Grant RutsFiebre.  Dolor de Turkmenistancabeza.  Cmo se tratan las infecciones respiratorias virales? Si se diagnostica gripe de manera temprana, se puede tratar con un medicamento antiviral para reducir el tiempo que una persona tiene sntomas. Los sntomas de las infecciones respiratorias virales se pueden tratar con medicamentos de venta libre y recetados, como por ejemplo:  Expectorantes. Estos medicamentos facilitan la expulsin de la mucosidad al toser.  Aerosol nasal descongestivo.  Los mdicos no recetan antibiticos para las infecciones virales. La explicacin es que los antibiticos estn diseados para matar bacterias. No tienen ningn Lehman Brothersefecto sobre los virus. Cmo s si debo quedarme en casa en lugar de ir al Aleen Campitrabajo o a la escuela? Para evitar exponer a Economistotras personas a su infeccin respiratoria viral, qudese en su casa si tiene los siguientes sntomas:  Fiebre.  Tos persistente.  Dolor de Advertising copywritergarganta.  Secrecin nasal.  Estornudos.  Dolores musculares.  Dolores de Turkmenistancabeza.  Fatiga.  Debilidad.  Escalofros.  Sudoracin.  Nuseas.  Siga estas indicaciones  en su casa:  Descanse todo lo que pueda.  Tome los medicamentos de venta libre y los recetados solamente como se lo haya indicado el mdico.  Beba suficiente lquido para Pharmacologistmantener la orina clara o de color amarillo plido. Esto ayuda a Agricultural engineerprevenir la deshidratacin y ayuda a Risk manageraflojar la mucosidad.  Haga grgaras con una mezcla de agua y sal 3 o 4veces al da, o cuando sea necesario. Para preparar la mezcla de agua con sal, disuelva totalmente de media a 1cucharadita de sal en 1taza de agua tibia.  Use gotas para la nariz elaboradas con agua salada para aliviar la congestin y Education administratorsuavizar la piel irritada alrededor de la Clinical cytogeneticistnariz.  No beba alcohol.  No consuma productos que contengan tabaco, incluidos cigarrillos, tabaco de Theatre managermascar y Administrator, Civil Servicecigarrillos electrnicos. Si necesita ayuda para dejar de fumar, consulte al mdico. Comunquese con un mdico si:  Los sntomas duran 10das o ms.  Los sntomas empeoran con [employer] Waste Industriesel tiempo.  Tiene fiebre.  Siente un dolor intenso en los senos paranasales en el rostro o en la frente.  Las glndulas en la mandbula o el cuello estn muy hinchadas. Solicite ayuda de inmediato si:  Electronics engineeriente dolor u opresin en el pecho.  Le falta el aire.  Se siente mareado o como si fuera a desmayarse.  Tiene vmitos intensos y persistentes.  Se siente desorientado o confundido. Esta informacin no tiene Theme park managercomo fin reemplazar el consejo del mdico. Asegrese de hacerle al mdico cualquier pregunta que tenga. Document Released: 07/27/2005 Document Revised: 01/25/2017 Document Reviewed: 03/25/2015 Elsevier Interactive Patient Education  2018 [employer] ---------------------------------------------------------------------------  Pseudophed

## 2018-09-11 ENCOUNTER — Other Ambulatory Visit: Payer: Self-pay

## 2018-09-11 ENCOUNTER — Encounter: Payer: Self-pay | Admitting: Family Medicine

## 2018-09-11 ENCOUNTER — Ambulatory Visit (INDEPENDENT_AMBULATORY_CARE_PROVIDER_SITE_OTHER): Payer: 59 | Admitting: Family Medicine

## 2018-09-11 VITALS — BP 125/80 | HR 79 | Temp 98.2°F | Ht 64.5 in | Wt 225.0 lb

## 2018-09-11 DIAGNOSIS — M5416 Radiculopathy, lumbar region: Secondary | ICD-10-CM

## 2018-09-11 DIAGNOSIS — N898 Other specified noninflammatory disorders of vagina: Secondary | ICD-10-CM | POA: Diagnosis not present

## 2018-09-11 DIAGNOSIS — N760 Acute vaginitis: Secondary | ICD-10-CM | POA: Diagnosis not present

## 2018-09-11 DIAGNOSIS — B9689 Other specified bacterial agents as the cause of diseases classified elsewhere: Secondary | ICD-10-CM

## 2018-09-11 DIAGNOSIS — Z0000 Encounter for general adult medical examination without abnormal findings: Secondary | ICD-10-CM | POA: Diagnosis not present

## 2018-09-11 DIAGNOSIS — R8281 Pyuria: Secondary | ICD-10-CM

## 2018-09-11 LAB — WET PREP FOR TRICH, YEAST, CLUE
Clue Cell Exam: POSITIVE — AB
Trichomonas Exam: NEGATIVE
Yeast Exam: NEGATIVE

## 2018-09-11 LAB — UA/M W/RFLX CULTURE, ROUTINE
Bilirubin, UA: NEGATIVE
GLUCOSE, UA: NEGATIVE
Ketones, UA: NEGATIVE
Nitrite, UA: NEGATIVE
PH UA: 7 (ref 5.0–7.5)
PROTEIN UA: NEGATIVE
Specific Gravity, UA: <1.005 — ABNORMAL LOW (ref 1.005–1.030)
Urobilinogen, Ur: 0.2 mg/dL (ref 0.2–1.0)

## 2018-09-11 LAB — MICROSCOPIC EXAMINATION

## 2018-09-11 MED ORDER — METRONIDAZOLE 500 MG PO TABS: 500.0000 mg | ORAL_TABLET | Freq: Two times a day (BID) | ORAL | 0 refills | Status: DC

## 2018-09-11 MED ORDER — NYSTATIN-TRIAMCINOLONE 100000-0.1 UNIT/GM-% EX OINT: 1.0000 "application " | TOPICAL_OINTMENT | Freq: Two times a day (BID) | CUTANEOUS | 6 refills | Status: DC

## 2018-09-11 NOTE — Assessment & Plan Note (Signed)
Advised patient to follow up with sports med as needed. Call with any concerns.

## 2018-09-11 NOTE — Progress Notes (Signed)
BP 125/80   Pulse 79   Temp 98.2 F (36.8 C) (Oral)   Ht 5' 4.5" (1.638 m)   Wt 225 lb (102.1 kg)   SpO2 99%   BMI 38.02 kg/m    Subjective:    Patient ID: Andrea Ellis, female    DOB: 1986/03/29, 32 y.o.   MRN: [id number]  HPI: Andrea Ellis is a 32 y.o. female presenting on 09/11/2018 for comprehensive medical examination. Current medical complaints include:  VAGINAL ITCHING- Has been having itching in her vaginal area.  Duration: months, coming and going Discharge description: none  Pruritus: yes Dysuria: yes Malodorous: no Urinary frequency: no Fevers: no Abdominal pain: no  Sexual activity: monogamous History of sexually transmitted diseases: no Recent antibiotic use: no Context: comes and goes, equal right now Treatments attempted: triamcinalone cream- helped a little  Continues with pain in her back- has not been back to see Dr. Katrinka Blazing, continues with the pain  Menopausal Symptoms: no  Depression Screen done today and results listed below:  Depression screen [facility] 2/9 09/11/2018 09/08/2017 05/05/2016 02/01/2016  Decreased Interest 0 0 0 0  Down, Depressed, Hopeless 0 1 0 0  PHQ - 2 Score 0 1 0 0  Altered sleeping 0 - - -  Tired, decreased energy 0 - - -  Change in appetite 0 - - -  Feeling bad or failure about yourself  0 - - -  Trouble concentrating 0 - - -  Moving slowly or fidgety/restless 0 - - -  Suicidal thoughts 0 - - -  PHQ-9 Score 0 - - -  Difficult doing work/chores Not difficult at all - - -    Past Medical History:  Past Medical History:  Diagnosis Date  . Headache    Surgical History:  Past Surgical History:  Procedure Laterality Date  . APPENDECTOMY    . KNEE SURGERY    . LAPAROSCOPIC APPENDECTOMY N/A 08/22/2016   Procedure: APPENDECTOMY LAPAROSCOPIC;  Surgeon: Henrene Dodge, MD;  Location: [facility] ORS;  Service: General;  Laterality: N/A;    Medications:  Current Outpatient Medications on File Prior to Visit  Medication Sig  .  levonorgestrel (MIRENA) 20 MCG/24HR IUD 1 Intra Uterine Device (1 each total) by Intrauterine route once.   No current facility-administered medications on file prior to visit.     Allergies:  No Known Allergies  Social History:  Social History   Socioeconomic History  . Marital status: Married    Spouse name: Not on file  . Number of children: Not on file  . Years of education: Not on file  . Highest education level: Not on file  Occupational History  . Occupation: manufactoring  Social Needs  . Financial resource strain: Not on file  . Food insecurity:    Worry: Not on file    Inability: Not on file  . Transportation needs:    Medical: Not on file    Non-medical: Not on file  Tobacco Use  . Smoking status: Never Smoker  . Smokeless tobacco: Never Used  Substance and Sexual Activity  . Alcohol use: No    Alcohol/week: 0.0 standard drinks  . Drug use: No  . Sexual activity: Yes    Partners: Male    Birth control/protection: IUD    Comment: mirena  Lifestyle  . Physical activity:    Days per week: Not on file    Minutes per session: Not on file  . Stress: Not on file  Relationships  . Social  connections:    Talks on phone: Not on file    Gets together: Not on file    Attends religious service: Not on file    Active member of club or organization: Not on file    Attends meetings of clubs or organizations: Not on file    Relationship status: Not on file  . Intimate partner violence:    Fear of current or ex partner: Not on file    Emotionally abused: Not on file    Physically abused: Not on file    Forced sexual activity: Not on file  Other Topics Concern  . Not on file  Social History Narrative  . Not on file   Social History   Tobacco Use  Smoking Status Never Smoker  Smokeless Tobacco Never Used   Social History   Substance and Sexual Activity  Alcohol Use No  . Alcohol/week: 0.0 standard drinks    Family History:  Family History  Problem  Relation Age of Onset  . Alcohol abuse Father     Past medical history, surgical history, medications, allergies, family history and social history reviewed with patient today and changes made to appropriate areas of the chart.   Review of Systems  Constitutional: Negative.   HENT: Negative.   Eyes: Positive for blurred vision (seeing eye doctor). Negative for double vision, photophobia, pain, discharge and redness.  Respiratory: Negative.   Cardiovascular: Negative.   Gastrointestinal: Positive for heartburn (with certain foods). Negative for abdominal pain, blood in stool, constipation, diarrhea, melena, nausea and vomiting.  Genitourinary: Negative.   Musculoskeletal: Positive for back pain and myalgias. Negative for falls, joint pain and neck pain.  Skin: Positive for itching. Negative for rash.  Neurological: Positive for dizziness and headaches. Negative for tingling, tremors, sensory change, speech change, focal weakness, seizures, loss of consciousness and weakness.  Endo/Heme/Allergies: Negative.   Psychiatric/Behavioral: Negative.     All other ROS negative except what is listed above and in the HPI.      Objective:    BP 125/80   Pulse 79   Temp 98.2 F (36.8 C) (Oral)   Ht 5' 4.5" (1.638 m)   Wt 225 lb (102.1 kg)   SpO2 99%   BMI 38.02 kg/m   Wt Readings from Last 3 Encounters:  09/11/18 225 lb (102.1 kg)  05/16/18 223 lb (101.2 kg)  09/08/17 218 lb 8 oz (99.1 kg)    Physical Exam  Constitutional: She is oriented to person, place, and time. She appears well-developed and well-nourished. No distress.  HENT:  Head: Normocephalic and atraumatic.  Right Ear: Hearing, tympanic membrane, external ear and ear canal normal.  Left Ear: Hearing, tympanic membrane, external ear and ear canal normal.  Nose: Nose normal.  Mouth/Throat: Uvula is midline, oropharynx is clear and moist and mucous membranes are normal. No oropharyngeal exudate.  Eyes: Pupils are equal,  round, and reactive to light. Conjunctivae, EOM and lids are normal. Right eye exhibits no discharge. Left eye exhibits no discharge. No scleral icterus.  Neck: Normal range of motion. Neck supple. No JVD present. No tracheal deviation present. No thyromegaly present.  Cardiovascular: Normal rate, regular rhythm, normal heart sounds and intact distal pulses. Exam reveals no gallop and no friction rub.  No murmur heard. Pulmonary/Chest: Effort normal and breath sounds normal. No stridor. No respiratory distress. She has no wheezes. She has no rales. She exhibits no tenderness. Right breast exhibits no inverted nipple, no mass, no nipple discharge, no skin  change and no tenderness. Left breast exhibits no inverted nipple, no mass, no nipple discharge, no skin change and no tenderness. No breast swelling, tenderness, discharge or bleeding. Breasts are symmetrical.  Abdominal: Soft. Bowel sounds are normal. She exhibits no distension and no mass. There is no tenderness. There is no rebound and no guarding. No hernia. Hernia confirmed negative in the right inguinal area and confirmed negative in the left inguinal area.  Genitourinary: Uterus normal. No breast swelling, tenderness, discharge or bleeding. No labial fusion. There is rash on the right labia. There is no tenderness, lesion or injury on the right labia. There is rash on the left labia. There is no tenderness, lesion or injury on the left labia. Cervix exhibits no motion tenderness, no discharge and no friability. Right adnexum displays no mass, no tenderness and no fullness. Left adnexum displays no mass, no tenderness and no fullness. No erythema, tenderness or bleeding in the vagina. No foreign body in the vagina. No signs of injury around the vagina. No vaginal discharge found.  Musculoskeletal: Normal range of motion. She exhibits no edema, tenderness or deformity.  Lymphadenopathy:    She has no cervical adenopathy.  Neurological: She is alert  and oriented to person, place, and time. She displays normal reflexes. No cranial nerve deficit or sensory deficit. She exhibits normal muscle tone. Coordination normal.  Skin: Skin is warm, dry and intact. Capillary refill takes less than 2 seconds. No rash noted. She is not diaphoretic. No erythema. No pallor.  Psychiatric: She has a normal mood and affect. Her speech is normal and behavior is normal. Judgment and thought content normal. Cognition and memory are normal.  Nursing note and vitals reviewed. Breast and Genital exam done today with Elton Sin, CMA in attendance.   Results for orders placed or performed in visit on 09/08/17  WET PREP FOR TRICH, YEAST, CLUE  Result Value Ref Range   Trichomonas Exam Negative Negative   Yeast Exam Negative Negative   Clue Cell Exam Positive (A) Negative  Microscopic Examination  Result Value Ref Range   WBC, UA 11-30 (A) 0 - 5 /hpf   RBC, UA 0-2 0 - 2 /hpf   Epithelial Cells (non renal) >10 (A) 0 - 10 /hpf   Bacteria, UA Moderate (A) None seen/Few  Urine Culture, Reflex  Result Value Ref Range   Urine Culture, Routine Final report    Organism ID, Bacteria Comment   CBC with Differential/Platelet  Result Value Ref Range   WBC 9.4 3.4 - 10.8 x10E3/uL   RBC 4.96 3.77 - 5.28 x10E6/uL   Hemoglobin 14.0 11.1 - 15.9 g/dL   Hematocrit 16.1 09.6 - 46.6 %   MCV 84 79 - 97 fL   MCH 28.2 26.6 - 33.0 pg   MCHC 33.5 31.5 - 35.7 g/dL   RDW 04.5 40.9 - 81.1 %   Platelets 327 150 - 379 x10E3/uL   Neutrophils 49 Not Estab. %   Lymphs 45 Not Estab. %   Monocytes 5 Not Estab. %   Eos 1 Not Estab. %   Basos 0 Not Estab. %   Neutrophils Absolute 4.5 1.4 - 7.0 x10E3/uL   Lymphocytes Absolute 4.2 (H) 0.7 - 3.1 x10E3/uL   Monocytes Absolute 0.5 0.1 - 0.9 x10E3/uL   EOS (ABSOLUTE) 0.1 0.0 - 0.4 x10E3/uL   Basophils Absolute 0.0 0.0 - 0.2 x10E3/uL   Immature Granulocytes 0 Not Estab. %   Immature Grans (Abs) 0.0 0.0 - 0.1 x10E3/uL  Comprehensive  metabolic panel  Result Value Ref Range   Glucose 85 65 - 99 mg/dL   BUN 8 6 - 20 mg/dL   Creatinine, Ser 5.620.68 0.57 - 1.00 mg/dL   GFR calc non Af Amer 118 >59 mL/min/1.73   GFR calc Af Amer 136 >59 mL/min/1.73   BUN/Creatinine Ratio 12 9 - 23   Sodium 140 134 - 144 mmol/L   Potassium 4.1 3.5 - 5.2 mmol/L   Chloride 103 96 - 106 mmol/L   CO2 25 20 - 29 mmol/L   Calcium 9.5 8.7 - 10.2 mg/dL   Total Protein 7.3 6.0 - 8.5 g/dL   Albumin 4.5 3.5 - 5.5 g/dL   Globulin, Total 2.8 1.5 - 4.5 g/dL   Albumin/Globulin Ratio 1.6 1.2 - 2.2   Bilirubin Total <0.2 0.0 - 1.2 mg/dL   Alkaline Phosphatase 52 39 - 117 IU/L   AST 16 0 - 40 IU/L   ALT 19 0 - 32 IU/L  Lipid Panel w/o Chol/HDL Ratio  Result Value Ref Range   Cholesterol, Total 213 (H) 100 - 199 mg/dL   Triglycerides 130134 0 - 149 mg/dL   HDL 36 (L) >86>39 mg/dL   VLDL Cholesterol Cal 27 5 - 40 mg/dL   LDL Calculated 578150 (H) 0 - 99 mg/dL  TSH  Result Value Ref Range   TSH 2.600 0.450 - 4.500 uIU/mL  UA/M w/rflx Culture, Routine  Result Value Ref Range   Specific Gravity, UA >1.030 (H) 1.005 - 1.030   pH, UA 5.5 5.0 - 7.5   Color, UA Yellow Yellow   Appearance Ur Turbid (A) Clear   Leukocytes, UA 1+ (A) Negative   Protein, UA Negative Negative/Trace   Glucose, UA Negative Negative   Ketones, UA Negative Negative   RBC, UA Trace (A) Negative   Bilirubin, UA Negative Negative   Urobilinogen, Ur 0.2 0.2 - 1.0 mg/dL   Nitrite, UA Negative Negative   Microscopic Examination See below:    Urinalysis Reflex Comment       Assessment & Plan:   Problem List Items Addressed This Visit      Nervous and Auditory   Lumbar radiculopathy    Advised patient to follow up with sports med as needed. Call with any concerns.        Other Visit Diagnoses    Routine general medical examination at a health care facility    -  Primary   Vaccines up to date. Screening labs checked today. Continue diet and exercise. Call with any concerns. Pap up  to date.    Relevant Orders   CBC with Differential/Platelet   Comprehensive metabolic panel   Lipid Panel w/o Chol/HDL Ratio   TSH   UA/M w/rflx Culture, Routine   Vaginal itching       Will treat labial issues with nystatin-trimacinalone and treat BV. Call with any concerns.    Relevant Orders   WET PREP FOR TRICH, YEAST, CLUE   Pyuria       Checking culture, await results.    Relevant Orders   Urine Culture   BV (bacterial vaginosis)       Will treat with metronidazole. Call with any concerns.    Relevant Medications   nystatin-triamcinolone ointment (MYCOLOG)   metroNIDAZOLE (FLAGYL) 500 MG tablet       Follow up plan: Return in about 1 year (around 09/12/2019) for Physical.   LABORATORY TESTING:  - Pap smear: up to date  IMMUNIZATIONS:   -  Tdap: Tetanus vaccination status reviewed: last tetanus booster within 10 years. - Influenza: Refused - Pneumovax: Not applicable  PATIENT COUNSELING:   Advised to take 1 mg of folate supplement per day if capable of pregnancy.   Sexuality: Discussed sexually transmitted diseases, partner selection, use of condoms, avoidance of unintended pregnancy  and contraceptive alternatives.   Advised to avoid cigarette smoking.  I discussed with the patient that most people either abstain from alcohol or drink within safe limits (<=14/week and <=4 drinks/occasion for males, <=7/weeks and <= 3 drinks/occasion for females) and that the risk for alcohol disorders and other health effects rises proportionally with the number of drinks per week and how often a drinker exceeds daily limits.  Discussed cessation/primary prevention of drug use and availability of treatment for abuse.   Diet: Encouraged to adjust caloric intake to maintain  or achieve ideal body weight, to reduce intake of dietary saturated fat and total fat, to limit sodium intake by avoiding high sodium foods and not adding table salt, and to maintain adequate dietary potassium  and calcium preferably from fresh fruits, vegetables, and low-fat dairy products.    stressed the importance of regular exercise  Injury prevention: Discussed safety belts, safety helmets, smoke detector, smoking near bedding or upholstery.   Dental health: Discussed importance of regular tooth brushing, flossing, and dental visits.    NEXT PREVENTATIVE PHYSICAL DUE IN 1 YEAR. Return in about 1 year (around 09/12/2019) for Physical.

## 2018-09-11 NOTE — Patient Instructions (Addendum)
[place] (Health Maintenance, Female) Un estilo de vida saludable y los cuidados preventivos pueden favorecer considerablemente a la salud y Musician. Pregunte a su mdico cul es el cronograma de exmenes peridicos apropiado para usted. Esta es una buena oportunidad para consultarlo sobre cmo prevenir enfermedades y [place] sano. Adems de los controles, hay muchas otras cosas que puede hacer usted mismo. Los expertos han realizado numerosas investigaciones [employer] cambios en el estilo de vida y las medidas de prevencin que, [place], lo ayudarn a mantenerse sano. Solicite a su mdico ms informacin. EL PESO Y LA DIETA Consuma una dieta saludable.  Asegrese de [employer] verduras, frutas, productos lcteos de bajo contenido de [place] y Advertising account planner.  No consuma muchos alimentos de alto contenido de grasas slidas, azcares agregados o sal.  Realice actividad fsica con regularidad. Esta es una de las prcticas ms importantes que puede hacer por su salud. ? La Delorise Shiner de los adultos deben hacer ejercicio durante al menos 124mnutos por semana. El ejercicio debe aumentar la frecuencia cardaca y pActorla transpiracin (ejercicio de [place]. ? La mayora de los adultos tambin deben hacer ejercicios de elongacin al mToysRusveces a la semana. Agregue esto al su plan de ejercicio de intensidad moderada. Mantenga un peso saludable.  El ndice de masa corporal ([facility] es una medida que puede utilizarse para identificar posibles problemas de pEast [place]town Proporciona una estimacin de la grasa corporal basndose en el peso y la altura. Su mdico puede ayudarle a dRadiation protection practitionerISouth Endy a lScientist, forensico mTheatre managerun peso saludable.  Para las mujeres de 20aos o ms: ? Un IJohn R. Oishei Children'S Hospitalmenor de 18,5 se considera bajo peso. ? Un ICumberland County Hospitalentre 18,5 y 24,9 es normal. ? Un IPelham Medical Centerentre 25 y 29,9 se considera sobrepeso. ? Un IMC de 30 o ms se considera  obesidad. Observe los niveles de colesterol y lpidos en la sangre.  Debe comenzar a rEnglish as a second language teacherde lpidos y cResearch officer, trade unionen la sangre a los 20aos y luego repetirlos cada 516aos  Es posible que nAutomotive engineerlos niveles de colesterol con mayor frecuencia si: ? Sus niveles de lpidos y colesterol son altos. ? Es mayor de 527CWC ? Presenta un alto riesgo de padecer enfermedades cardacas. DETECCIN DE CNCER Cncer de pulmn  Se recomienda realizar exmenes de deteccin de cncer de pulmn a personas adultas entre 574y 892aos que estn en riesgo de dHorticulturist, commercialde pulmn por sus antecedentes de consumo de tabaco.  Se recomienda una tomografa computarizada de baja dosis de los pulmones todos los aos a las personas que: ? Fuman actualmente. ? Hayan dejado el hbito en algn momento en los ltimos 15aos. ? Hayan fumado durante 30aos un paquete diario. Un paquete-ao equivale a fumar un promedio de un paquete de cigarrillos diario durante un ao.  Los exmenes de deteccin anuales deben continuar hasta que hayan pasado 15aos desde que dej de fumar.  Ya no debern realizarse si tiene un problema de salud que le impida recibir tratamiento para eScience writerde pulmn. Cncer de mama  Practique la autoconciencia de la mama. Esto significa reconocer la apariencia normal de sus mamas y cmo las siente.  Tambin significa realizar autoexmenes regulares de lJohnson & Johnson Informe a su mdico sobre cualquier cambio, sin importar cun pequeo sea.  Si tiene entre 20 y 363aos, un mdico debe realizarle un examen clnico de las mamas como parte del examen regular de s[place] cada 1 a  3aos.  Si tiene 40aos o ms, debe realizarse un examen clnico de las mamas todos los aos. Tambin considere realizarse una radiografa de las mamas (mamografa) todos los aos.  Si tiene antecedentes familiares de cncer de mama, hable con su mdico para someterse a un estudio gentico.  Si  tiene alto riesgo de padecer cncer de mama, hable con su mdico para someterse a una resonancia magntica y una mamografa todos los aos.  La evaluacin del gen del cncer de mama (BRCA) se recomienda a mujeres que tengan familiares con cnceres relacionados con el BRCA. Los cnceres relacionados con el BRCA incluyen los siguientes: ? Mama. ? Ovario. ? Trompas. ? Cnceres de peritoneo.  Los resultados de la evaluacin determinarn la necesidad de asesoramiento gentico y de anlisis de BRCA1 y BRCA2. Cncer de cuello del tero El mdico puede recomendarle que se haga pruebas peridicas de deteccin de cncer de los rganos de la pelvis (ovarios, tero y vagina). Estas pruebas incluyen un examen plvico, que abarca controlar si se produjeron cambios microscpicos en la superficie del cuello del tero (prueba de Papanicolaou). Pueden recomendarle que se haga estas pruebas cada 3aos, a partir de los 21aos.  A las mujeres que tienen entre 30 y 65aos, los mdicos pueden recomendarles que se sometan a exmenes plvicos y pruebas de Papanicolaou cada 3aos, o a la prueba de Papanicolaou y el examen plvico en combinacin con estudios de deteccin del virus del papiloma humano (VPH) cada 5aos. Algunos tipos de VPH aumentan el riesgo de padecer cncer de cuello del tero. La prueba para la deteccin del VPH tambin puede realizarse a mujeres de cualquier edad cuyos resultados de la prueba de Papanicolaou no sean claros.  Es posible que otros mdicos no recomienden exmenes de deteccin a mujeres no embarazadas que se consideran sujetos de bajo riesgo de padecer cncer de pelvis y que no tienen sntomas. Pregntele al mdico si un examen plvico de deteccin es adecuado para usted.  Si ha recibido un tratamiento para el cncer cervical o una enfermedad que podra causar cncer, necesitar realizarse una prueba de Papanicolaou y controles durante al menos 20 aos de concluido el tratamiento. Si no se  ha hecho el Papanicolaou con regularidad, debern volver a evaluarse los factores de riesgo (como tener un nuevo compaero sexual), para determinar si debe realizarse los estudios nuevamente. Algunas mujeres sufren problemas mdicos que aumentan la probabilidad de contraer cncer de cuello del tero. En estos casos, el mdico podr indicar que se realicen controles y pruebas de Papanicolaou con ms frecuencia. Cncer colorrectal  Este tipo de cncer puede detectarse y a menudo prevenirse.  Por lo general, los estudios de rutina se deben comenzar a hacer a partir de los 50 aos y hasta los 75 aos.  Sin embargo, el mdico podr aconsejarle que lo haga antes, si tiene factores de riesgo para el cncer de colon.  Tambin puede recomendarle que use un kit de prueba para hallar sangre oculta en la materia fecal.  Es posible que se use una pequea cmara en el extremo de un tubo para examinar directamente el colon (sigmoidoscopia o colonoscopia) a fin de detectar formas tempranas de cncer colorrectal.  Los exmenes de rutina generalmente comienzan a los 50aos.  El examen directo del colon se debe repetir cada 5 a 10aos hasta los 75aos. Sin embargo, es posible que se realicen exmenes con mayor frecuencia, si se detectan formas tempranas de plipos precancerosos o pequeos bultos. Cncer de piel  Revise la piel   de la cabeza a los pies con regularidad.  Informe a su mdico si aparecen nuevos lunares o los que tiene se modifican, especialmente en su forma y color.  Tambin notifique al mdico si tiene un lunar que es ms grande que el tamao de una goma de lpiz.  Siempre use pantalla solar. Aplique pantalla solar de manera libre y repetida a lo largo del da.  Protjase usando mangas y pantalones largos, un sombrero de ala ancha y gafas para el sol, siempre que se encuentre en el exterior. ENFERMEDADES CARDACAS, DIABETES E HIPERTENSIN ARTERIAL  La hipertensin arterial causa  enfermedades cardacas y aumenta el riesgo de ictus. La hipertensin arterial es ms probable en los siguientes casos: ? Las personas que tienen la presin arterial en el extremo del rango normal (100-139/85-89 mm Hg). ? Las personas con sobrepeso u obesidad. ? Las personas afroamericanas.  Si usted tiene entre 18 y 39 aos, debe medirse la presin arterial cada 3 a 5 aos. Si usted tiene 40 aos o ms, debe medirse la presin arterial todos los aos. Debe medirse la presin arterial dos veces: una vez cuando est en un hospital o una clnica y la otra vez cuando est en otro sitio. Registre el promedio de las dos mediciones. Para controlar su presin arterial cuando no est en un hospital o una clnica, puede usar lo siguiente: ? Una mquina automtica para medir la presin arterial en una farmacia. ? Un monitor para medir la presin arterial en el hogar.  Si tiene entre 55 y 79 aos, consulte a su mdico si debe tomar aspirina para prevenir el ictus.  Realcese exmenes de deteccin de la diabetes con regularidad. Esto incluye la toma de una muestra de sangre para controlar el nivel de azcar en la sangre durante el ayuno. ? Si tiene un peso normal y un bajo riesgo de padecer diabetes, realcese este anlisis cada tres aos despus de los 45aos. ? Si tiene sobrepeso y un alto riesgo de padecer diabetes, considere someterse a este anlisis antes o con mayor frecuencia. PREVENCIN DE INFECCIONES HepatitisB  Si tiene un riesgo ms alto de contraer hepatitis B, debe someterse a un examen de deteccin de este virus. Se considera que tiene un alto riesgo de contraer hepatitis B si: ? Naci en un pas donde la hepatitis B es frecuente. Pregntele a su mdico qu pases son considerados de alto riesgo. ? Sus padres nacieron en un pas de alto riesgo y usted no recibi una vacuna que lo proteja contra la hepatitis B (vacuna contra la hepatitis B). ? Tiene VIH o sida. ? Usa agujas para inyectarse  drogas. ? Vive con alguien que tiene hepatitis B. ? Ha tenido sexo con alguien que tiene hepatitis B. ? Recibe tratamiento de hemodilisis. ? Toma ciertos medicamentos para el cncer, trasplante de rganos y afecciones autoinmunitarias. Hepatitis C  Se recomienda un anlisis de sangre para: ? Todos los que nacieron entre 1945 y 1965. ? Todas las personas que tengan un riesgo de haber contrado hepatitis C. Enfermedades de transmisin sexual (ETS).  Debe realizarse pruebas de deteccin de enfermedades de transmisin sexual (ETS), incluidas gonorrea y clamidia si: ? Es sexualmente activo y es menor de 24aos. ? Es mayor de 24aos, y el mdico le informa que corre riesgo de tener este tipo de infecciones. ? La actividad sexual ha cambiado desde que le hicieron la ltima prueba de deteccin y tiene un riesgo mayor de tener clamidia o gonorrea. Pregntele al mdico si usted   tiene riesgo.  Si no tiene el VIH, pero corre riesgo de infectarse por el virus, se recomienda tomar diariamente un medicamento recetado para evitar la infeccin. Esto se conoce como profilaxis previa a la exposicin. Se considera que est en riesgo si: ? Es [place] sexualmente y no [place] preservativos habitualmente o no conoce el estado del VIH de sus Advertising copywriter. ? Se inyecta drogas. ? Es [place] sexualmente con Ardelia Mems pareja que tiene VIH. Consulte a su mdico para saber si tiene un alto riesgo de infectarse por el VIH. Si opta por comenzar la profilaxis previa a la exposicin, primero debe realizarse anlisis de deteccin del VIH. Luego, le harn anlisis cada 15mses mientras est tomando los medicamentos para la profilaxis previa a la exposicin. [facility] Si es premenopusica y puede quedar e[place]sbo solicite a su mdico asesoramiento previo a la concepcin.  Si puede quedar embarazada, tome 400 a [id number](mcg) de cido fAnheuser-Busch  Si desea evitar el embarazo, hable con su mdico sobre el  control de la natalidad (anticoncepcin). OSTEOPOROSIS Y MENOPAUSIA  La osteoporosis es una enfermedad en la que los huesos pierden los minerales y la fuerza por el avance de la edad. El resultado pueden ser fracturas graves en los hCambridge El riesgo de osteoporosis puede identificarse con uArdelia Memsprueba de densidad sea.  Si tiene 65aos o ms, o si est en riesgo de sufrir osteoporosis y fracturas, pregunte a su mdico si debe someterse a exmenes.  Consulte a su mdico si debe tomar un suplemento de calcio o de vitamina D para reducir el riesgo de osteoporosis.  La menopausia puede presentar ciertos sntomas fsicos y rGaffer  La terapia de reemplazo hormonal puede reducir algunos de estos sntomas y rGaffer Consulte a su mdico para saber si la terapia de reemplazo hormonal es conveniente para usted. INSTRUCCIONES PARA EL CUIDADO EN EL HOGAR  Realcese los estudios de rutina de la salud, dentales y de lPublic librarian  [place]  No consuma ningn producto que contenga tabaco, lo que incluye cigarrillos, tabaco de mHigher education careers advisero cPsychologist, sport and exercise  Si est embarazada, no beba alcohol.  Si est amamantando, reduzca el consumo de alcohol y la frecuencia con la que consume.  Si es mujer y no est embarazada limite el consumo de alcohol a no ms de 1 medida por da. Una medida equivale a 12onzas de cerveza, 5onzas de vino o 1onzas de bebidas alcohlicas de alta graduacin.  No consuma drogas.  No comparta agujas.  Solicite ayuda a su mdico si necesita apoyo o informacin para abandonar las drogas.  Informe a su mdico si a menudo se siente deprimido.  Notifique a su mdico si alguna vez ha sido vctima de abuso o si no se siente seguro en su hogar. Esta informacin no tiene cMarine scientistel consejo del mdico. Asegrese de hacerle al mdico cualquier pregunta que tenga. Document Released: 10/06/2011 Document Revised: 11/07/2014 Document Reviewed:  07/21/2015 Elsevier Interactive Patient Education  2018 EOtoebacteriana Bacterial Vaginosis La vaginosis bacteriana es una infeccin vaginal que perturba el equilibrio normal de las bacterias que se encuentran en la vagina. Es el resultado de un crecimiento excesivo de ciertas bacterias. Es la infeccin vaginal ms frecuente en mujeres de [id number] Debido a que la vaginosis bacteriana aumenta el riesgo de contraerETS(enfermedades de transmisin sexual), el tratamiento puede ayudar a reducir el riesgo de contraer clamidia, gonorrea, herpes y VIH(virus de inmunodeficiencia humana). Adems, el tratamiento es importante  para prevenir complicaciones en embarazadas, ya que esta afeccin puede provocar un parto antes de tiempo(prematuro). Cules son las causas? Esta afeccin se origina por un aumento de bacterias nocivas que, generalmente, estn presentes en cantidades pequeas en la vagina. Sin embargo, la causa de su desarrollo no se comprende totalmente. Qu incrementa el riesgo? Los siguientes factores pueden hacer que usted sea ms propenso a tener esta enfermedad:  Tener una nueva pareja sexual o mltiples parejas sexuales.  Mantener relaciones sexuales sin proteccin.  Las [employer].  Tener colocado un dispositivo intrauterino(DIU).  Fumar.  Consumo excesivo de drogas y alcohol.  Tomar ciertos antibiticos.  Estar embarazada.  El contagio no se produce en baos, por ropas de cama, en piscinas ni por contacto con objetos. Cules son los signos o los sntomas? Los sntomas de esta afeccin incluyen los siguientes:  Secrecin vaginal de color gris o blanco. La secrecin tambin puede ser acuosa o espumosa.  Secrecin vaginal con olor similar al [employer]; en especial, despus de Armed forces operational officer sexuales o durante los perodos [place].  Picazn en la vagina y alrededor de esta.  Ardor o dolor al [employer].  Algunas mujeres que  padecen vaginosis bacteriana no presentan signos ni sntomas. Cmo se diagnostica? Esta afeccin se diagnostica en funcin de lo siguiente:  Sus antecedentes mdicos.  Examen fsico de la vagina.  Anlisis de [place] de fluido vaginal con un microscopio para determinar si hay una gran cantidad de bacterias nocivas o de clulas anormales. El mdico podra Risk manager un hisopo de algodn o una pequea esptula de madera para Energy manager [place].  Cmo se trata? Esta afeccin se trata con antibiticos. Podran administrarse en forma de pastillas, de una crema vaginal o de un medicamento que se coloca dentro de la vagina(vulo vaginal). Si la afeccin se repite despus del tratamiento, podra indicarse una segunda tanda de antibiticos. Siga estas indicaciones en su casa: Medicamentos  [employer] de venta libre y los recetados solamente como se lo haya indicado el [place] o utilice los antibiticos como se lo haya indicado el mdico. No deje de tomar ni de usar los antibiticos aunque comience a Sports administrator. Instrucciones generales  Si tiene una pareja sexual mujer, avsele que sufre una infeccin vaginal. Festus Holts debera consultar con su mdico y recibir tratamiento si tuviera sntomas. Si tiene una pareja sexual hombre, l no necesita tratamiento.  Durante el tratamiento: ? Evite la actividad sexual hasta que haya finalizado el [place]. ? No se haga duchas vaginales. ? Evite consumir alcohol como se lo haya indicado el mdico. ? Evite amamantar como se lo haya indicado el mdico.  Beba gran cantidad de lquido para mantener la orina de tono claro o color amarillo plido.  Mantenga limpia la zona que rodea la vagina y Designer, television/film set. ? Lave la zona diariamente con agua tibia. ? Cuando vaya al bao, siempre higiencese desde adelante hacia atrs.  Concurra a todas las visitas de control como se lo haya indicado el mdico. Esto es importante. Cmo se evita?  No  se haga duchas vaginales.  Higiencese la parte exterior de la vagina solo con agua tibia.  Utilice proteccin cuando [employer]. Por ejemplo, preservativos de ltex y protectores bucales.  Limite la cantidad de parejas sexuales. Para prevenir la vaginosis bacteriana, es mejor [employer] sexuales solo con una pareja(monogamia).  Es importante que usted y su pareja sexual se realicen estudios de Programme researcher, broadcasting/film/video de ETS.  Use ropa interior de algodn o con  revestimiento de algodn.  No use pantalones ni pantis ajustados; en especial, durante el verano.  Limite la cantidad de alcohol que bebe.  No consuma ningn producto que contenga nicotina o tabaco, como cigarrillos y Psychologist, sport and exercise. Si necesita ayuda para dejar de fumar, consulte al mdico.  No consuma drogas. Dnde encontrar ms informacin:  Centros para el control y la prevencin de enfermedades(Centers for Disease Control and Prevention):www.cdc.gov/std  Asociacin Estadounidense de la Salud Sexual (American Sexual Health Association, ASHA): www.ashastd.[place] y Servicios Humanos de los Estados Unidos, [place] de Salud de la Mujer(U.S. Department of Health and [employer], Office on [employer]): DustingSprays.pl o SecuritiesCard.it Comunquese con un mdico si:  Los sntomas no mejoran, ni siquiera despus del tratamiento.  Tiene ms secrecin o siente dolor al Continental Airlines.  Tiene fiebre.  Siente dolor en el abdomen.  Siente dolor durante las [employer].  Tiene sangrado vaginal entre los periodos [employer]. Resumen  La vaginosis bacteriana es una infeccin vaginal que perturba el equilibrio normal de las bacterias que se encuentran en la vagina.  Debido a que la vaginosis bacteriana aumenta el riesgo de contraerETS(enfermedades de transmisin sexual), el tratamiento puede ayudar a reducir el riesgo de  contraer clamidia, gonorrea, herpes y VIH(virus de inmunodeficiencia humana). Adems, el tratamiento es importante para prevenir complicaciones en embarazadas, ya que esta afeccin puede provocar un parto antes de tiempo(prematuro).  Esta afeccin se trata con antibiticos. Podran administrarse en forma de pastillas, de una crema vaginal o de un medicamento que se coloca dentro de la vagina(vulo vaginal). Esta informacin no tiene Marine scientist el consejo del mdico. Asegrese de hacerle al mdico cualquier pregunta que tenga. Document Released: 01/24/2008 Document Revised: 02/22/2017 Document Reviewed: 05/29/2013 Elsevier Interactive Patient Education  [employer].

## 2018-09-12 LAB — COMPREHENSIVE METABOLIC PANEL
A/G RATIO: 1.6 (ref 1.2–2.2)
ALK PHOS: 62 IU/L (ref 39–117)
ALT: 17 IU/L (ref 0–32)
AST: 16 IU/L (ref 0–40)
Albumin: 4.7 g/dL (ref 3.5–5.5)
BILIRUBIN TOTAL: 0.2 mg/dL (ref 0.0–1.2)
BUN / CREAT RATIO: 13 (ref 9–23)
BUN: 9 mg/dL (ref 6–20)
CHLORIDE: 105 mmol/L (ref 96–106)
CO2: 26 mmol/L (ref 20–29)
Calcium: 9.7 mg/dL (ref 8.7–10.2)
Creatinine, Ser: 0.71 mg/dL (ref 0.57–1.00)
GFR calc non Af Amer: 114 mL/min/{1.73_m2} (ref >59)
GFR, EST AFRICAN AMERICAN: 131 mL/min/{1.73_m2} (ref >59)
Globulin, Total: 3 g/dL (ref 1.5–4.5)
Glucose: 87 mg/dL (ref 65–99)
POTASSIUM: 4.6 mmol/L (ref 3.5–5.2)
Sodium: 139 mmol/L (ref 134–144)
Total Protein: 7.7 g/dL (ref 6.0–8.5)

## 2018-09-12 LAB — CBC WITH DIFFERENTIAL/PLATELET
BASOS ABS: 0 10*3/uL (ref 0.0–0.2)
BASOS: 0 %
EOS (ABSOLUTE): 0.1 10*3/uL (ref 0.0–0.4)
Eos: 1 %
Hematocrit: 40.6 % (ref 34.0–46.6)
Hemoglobin: 13.6 g/dL (ref 11.1–15.9)
Immature Grans (Abs): 0 10*3/uL (ref 0.0–0.1)
Immature Granulocytes: 0 %
LYMPHS ABS: 3.5 10*3/uL — AB (ref 0.7–3.1)
Lymphs: 38 %
MCH: 28.6 pg (ref 26.6–33.0)
MCHC: 33.5 g/dL (ref 31.5–35.7)
MCV: 85 fL (ref 79–97)
MONOCYTES: 6 %
MONOS ABS: 0.5 10*3/uL (ref 0.1–0.9)
NEUTROS ABS: 5 10*3/uL (ref 1.4–7.0)
Neutrophils: 55 %
Platelets: 379 10*3/uL (ref 150–450)
RBC: 4.76 x10E6/uL (ref 3.77–5.28)
RDW: 13.1 % (ref 12.3–15.4)
WBC: 9.2 10*3/uL (ref 3.4–10.8)

## 2018-09-12 LAB — LIPID PANEL W/O CHOL/HDL RATIO
Cholesterol, Total: 199 mg/dL (ref 100–199)
HDL: 31 mg/dL — ABNORMAL LOW (ref >39)
LDL Calculated: 117 mg/dL — ABNORMAL HIGH (ref 0–99)
Triglycerides: 257 mg/dL — ABNORMAL HIGH (ref 0–149)
VLDL Cholesterol Cal: 51 mg/dL — ABNORMAL HIGH (ref 5–40)

## 2018-09-12 LAB — TSH: TSH: 3.04 u[IU]/mL (ref 0.450–4.500)

## 2018-09-13 ENCOUNTER — Telehealth: Payer: Self-pay | Admitting: Family Medicine

## 2018-09-13 LAB — URINE CULTURE

## 2018-09-13 NOTE — Telephone Encounter (Signed)
Patient notified

## 2018-09-13 NOTE — Telephone Encounter (Signed)
Please let her know through the language line that all her labs were normal. Thanks!

## 2018-11-19 ENCOUNTER — Telehealth: Payer: Self-pay

## 2018-11-19 NOTE — Telephone Encounter (Signed)
Letter sent.

## 2019-09-17 ENCOUNTER — Other Ambulatory Visit (HOSPITAL_COMMUNITY)
Admission: RE | Admit: 2019-09-17 | Discharge: 2019-09-17 | Disposition: A | Discharge status: Home / Self Care | Payer: Commercial Managed Care - PPO | Source: Ambulatory Visit | Attending: Family Medicine | Admitting: Family Medicine

## 2019-09-17 ENCOUNTER — Encounter: Payer: Self-pay | Admitting: Family Medicine

## 2019-09-17 ENCOUNTER — Other Ambulatory Visit: Payer: Self-pay

## 2019-09-17 ENCOUNTER — Ambulatory Visit (INDEPENDENT_AMBULATORY_CARE_PROVIDER_SITE_OTHER): Payer: Commercial Managed Care - PPO | Admitting: Family Medicine

## 2019-09-17 ENCOUNTER — Other Ambulatory Visit: Payer: Self-pay | Admitting: Family Medicine

## 2019-09-17 VITALS — BP 130/89 | HR 87 | Temp 98.8°F | Ht 63.62 in | Wt 227.2 lb

## 2019-09-17 DIAGNOSIS — Z124 Encounter for screening for malignant neoplasm of cervix: Secondary | ICD-10-CM | POA: Insufficient documentation

## 2019-09-17 DIAGNOSIS — Z0000 Encounter for general adult medical examination without abnormal findings: Secondary | ICD-10-CM | POA: Diagnosis not present

## 2019-09-17 DIAGNOSIS — Z3169 Encounter for other general counseling and advice on procreation: Secondary | ICD-10-CM

## 2019-09-17 NOTE — Patient Instructions (Signed)
Health Maintenance, Female Adopting a healthy lifestyle and getting preventive care are important in promoting health and wellness. Ask your health care provider about:  The right schedule for you to have regular tests and exams.  Things you can do on your own to prevent diseases and keep yourself healthy. What should I know about diet, weight, and exercise? Eat a healthy diet   Eat a diet that includes plenty of vegetables, fruits, low-fat dairy products, and lean protein.  Do not eat a lot of foods that are high in solid fats, added sugars, or sodium. Maintain a healthy weight Body mass index (BMI) is used to identify weight problems. It estimates body fat based on height and weight. Your health care provider can help determine your BMI and help you achieve or maintain a healthy weight. Get regular exercise Get regular exercise. This is one of the most important things you can do for your health. Most adults should:  Exercise for at least 150 minutes each week. The exercise should increase your heart rate and make you sweat (moderate-intensity exercise).  Do strengthening exercises at least twice a week. This is in addition to the moderate-intensity exercise.  Spend less time sitting. Even light physical activity can be beneficial. Watch cholesterol and blood lipids Have your blood tested for lipids and cholesterol at 32 years of age, then have this test every 5 years. Have your cholesterol levels checked more often if:  Your lipid or cholesterol levels are high.  You are older than 32 years of age.  You are at high risk for heart disease. What should I know about cancer screening? Depending on your health history and family history, you may need to have cancer screening at various ages. This may include screening for:  Breast cancer.  Cervical cancer.  Colorectal cancer.  Skin cancer.  Lung cancer. What should I know about heart disease, diabetes, and high blood  pressure? Blood pressure and heart disease  High blood pressure causes heart disease and increases the risk of stroke. This is more likely to develop in people who have high blood pressure readings, are of African descent, or are overweight.  Have your blood pressure checked: ? Every 3-5 years if you are 18-39 years of age. ? Every year if you are 40 years old or older. Diabetes Have regular diabetes screenings. This checks your fasting blood sugar level. Have the screening done:  Once every three years after age 40 if you are at a normal weight and have a low risk for diabetes.  More often and at a younger age if you are overweight or have a high risk for diabetes. What should I know about preventing infection? Hepatitis B If you have a higher risk for hepatitis B, you should be screened for this virus. Talk with your health care provider to find out if you are at risk for hepatitis B infection. Hepatitis C Testing is recommended for:  Everyone born from 1945 through 1965.  Anyone with known risk factors for hepatitis C. Sexually transmitted infections (STIs)  Get screened for STIs, including gonorrhea and chlamydia, if: ? You are sexually active and are younger than 32 years of age. ? You are older than 32 years of age and your health care provider tells you that you are at risk for this type of infection. ? Your sexual activity has changed since you were last screened, and you are at increased risk for chlamydia or gonorrhea. Ask your health care provider if   you are at risk.  Ask your health care provider about whether you are at high risk for HIV. Your health care provider may recommend a prescription medicine to help prevent HIV infection. If you choose to take medicine to prevent HIV, you should first get tested for HIV. You should then be tested every 3 months for as long as you are taking the medicine. Pregnancy  If you are about to stop having your period (premenopausal) and  you may become pregnant, seek counseling before you get pregnant.  Take 400 to 800 micrograms (mcg) of folic acid every day if you become pregnant.  Ask for birth control (contraception) if you want to prevent pregnancy. Osteoporosis and menopause Osteoporosis is a disease in which the bones lose minerals and strength with aging. This can result in bone fractures. If you are 80 years old or older, or if you are at risk for osteoporosis and fractures, ask your health care provider if you should:  Be screened for bone loss.  Take a calcium or vitamin D supplement to lower your risk of fractures.  Be given hormone replacement therapy (HRT) to treat symptoms of menopause. Follow these instructions at home: Lifestyle  Do not use any products that contain nicotine or tobacco, such as cigarettes, e-cigarettes, and chewing tobacco. If you need help quitting, ask your health care provider.  Do not use street drugs.  Do not share needles.  Ask your health care provider for help if you need support or information about quitting drugs. Alcohol use  Do not drink alcohol if: ? Your health care provider tells you not to drink. ? You are pregnant, may be pregnant, or are planning to become pregnant.  If you drink alcohol: ? Limit how much you use to 0-1 drink a day. ? Limit intake if you are breastfeeding.  Be aware of how much alcohol is in your drink. In the [place] one drink equals one 12 oz bottle of beer (355 mL), one 5 oz glass of wine (148 mL), or one 1 oz glass of hard liquor (44 mL). General instructions  Schedule regular health, dental, and eye exams.  Stay current with your vaccines.  Tell your health care provider if: ? You often feel depressed. ? You have ever been abused or do not feel safe at home. Summary  Adopting a healthy lifestyle and getting preventive care are important in promoting health and wellness.  Follow your health care provider's instructions about healthy  diet, exercising, and getting tested or screened for diseases.  Follow your health care provider's instructions on monitoring your cholesterol and blood pressure. This information is not intended to replace advice given to you by your health care provider. Make sure you discuss any questions you have with your health care provider. Document Released: 05/02/2011 Document Revised: 10/10/2018 Document Reviewed: 10/10/2018 Elsevier Patient Education  2020 [employer].  Preparing for Pregnancy If you are considering becoming pregnant, make an appointment to see your regular health care provider to learn how to prepare for a safe and healthy pregnancy (preconception care). During a preconception care visit, your health care provider will:  Do a complete physical exam, including a Pap test.  Take a complete medical history.  Give you information, answer your questions, and help you resolve problems. Preconception checklist Medical history  Tell your health care provider about any current or past medical conditions. Your pregnancy or your ability to become pregnant may be affected by chronic conditions, such as diabetes, chronic  hypertension, and thyroid problems.  Include your family's medical history as well as your partner's medical history.  Tell your health care provider about any history of STIs (sexually transmitted infections).These can affect your pregnancy. In some cases, they can be passed to your baby. Discuss any concerns that you have about STIs.  If indicated, discuss the benefits of genetic testing. This testing will show whether there are any genetic conditions that may be passed from you or your partner to your baby.  Tell your health care provider about: ? Any problems you have had with conception or pregnancy. ? Any medicines you take. These include vitamins, herbal supplements, and over-the-counter medicines. ? Your history of immunizations. Discuss any vaccinations that  you may need. Diet  Ask your health care provider what to include in a healthy diet that has a balance of nutrients. This is especially important when you are pregnant or preparing to become pregnant.  Ask your health care provider to help you reach a healthy weight before pregnancy. ? If you are overweight, you may be at higher risk for certain complications, such as high blood pressure, diabetes, and preterm birth. ? If you are underweight, you are more likely to have a baby who has a low birth weight. Lifestyle, work, and home  Let your health care provider know: ? About any lifestyle habits that you have, such as alcohol use, drug use, or smoking. ? About recreational activities that may put you at risk during pregnancy, such as downhill skiing and certain exercise programs. ? Tell your health care provider about any international travel, especially any travel to places with an active [place] virus outbreak. ? About harmful substances that you may be exposed to at work or at home. These include chemicals, pesticides, radiation, or even litter boxes. ? If you do not feel safe at home. Mental health  Tell your health care provider about: ? Any history of mental health conditions, including feelings of depression, sadness, or anxiety. ? Any medicines that you take for a mental health condition. These include herbs and supplements. Home instructions to prepare for pregnancy Lifestyle   Eat a balanced diet. This includes fresh fruits and vegetables, whole grains, lean meats, low-fat dairy products, healthy fats, and foods that are high in fiber. Ask to meet with a nutritionist or registered dietitian for assistance with meal planning and goals.  Get regular exercise. Try to be active for at least 30 minutes a day on most days of the week. Ask your health care provider which activities are safe during pregnancy.  Do not use any products that contain nicotine or tobacco, such as cigarettes and  e-cigarettes. If you need help quitting, ask your health care provider.  Do not drink alcohol.  Do not take illegal drugs.  Maintain a healthy weight. Ask your health care provider what weight range is right for you. General instructions  Keep an accurate record of your menstrual periods. This makes it easier for your health care provider to determine your baby's due date.  Begin taking prenatal vitamins and folic acid supplements daily as directed by your health care provider.  Manage any chronic conditions, such as high blood pressure and diabetes, as told by your health care provider. This is important. How do I know that I am pregnant? You may be pregnant if you have been sexually active and you miss your period. Symptoms of early pregnancy include:  Mild cramping.  Very light vaginal bleeding (spotting).  Feeling unusually  tired.  Nausea and vomiting (morning sickness). If you have any of these symptoms and you suspect that you might be pregnant, you can take a home pregnancy test. These tests check for a hormone in your urine (human chorionic gonadotropin, or hCG). A woman's body begins to make this hormone during early pregnancy. These tests are very accurate. Wait until at least the first day after you miss your period to take one. If the test shows that you are pregnant (you get a positive result), call your health care provider to make an appointment for prenatal care. What should I do if I become pregnant?      Make an appointment with your health care provider as soon as you suspect you are pregnant.  Do not use any products that contain nicotine, such as cigarettes, chewing tobacco, and e-cigarettes. If you need help quitting, ask your health care provider.  Do not drink alcoholic beverages. Alcohol is related to a number of birth defects.  Avoid toxic odors and chemicals.  You may continue to have sexual intercourse if it does not cause pain or other problems, such  as vaginal bleeding. This information is not intended to replace advice given to you by your health care provider. Make sure you discuss any questions you have with your health care provider. Document Released: 09/29/2008 Document Revised: 10/19/2017 Document Reviewed: 05/08/2016 Elsevier Patient Education  2020 [employer]

## 2019-09-17 NOTE — Progress Notes (Signed)
BP 130/89   Pulse 87   Temp 98.8 F (37.1 C)   Ht 5' 3.62" (1.616 m)   Wt 227 lb 4 oz (103.1 kg)   SpO2 99%   BMI 39.47 kg/m    Subjective:    Patient ID: Andrea Ellis, female    DOB: 07-07-1986, 33 y.o.   MRN: [id number]  HPI: Andrea Ellis is a 33 y.o. female presenting on 09/17/2019 for comprehensive medical examination. Current medical complaints include: would like to have her IUD removed so she can try to get pregnant  She currently lives with: husband and child Menopausal Symptoms: no  Depression Screen done today and results listed below:  Depression screen [facility] 2/9 09/17/2019 09/11/2018 09/11/2018 09/08/2017 05/05/2016  Decreased Interest 0 2 0 0 0  Down, Depressed, Hopeless 0 1 0 1 0  PHQ - 2 Score 0 3 0 1 0  Altered sleeping - 0 0 - -  Tired, decreased energy - 1 0 - -  Change in appetite - 0 0 - -  Feeling bad or failure about yourself  - 0 0 - -  Trouble concentrating - 0 0 - -  Moving slowly or fidgety/restless - 0 0 - -  Suicidal thoughts - 0 0 - -  PHQ-9 Score - 4 0 - -  Difficult doing work/chores - - Not difficult at all - -    Past Medical History:  Past Medical History:  Diagnosis Date  . Headache     Surgical History:  Past Surgical History:  Procedure Laterality Date  . APPENDECTOMY    . KNEE SURGERY    . LAPAROSCOPIC APPENDECTOMY N/A 08/22/2016   Procedure: APPENDECTOMY LAPAROSCOPIC;  Surgeon: Olean Ree, MD;  Location: [facility] ORS;  Service: General;  Laterality: N/A;    Medications:  Current Outpatient Medications on File Prior to Visit  Medication Sig  . levonorgestrel (MIRENA) 20 MCG/24HR IUD 1 Intra Uterine Device (1 each total) by Intrauterine route once.   No current facility-administered medications on file prior to visit.     Allergies:  No Known Allergies  Social History:  Social History   Socioeconomic History  . Marital status: Married    Spouse name: Not on file  . Number of children: Not on file  . Years of  education: Not on file  . Highest education level: Not on file  Occupational History  . Occupation: manufactoring  Social Needs  . Financial resource strain: Not on file  . Food insecurity    Worry: Not on file    Inability: Not on file  . Transportation needs    Medical: Not on file    Non-medical: Not on file  Tobacco Use  . Smoking status: Never Smoker  . Smokeless tobacco: Never Used  Substance and Sexual Activity  . Alcohol use: No    Alcohol/week: 0.0 standard drinks  . Drug use: No  . Sexual activity: Yes    Partners: Male    Birth control/protection: I.U.D.    Comment: mirena  Lifestyle  . Physical activity    Days per week: Not on file    Minutes per session: Not on file  . Stress: Not on file  Relationships  . Social Herbalist on phone: Not on file    Gets together: Not on file    Attends religious service: Not on file    Active member of club or organization: Not on file    Attends meetings of clubs  or organizations: Not on file    Relationship status: Not on file  . Intimate partner violence    Fear of current or ex partner: Not on file    Emotionally abused: Not on file    Physically abused: Not on file    Forced sexual activity: Not on file  Other Topics Concern  . Not on file  Social History Narrative  . Not on file   Social History   Tobacco Use  Smoking Status Never Smoker  Smokeless Tobacco Never Used   Social History   Substance and Sexual Activity  Alcohol Use No  . Alcohol/week: 0.0 standard drinks    Family History:  Family History  Problem Relation Age of Onset  . Alcohol abuse Father     Past medical history, surgical history, medications, allergies, family history and social history reviewed with patient today and changes made to appropriate areas of the chart.   Review of Systems  Constitutional: Negative.   HENT: Negative.   Eyes: Negative.   Respiratory: Negative.   Cardiovascular: Negative.    Gastrointestinal: Positive for heartburn (goes away with OTC medicine). Negative for abdominal pain, blood in stool, constipation, diarrhea, melena, nausea and vomiting.  Genitourinary: Negative.   Musculoskeletal: Positive for back pain and myalgias. Negative for falls, joint pain and neck pain.  Skin: Negative.   Neurological: Positive for headaches (doesn't eat regularly). Negative for dizziness, tingling, tremors, sensory change, speech change, focal weakness, seizures, loss of consciousness and weakness.  Endo/Heme/Allergies: Negative.   Psychiatric/Behavioral: Negative.     All other ROS negative except what is listed above and in the HPI.      Objective:    BP 130/89   Pulse 87   Temp 98.8 F (37.1 C)   Ht 5' 3.62" (1.616 m)   Wt 227 lb 4 oz (103.1 kg)   SpO2 99%   BMI 39.47 kg/m   Wt Readings from Last 3 Encounters:  09/17/19 227 lb 4 oz (103.1 kg)  09/11/18 225 lb (102.1 kg)  05/16/18 223 lb (101.2 kg)    Physical Exam Vitals signs and nursing note reviewed. Exam conducted with a chaperone present.  Constitutional:      General: She is not in acute distress.    Appearance: Normal appearance. She is not ill-appearing, toxic-appearing or diaphoretic.  HENT:     Head: Normocephalic and atraumatic.     Right Ear: Tympanic membrane, ear canal and external ear normal. There is no impacted cerumen.     Left Ear: Tympanic membrane, ear canal and external ear normal. There is no impacted cerumen.     Nose: Nose normal. No congestion or rhinorrhea.     Mouth/Throat:     Mouth: Mucous membranes are moist.     Pharynx: Oropharynx is clear. No oropharyngeal exudate or posterior oropharyngeal erythema.  Eyes:     General: No scleral icterus.       Right eye: No discharge.        Left eye: No discharge.     Extraocular Movements: Extraocular movements intact.     Conjunctiva/sclera: Conjunctivae normal.     Pupils: Pupils are equal, round, and reactive to light.  Neck:      Musculoskeletal: Normal range of motion and neck supple. No neck rigidity or muscular tenderness.     Vascular: No carotid bruit.  Cardiovascular:     Rate and Rhythm: Normal rate and regular rhythm.     Pulses: Normal pulses.  Heart sounds: No murmur. No friction rub. No gallop.   Pulmonary:     Effort: Pulmonary effort is normal. No respiratory distress.     Breath sounds: Normal breath sounds. No stridor. No wheezing, rhonchi or rales.  Chest:     Chest wall: No tenderness.     Breasts:        Right: Normal. No swelling, bleeding, inverted nipple, mass, nipple discharge, skin change or tenderness.        Left: Normal. No swelling, bleeding, inverted nipple, mass, nipple discharge, skin change or tenderness.  Abdominal:     General: Abdomen is flat. Bowel sounds are normal. There is no distension.     Palpations: Abdomen is soft. There is no mass.     Tenderness: There is no abdominal tenderness. There is no right CVA tenderness, left CVA tenderness, guarding or rebound.     Hernia: No hernia is present. There is no hernia in the left inguinal area or right inguinal area.  Genitourinary:    General: Normal vulva.     Labia:        Right: No rash, tenderness, lesion or injury.        Left: No rash, tenderness, lesion or injury.      Urethra: No prolapse, urethral pain, urethral swelling or urethral lesion.     Vagina: Normal.     Cervix: Cervical bleeding present. No cervical motion tenderness, discharge, friability, lesion, erythema or eversion.     Uterus: Normal.      Comments: IUD strings not visible Musculoskeletal:        General: No swelling, tenderness, deformity or signs of injury.     Right lower leg: No edema.     Left lower leg: No edema.  Lymphadenopathy:     Cervical: No cervical adenopathy.     Upper Body:     Right upper body: No supraclavicular, axillary or pectoral adenopathy.     Left upper body: No supraclavicular, axillary or pectoral adenopathy.      Lower Body: No right inguinal adenopathy. No left inguinal adenopathy.  Skin:    General: Skin is warm and dry.     Capillary Refill: Capillary refill takes less than 2 seconds.     Coloration: Skin is not jaundiced or pale.     Findings: No bruising, erythema, lesion or rash.  Neurological:     General: No focal deficit present.     Mental Status: She is alert and oriented to person, place, and time. Mental status is at baseline.     Cranial Nerves: No cranial nerve deficit.     Sensory: No sensory deficit.     Motor: No weakness.     Coordination: Coordination normal.     Gait: Gait normal.     Deep Tendon Reflexes: Reflexes normal.  Psychiatric:        Mood and Affect: Mood normal.        Behavior: Behavior normal.        Thought Content: Thought content normal.        Judgment: Judgment normal.     Results for orders placed or performed in visit on 09/11/18  WET PREP FOR [place], YEAST, CLUE   Specimen: Vaginal Fluid   VAGINAL FLUI  Result Value Ref Range   Trichomonas Exam Negative Negative   Yeast Exam Negative Negative   Clue Cell Exam Positive (A) Negative  Urine Culture   Specimen: Urine   URINE  Result Value Ref  Range   Urine Culture, Routine Final report    Organism ID, Bacteria Comment   Microscopic Examination   URINE  Result Value Ref Range   WBC, UA 6-10 (A) 0 - 5 /hpf   RBC, UA 0-2 0 - 2 /hpf   Epithelial Cells (non renal) 0-10 0 - 10 /hpf   Bacteria, UA Few None seen/Few  CBC with Differential/Platelet  Result Value Ref Range   WBC 9.2 3.4 - 10.8 x10E3/uL   RBC 4.76 3.77 - 5.28 x10E6/uL   Hemoglobin 13.6 11.1 - 15.9 g/dL   Hematocrit 40.6 34.0 - 46.6 %   MCV 85 79 - 97 fL   MCH 28.6 26.6 - 33.0 pg   MCHC 33.5 31.5 - 35.7 g/dL   RDW 13.1 12.3 - 15.4 %   Platelets 379 150 - 450 x10E3/uL   Neutrophils 55 Not Estab. %   Lymphs 38 Not Estab. %   Monocytes 6 Not Estab. %   Eos 1 Not Estab. %   Basos 0 Not Estab. %   Neutrophils Absolute 5.0 1.4 -  7.0 x10E3/uL   Lymphocytes Absolute 3.5 (H) 0.7 - 3.1 x10E3/uL   Monocytes Absolute 0.5 0.1 - 0.9 x10E3/uL   EOS (ABSOLUTE) 0.1 0.0 - 0.4 x10E3/uL   Basophils Absolute 0.0 0.0 - 0.2 x10E3/uL   Immature Granulocytes 0 Not Estab. %   Immature Grans (Abs) 0.0 0.0 - 0.1 x10E3/uL  Comprehensive metabolic panel  Result Value Ref Range   Glucose 87 65 - 99 mg/dL   BUN 9 6 - 20 mg/dL   Creatinine, Ser 0.71 0.57 - 1.00 mg/dL   GFR calc non Af Amer 114 >59 mL/min/1.73   GFR calc Af Amer 131 >59 mL/min/1.73   BUN/Creatinine Ratio 13 9 - 23   Sodium 139 134 - 144 mmol/L   Potassium 4.6 3.5 - 5.2 mmol/L   Chloride 105 96 - 106 mmol/L   CO2 26 20 - 29 mmol/L   Calcium 9.7 8.7 - 10.2 mg/dL   Total Protein 7.7 6.0 - 8.5 g/dL   Albumin 4.7 3.5 - 5.5 g/dL   Globulin, Total 3.0 1.5 - 4.5 g/dL   Albumin/Globulin Ratio 1.6 1.2 - 2.2   Bilirubin Total 0.2 0.0 - 1.2 mg/dL   Alkaline Phosphatase 62 39 - 117 IU/L   AST 16 0 - 40 IU/L   ALT 17 0 - 32 IU/L  Lipid Panel w/o Chol/HDL Ratio  Result Value Ref Range   Cholesterol, Total 199 100 - 199 mg/dL   Triglycerides 257 (H) 0 - 149 mg/dL   HDL 31 (L) >39 mg/dL   VLDL Cholesterol Cal 51 (H) 5 - 40 mg/dL   LDL Calculated 117 (H) 0 - 99 mg/dL  TSH  Result Value Ref Range   TSH 3.040 0.450 - 4.500 uIU/mL  UA/M w/rflx Culture, Routine   Specimen: Urine   URINE  Result Value Ref Range   Specific Gravity, UA <1.005 (L) 1.005 - 1.030   pH, UA 7.0 5.0 - 7.5   Color, UA Straw Yellow   Appearance Ur Hazy (A) Clear   Leukocytes, UA 1+ (A) Negative   Protein, UA Negative Negative/Trace   Glucose, UA Negative Negative   Ketones, UA Negative Negative   RBC, UA Trace (A) Negative   Bilirubin, UA Negative Negative   Urobilinogen, Ur 0.2 0.2 - 1.0 mg/dL   Nitrite, UA Negative Negative   Microscopic Examination See below:       Assessment &  Plan:   Problem List Items Addressed This Visit    None    Visit Diagnoses    Routine general medical  examination at a health care facility    -  Primary   Vaccines up to date/declined. Screening labs checked today. Pap done. Continue diet and exercise. Call with any concerns.    Relevant Orders   CBC with Differential OUT   Comp Met (CMET)   Lipid Panel w/o Chol/HDL Ratio OUT   TSH   UA/M w/rflx Culture, Routine   Screening for cervical cancer       Pap done today.   Relevant Orders   Pap over 30   Encounter for preconception consultation       Unable to see strings to remove IUD- will get her into GYN. Call with any concerns. Will start PNV.       Follow up plan: Return in about 1 year (around 09/16/2020) for Physical.   LABORATORY TESTING:  - Pap smear: pap done  IMMUNIZATIONS:   - Tdap: Tetanus vaccination status reviewed: last tetanus booster within 10 years. - Influenza: Refused - Pneumovax: Not applicable  PATIENT COUNSELING:   Advised to take 1 mg of folate supplement per day if capable of pregnancy.   Sexuality: Discussed sexually transmitted diseases, partner selection, use of condoms, avoidance of unintended pregnancy  and contraceptive alternatives.   Advised to avoid cigarette smoking.  I discussed with the patient that most people either abstain from alcohol or drink within safe limits (<=14/week and <=4 drinks/occasion for males, <=7/weeks and <= 3 drinks/occasion for females) and that the risk for alcohol disorders and other health effects rises proportionally with the number of drinks per week and how often a drinker exceeds daily limits.  Discussed cessation/primary prevention of drug use and availability of treatment for abuse.   Diet: Encouraged to adjust caloric intake to maintain  or achieve ideal body weight, to reduce intake of dietary saturated fat and total fat, to limit sodium intake by avoiding high sodium foods and not adding table salt, and to maintain adequate dietary potassium and calcium preferably from fresh fruits, vegetables, and low-fat  dairy products.    stressed the importance of regular exercise  Injury prevention: Discussed safety belts, safety helmets, smoke detector, smoking near bedding or upholstery.   Dental health: Discussed importance of regular tooth brushing, flossing, and dental visits.    NEXT PREVENTATIVE PHYSICAL DUE IN 1 YEAR. Return in about 1 year (around 09/16/2020) for Physical.

## 2019-09-18 ENCOUNTER — Other Ambulatory Visit: Payer: Self-pay | Admitting: Family Medicine

## 2019-09-18 ENCOUNTER — Encounter: Payer: Self-pay | Admitting: Family Medicine

## 2019-09-18 LAB — COMPREHENSIVE METABOLIC PANEL
ALT: 19 IU/L (ref 0–32)
AST: 15 IU/L (ref 0–40)
Albumin/Globulin Ratio: 1.6 (ref 1.2–2.2)
Albumin: 4.5 g/dL (ref 3.8–4.8)
Alkaline Phosphatase: 61 IU/L (ref 39–117)
BUN/Creatinine Ratio: 13 (ref 9–23)
BUN: 9 mg/dL (ref 6–20)
Bilirubin Total: 0.2 mg/dL (ref 0.0–1.2)
CO2: 24 mmol/L (ref 20–29)
Calcium: 9.8 mg/dL (ref 8.7–10.2)
Chloride: 101 mmol/L (ref 96–106)
Creatinine, Ser: 0.72 mg/dL (ref 0.57–1.00)
GFR calc Af Amer: 128 mL/min/{1.73_m2} (ref >59)
GFR calc non Af Amer: 111 mL/min/{1.73_m2} (ref >59)
Globulin, Total: 2.9 g/dL (ref 1.5–4.5)
Glucose: 100 mg/dL — ABNORMAL HIGH (ref 65–99)
Potassium: 4.2 mmol/L (ref 3.5–5.2)
Sodium: 141 mmol/L (ref 134–144)
Total Protein: 7.4 g/dL (ref 6.0–8.5)

## 2019-09-18 LAB — CBC WITH DIFFERENTIAL/PLATELET
Basophils Absolute: 0 10*3/uL (ref 0.0–0.2)
Basos: 0 %
EOS (ABSOLUTE): 0.1 10*3/uL (ref 0.0–0.4)
Eos: 1 %
Hematocrit: 44.1 % (ref 34.0–46.6)
Hemoglobin: 14.6 g/dL (ref 11.1–15.9)
Immature Grans (Abs): 0 10*3/uL (ref 0.0–0.1)
Immature Granulocytes: 0 %
Lymphocytes Absolute: 3.3 10*3/uL — ABNORMAL HIGH (ref 0.7–3.1)
Lymphs: 41 %
MCH: 28.3 pg (ref 26.6–33.0)
MCHC: 33.1 g/dL (ref 31.5–35.7)
MCV: 86 fL (ref 79–97)
Monocytes Absolute: 0.5 10*3/uL (ref 0.1–0.9)
Monocytes: 7 %
Neutrophils Absolute: 4 10*3/uL (ref 1.4–7.0)
Neutrophils: 51 %
Platelets: 364 10*3/uL (ref 150–450)
RBC: 5.15 x10E6/uL (ref 3.77–5.28)
RDW: 13.5 % (ref 11.7–15.4)
WBC: 8 10*3/uL (ref 3.4–10.8)

## 2019-09-18 LAB — LIPID PANEL W/O CHOL/HDL RATIO
Cholesterol, Total: 241 mg/dL — ABNORMAL HIGH (ref 100–199)
HDL: 27 mg/dL — ABNORMAL LOW (ref >39)
LDL Chol Calc (NIH): 130 mg/dL — ABNORMAL HIGH (ref 0–99)
Triglycerides: 461 mg/dL — ABNORMAL HIGH (ref 0–149)
VLDL Cholesterol Cal: 84 mg/dL — ABNORMAL HIGH (ref 5–40)

## 2019-09-18 LAB — TSH: TSH: 2.79 u[IU]/mL (ref 0.450–4.500)

## 2019-09-20 LAB — UA/M W/RFLX CULTURE, ROUTINE
Bilirubin, UA: NEGATIVE
Glucose, UA: NEGATIVE
Nitrite, UA: NEGATIVE
Protein,UA: NEGATIVE
Specific Gravity, UA: 1.025 (ref 1.005–1.030)
Urobilinogen, Ur: 0.2 mg/dL (ref 0.2–1.0)
pH, UA: 7 (ref 5.0–7.5)

## 2019-09-20 LAB — MICROSCOPIC EXAMINATION

## 2019-09-20 LAB — URINE CULTURE, REFLEX

## 2019-09-23 ENCOUNTER — Telehealth: Payer: Self-pay | Admitting: Family Medicine

## 2019-09-23 LAB — CYTOLOGY - PAP
Comment: NEGATIVE
Comment: NEGATIVE
Comment: NEGATIVE
Diagnosis: NEGATIVE
HPV 16: NEGATIVE
HPV 18 / 45: NEGATIVE
High risk HPV: POSITIVE — AB

## 2019-09-23 LAB — CERVICOVAGINAL ANCILLARY ONLY
HPV 16/18/45 genotyping: NEGATIVE
HPV: DETECTED — AB

## 2019-09-23 NOTE — Telephone Encounter (Signed)
Done

## 2019-09-23 NOTE — Telephone Encounter (Signed)
Please contact patient

## 2019-09-23 NOTE — Telephone Encounter (Signed)
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 We'll repeat it next year. Thanks!

## 2019-09-23 NOTE — Telephone Encounter (Signed)
Patient notified

## 2019-11-22 ENCOUNTER — Ambulatory Visit: Payer: Commercial Managed Care - PPO | Admitting: Certified Nurse Midwife

## 2019-11-22 ENCOUNTER — Other Ambulatory Visit: Payer: Self-pay

## 2020-05-26 ENCOUNTER — Encounter: Payer: Self-pay | Admitting: Obstetrics and Gynecology

## 2020-05-26 ENCOUNTER — Ambulatory Visit: Payer: Commercial Managed Care - PPO | Admitting: Obstetrics and Gynecology

## 2020-05-26 VITALS — BP 121/82 | HR 81 | Ht 63.62 in | Wt 221.4 lb

## 2020-05-26 DIAGNOSIS — Z30432 Encounter for removal of intrauterine contraceptive device: Secondary | ICD-10-CM | POA: Diagnosis not present

## 2020-05-26 NOTE — Progress Notes (Signed)
GYNECOLOGY CLINIC PROGRESS NOTE  Hospital Spanish Interpreter used for today's visit.   Andrea Ellis is an 34 y.o. Spanish-speaking female. She presents today to discuss removal of IUD due to desiring conception. Patient is a G1P1 female. Patient states the Mirena was placed approximately 3 years ago by [employer], CNM. She denies previous forms of birth control. Patient states she occasionally feels symptoms but never has a period. Prior to insertion of the Mirena she would often go 4 months without having a period.   Pertinent Gynecological History: Menses: none currently due to IUD Contraception: Mirena (placed approx. 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 Results were: NILM, HR HPV+ (neg for 16/18/45) OB History: G1, P1    Past Medical History:  Diagnosis Date  . Headache     Past Surgical History:  Procedure Laterality Date  . APPENDECTOMY    . KNEE SURGERY    . LAPAROSCOPIC APPENDECTOMY N/A 08/22/2016   Procedure: APPENDECTOMY LAPAROSCOPIC;  Surgeon: Henrene Dodge, MD;  Location: [facility] ORS;  Service: General;  Laterality: N/A;    Family History  Problem Relation Age of Onset  . Alcohol abuse Father     Social History:  reports that she has never smoked. She has never used smokeless tobacco. She reports that she does not drink alcohol and does not use drugs.  Allergies: No Known Allergies  (Not in a hospital admission)   Review of Systems  All other systems reviewed and are negative.   Blood pressure 121/82, pulse 81, height 5' 3.62" (1.616 m), weight (!) 100.4 kg.   No results found for this or any previous visit (from the past 24 hour(s)).  No results found.   Physical Exam Constitutional:      General: She is not in acute distress.    Appearance: Normal appearance.  Abdominal:     General: Abdomen is flat.     Palpations: Abdomen is soft.     Tenderness:  There is no abdominal tenderness.  Genitourinary:    Comments: external genitalia normal, rectovaginal septum normal.  Vagina without discharge.  Cervix normal appearing, no lesions and no motion tenderness. IUD threads not visible at cervical os.  Bimanual exam not performed.  Musculoskeletal:        General: No swelling or tenderness.  Neurological:     General: No focal deficit present.     Mental Status: She is alert and oriented to person, place, and time.      Assessment/Plan: 1. Removal of IUD due to desiring conception. IUD removed today (see procedure note below. Discussed preparing for pregnancy. All questions answered. Encouraged to start a PNV.      GYNECOLOGY OFFICE PROCEDURE NOTE  Andrea Ellis is a 34 y.o. G1P1001 here for Mirena IUD removal. No GYN concerns.  Last pap smear was on 09/17/2019 and was normal.  IUD Removal  Patient identified, informed consent performed, consent signed.  Patient was in the dorsal lithotomy position, normal external genitalia was noted.  A speculum was placed in the patient's vagina, normal discharge was noted, no lesions. The cervix was visualized, no lesions, no abnormal discharge.  The strings of the IUD were grasped and pulled using ring forceps.  The strings of the IUD were not visualized, so a cytobrush, IUD hook, and finally a Kelly forceps were introduced into the endometrial cavity and the IUD was able to be grasped and removed in its entirety).  Patient tolerated the procedure well.    Patient plans for pregnancy soon and she was told to avoid teratogens, take PNV and folic acid.  Routine preventative health maintenance measures emphasized.   I have seen and examined the patient with Dominic Pea, [place] PA-S.  I have reviewed the record and concur with patient management and plan, as documented.    Hildred Laser, MD [facility] [place] 05/26/2020, 11:14 AM

## 2020-05-26 NOTE — Progress Notes (Signed)
Pt present for IUD removal. Pt stated that she would like her IUD due to wanting to conceive.

## 2020-05-26 NOTE — Patient Instructions (Signed)
Preparacin para TEFL teacher for Pregnancy Si est considerando quedar embarazada, programe una cita con su mdico de cabecera para saber cmo prepararse para un embarazo seguro y saludable (atencin previa a la concepcin). Durante una visita de atencin previa a la concepcin, su mdico:  Har un examen fsico completo, incluido una prueba de Papanicolaou.  Har una historia clnica completa.  Le brindar informacin, responder sus preguntas y la ayudar a Building services engineer. Lista de verificacin previa a la concepcin Antecedentes mdicos  Infrmele a su mdico sobre cualquier afeccin actual o pasada. Las afecciones crnicas, como la diabetes, la hipertensin crnica y los problemas de tiroides, pueden Loss adjuster, chartered o la capacidad para Scientist, research (physical sciences).  Incluya sus antecedentes mdicos familiares y los de [address].  Informe al mdico si tiene antecedentes de ETS (enfermedades de transmisin sexual).Pueden afectar su embarazo. En algunos casos, pueden transmitirse al beb. Comente cualquier inquietud que [employer] ETS.  Consulte sobre los beneficios de las pruebas genticas, si se las indicaron. Estas pruebas mostrarn si existe alguna afeccin gentica que usted o su pareja puedan transmitir al beb.  Informe al mdico acerca de lo siguiente: ? Cualquier problema que haya tenido con la concepcin o el embarazo. ? Los medicamentos que toma. Estos incluyen vitaminas, suplementos a base de hierbas y [address] de [address]. ? Sus antecedentes de vacunacin. Hable sobre las vacunas que pueda necesitar. Dieta  Pregntele a su mdico qu puede incluir en una dieta sana que tenga una cantidad equilibrada de nutrientes. Esto es especialmente importante si est embarazada o preparndose para quedar embarazada.  Pdale al mdico que la ayude a Barista un peso saludable antes del [place]. ? Si tiene sobrepeso, puede tener un mayor riesgo de tener ciertas  complicaciones, como hipertensin arterial, diabetes y Sport and exercise psychologist. ? Si tiene [employer], existe una mayor probabilidad de [place] un beb con bajo peso al nacer. Estilo de vida, Aleen Campi y hogar  Informe al mdico: ? Sobre cualquier hbito en su estilo de vida, por ejemplo, consumir alcohol, drogas o fumar. ? Acerca de actividades recreativas que puedan ponerla en riesgo durante el embarazo, como el esqu extremo y ciertos programas de ejercicio. ? Dgale al mdico si realiz viajes al exterior, en especial, a lugares con brotes del virus de [place]. ? Sobre las sustancias dainas a las que puede estar expuesta en el trabajo o en su casa. Estas incluyen productos qumicos, pesticidas, radiacin o incluso cajas de arena para gatos. ? Si no se siente segura en su casa. Salud mental  Informe al mdico acerca de lo siguiente: ? Antecedentes de trastornos de salud mental, que incluyen sentimientos de depresin, tristeza o ansiedad. ? Medicamentos que toma para alguna afeccin de salud mental. Estos incluyen hierbas y suplementos. Instrucciones para prepararse para Futures trader. Estilo de vida   Consuma una dieta equilibrada. Esto incluye frutas y verduras frescas, cereales integrales, carnes magras, productos lcteos descremados, grasas saludables y alimentos con alto contenido de fibras. Pida una cita con un nutricionista o especialista en diettica matriculado para obtener ayuda con la planificacin de comidas y las metas de Paediatric nurse.  Realice actividad fsica con regularidad. Intente estar activa al menos al da, la [employer] de la [place]. Pregntele al mdico qu actividades son seguras Academic librarian.  No consuma ningn producto que contenga nicotina o tabaco, como cigarrillos y Administrator, Civil Service. Si necesita ayuda para dejar de fumar, consulte al mdico.  No beba  alcohol.  No consuma drogas.  Mantenga un peso saludable. Pregntele al  mdico cul es el rango de peso adecuado para usted. Instrucciones generales  Lleve un registro preciso de las menstruaciones. Para que el mdico pueda determinar la fecha probable de parto con ms facilidad.  Empiece a tomar vitaminas prenatales y suplementos con cido flico diariamente, segn las indicaciones del mdico.  Trate cualquier afeccin crnica, como hipertensin arterial y diabetes, como le indique el mdico. Esto es importante. Cmo s que estoy embarazada? Puede estar embarazada si ha mantenido relaciones sexuales y no tuvo la menstruacin. Los sntomas de embarazo incipiente incluyen lo siguiente:  [place] leves.  Sangrado vaginal muy leve (manchado).  Cansancio inusual.  Nuseas y vmitos (nuseas matinales). Si tiene alguno de estos sntomas y sospecha que podra estar embarazada, puede hacerse una prueba de embarazo casera. Estas pruebas detectan la presencia de una hormona (gonadotropina corinica humana o GCH) en la orina. El organismo de la mujer comienza a producir esta hormona al principio del embarazo. Estas pruebas son muy precisas. Espere por lo menos hasta el primer da de retraso de la menstruacin. Si la prueba indica que est embarazada (obtiene un resultado positivo), llame al mdico para concertar una cita para la atencin prenatal. Qu debo hacer si quedo embarazada?      Programe una cita con su mdico apenas sospeche que est embarazada.  No uses ningn producto que contenga nicotina, como cigarrillos, tabaco de mascar y cigarrillos electrnicos. Si necesita ayuda para dejar de fumar, consulte al mdico.  No consuma bebidas alcohlicas. El alcohol se relaciona con ciertos defectos congnitos.  Evite los olores y las sustancias qumicas txicas.  Puede seguir teniendo relaciones sexuales si no le causan dolor u otros problemas, por ejemplo, sangrado vaginal. Esta informacin no tiene como fin reemplazar el consejo del mdico. Asegrese de  hacerle al mdico cualquier pregunta que tenga. Document Revised: 01/27/2018 Document Reviewed: 05/08/2016 Elsevier Patient Education  2020 Elsevier Inc.  

## 2020-09-17 ENCOUNTER — Encounter: Payer: Commercial Managed Care - PPO | Admitting: Family Medicine

## 2020-10-06 ENCOUNTER — Encounter: Payer: Commercial Managed Care - PPO | Admitting: Family Medicine

## 2020-11-19 ENCOUNTER — Telehealth (INDEPENDENT_AMBULATORY_CARE_PROVIDER_SITE_OTHER): Payer: Commercial Managed Care - PPO | Admitting: Family Medicine

## 2020-11-19 ENCOUNTER — Encounter: Payer: Self-pay | Admitting: Family Medicine

## 2020-11-19 VITALS — Temp 97.0°F

## 2020-11-19 DIAGNOSIS — Z20822 Contact with and (suspected) exposure to covid-19: Secondary | ICD-10-CM | POA: Diagnosis not present

## 2020-11-19 MED ORDER — PREDNISONE 50 MG PO TABS: 50.0000 mg | ORAL_TABLET | Freq: Every day | ORAL | 0 refills | Status: DC

## 2020-11-19 MED ORDER — BENZONATATE 200 MG PO CAPS: 200.0000 mg | ORAL_CAPSULE | Freq: Two times a day (BID) | ORAL | 0 refills | Status: DC | PRN

## 2020-11-19 MED ORDER — HYDROCOD POLST-CPM POLST ER 10-8 MG/5ML PO SUER: 5.0000 mL | Freq: Two times a day (BID) | ORAL | 0 refills | Status: DC | PRN

## 2020-11-19 NOTE — Progress Notes (Signed)
Temp (!) 97 F (36.1 C)    Subjective:    Patient ID: Andrea Ellis, female    DOB: 1985-11-23, 35 y.o.   MRN: [id number]  HPI: Andrea Ellis is a 35 y.o. female  Chief Complaint  Patient presents with  . Cough    Pt states she has been coughing since 1/15. Patient states she is having a headache and sore throat. Has been trying mucinex and allergy medicine.   UPPER RESPIRATORY TRACT INFECTION Duration: Saturday Worst symptom: cough Fever: no Cough: yes Shortness of breath: yes- with lots of cough Wheezing: no Chest pain: yes, with cough Chest tightness: yes Chest congestion: no Nasal congestion: no- was before Runny nose: yes Post nasal drip: no Sneezing: no Sore throat: yes Swollen glands: no Sinus pressure: yes Headache: yes Face pain: no Toothache: no Ear pain: no  Ear pressure: no  Eyes red/itching:no Eye drainage/crusting: no  Vomiting: no Rash: no Fatigue: yes Sick contacts: no Strep contacts: no  Context: worse Recurrent sinusitis: no Relief with OTC cold/cough medications: no  Treatments attempted: none   Relevant past medical, surgical, family and social history reviewed and updated as indicated. Interim medical history since our last visit reviewed. Allergies and medications reviewed and updated.  Review of Systems  Constitutional: Positive for fatigue. Negative for activity change, appetite change, chills, diaphoresis, fever and unexpected weight change.  HENT: Negative.   Respiratory: Negative.   Cardiovascular: Negative.   Neurological: Negative.   Psychiatric/Behavioral: Negative.     Per HPI unless specifically indicated above     Objective:    Temp (!) 97 F (36.1 C)   Wt Readings from Last 3 Encounters:  05/26/20 (!) 221 lb 6.4 oz (100.4 kg)  09/17/19 227 lb 4 oz (103.1 kg)  09/11/18 225 lb (102.1 kg)    Physical Exam Vitals and nursing note reviewed.  Pulmonary:     Effort: Pulmonary effort is normal. No  respiratory distress.     Comments: Speaking in full sentences Neurological:     Mental Status: She is alert.  Psychiatric:        Mood and Affect: Mood normal.        Behavior: Behavior normal.        Thought Content: Thought content normal.        Judgment: Judgment normal.     Results for orders placed or performed in visit on 09/18/19  Cervicovaginal ancillary only  Result Value Ref Range   HPV **DETECTED** (A)    HPV 16/18/45 genotyping NEGATIVE for HPV 16 & 18/45       Assessment & Plan:   Problem List Items Addressed This Visit   None   Visit Diagnoses    Suspected COVID-19 virus infection    -  Primary   Will get swabbed today. Self-quartine until results are back. Treat symptomatically as below. Call if not getting better or getting worse.    Relevant Orders   Novel Coronavirus, NAA ([facility])       Follow up plan: Return if symptoms worsen or fail to improve.   . This visit was completed via MyChart due to the restrictions of the COVID-19 pandemic. All issues as above were discussed and addressed. Physical exam was done as above through visual confirmation on MyChart. If it was felt that the patient should be evaluated in the office, they were directed there. The patient verbally consented to this visit. . Location of the patient: home . Location of the provider: work .  Those involved with this call:  . Provider: Olevia Perches, DO . CMA: Rolley Sims, CMA . Front Desk/Registration: Harriet Pho  . Time spent on call: 15 minutes with patient face to face via video conference. More than 50% of this time was spent in counseling and coordination of care. 23 minutes total spent in review of patient's record and preparation of their chart.

## 2020-11-19 NOTE — Addendum Note (Signed)
Addended by: Pablo Ledger on: 11/19/2020 04:01 PM   Modules accepted: Orders

## 2020-11-21 LAB — SARS-COV-2, NAA 2 DAY TAT

## 2020-11-21 LAB — NOVEL CORONAVIRUS, NAA: SARS-CoV-2, NAA: NOT DETECTED

## 2020-11-23 NOTE — Progress Notes (Signed)
Patient notified

## 2021-01-14 ENCOUNTER — Encounter: Payer: Self-pay | Admitting: Family Medicine

## 2021-02-02 ENCOUNTER — Other Ambulatory Visit: Payer: Self-pay

## 2021-02-02 ENCOUNTER — Encounter: Payer: Self-pay | Admitting: Family Medicine

## 2021-02-02 ENCOUNTER — Ambulatory Visit (INDEPENDENT_AMBULATORY_CARE_PROVIDER_SITE_OTHER): Payer: Commercial Managed Care - PPO | Admitting: Family Medicine

## 2021-02-02 ENCOUNTER — Other Ambulatory Visit (HOSPITAL_COMMUNITY)
Admission: RE | Admit: 2021-02-02 | Discharge: 2021-02-02 | Disposition: A | Discharge status: Home / Self Care | Payer: Commercial Managed Care - PPO | Source: Ambulatory Visit | Attending: Family Medicine | Admitting: Family Medicine

## 2021-02-02 VITALS — BP 116/80 | HR 80 | Temp 98.0°F | Ht 64.5 in | Wt 238.0 lb

## 2021-02-02 DIAGNOSIS — Z124 Encounter for screening for malignant neoplasm of cervix: Secondary | ICD-10-CM | POA: Diagnosis present

## 2021-02-02 DIAGNOSIS — M25551 Pain in right hip: Secondary | ICD-10-CM | POA: Diagnosis not present

## 2021-02-02 DIAGNOSIS — Z0000 Encounter for general adult medical examination without abnormal findings: Secondary | ICD-10-CM | POA: Diagnosis not present

## 2021-02-02 DIAGNOSIS — M5442 Lumbago with sciatica, left side: Secondary | ICD-10-CM | POA: Diagnosis not present

## 2021-02-02 DIAGNOSIS — K649 Unspecified hemorrhoids: Secondary | ICD-10-CM

## 2021-02-02 DIAGNOSIS — K219 Gastro-esophageal reflux disease without esophagitis: Secondary | ICD-10-CM

## 2021-02-02 DIAGNOSIS — G8929 Other chronic pain: Secondary | ICD-10-CM

## 2021-02-02 LAB — URINALYSIS, ROUTINE W REFLEX MICROSCOPIC
Bilirubin, UA: NEGATIVE
Glucose, UA: NEGATIVE
Ketones, UA: NEGATIVE
Nitrite, UA: NEGATIVE
Protein,UA: NEGATIVE
Specific Gravity, UA: <1.005 — ABNORMAL LOW (ref 1.005–1.030)
Urobilinogen, Ur: 0.2 mg/dL (ref 0.2–1.0)
pH, UA: 6 (ref 5.0–7.5)

## 2021-02-02 LAB — MICROSCOPIC EXAMINATION: RBC, Urine: NONE SEEN /hpf (ref 0–2)

## 2021-02-02 MED ORDER — OMEPRAZOLE 20 MG PO CPDR: 20.0000 mg | DELAYED_RELEASE_CAPSULE | Freq: Every day | ORAL | 3 refills | Status: DC

## 2021-02-02 MED ORDER — HYDROCORTISONE ACETATE 25 MG RE SUPP: 25.0000 mg | Freq: Two times a day (BID) | RECTAL | 0 refills | Status: DC

## 2021-02-02 NOTE — Progress Notes (Signed)
BP 116/80   Pulse 80   Temp 98 F (36.7 C)   Ht 5' 4.5" (1.638 m)   Wt 238 lb (108 kg)   SpO2 99%   BMI 40.22 kg/m    Subjective:    Patient ID: Andrea Ellis, female    DOB: 12-28-1985, 35 y.o.   MRN: [id number]  HPI: Andrea BarrierJeaneth Moldovan is a 35 y.o. female presenting on 02/02/2021 for comprehensive medical examination. Current medical complaints include:  Has been a bit constipated for the past couple of weeks and had a little bump and pain when she was going to the bathroom a couple of days ago. Has a little bit of blood.  Back continues to really hurt. She notes that it has been hurting for >5 years. Sometimes it aches so much that she can't do anything. Its in bilateral low back pain and radiates into her L leg and into her R hip. Nothing makes it better and extension makes it worse.   Menopausal Symptoms: no  Depression Screen done today and results listed below:  Depression screen [facility] 02/02/2021 02/02/2021 09/17/2019 09/11/2018 09/11/2018  Decreased Interest 1 1 0 2 0  Down, Depressed, Hopeless 1 1 0 1 0  PHQ - 2 Score 2 2 0 3 0  Altered sleeping 0 0 - 0 0  Tired, decreased energy 2 2 - 1 0  Change in appetite 1 1 - 0 0  Feeling bad or failure about yourself  1 1 - 0 0  Trouble concentrating 0 0 - 0 0  Moving slowly or fidgety/restless 0 0 - 0 0  Suicidal thoughts 0 0 - 0 0  PHQ-9 Score 6 6 - 4 0  Difficult doing work/chores - Not difficult at all - - Not difficult at all    Past Medical History:  Past Medical History:  Diagnosis Date  . Headache     Surgical History:  Past Surgical History:  Procedure Laterality Date  . APPENDECTOMY    . KNEE SURGERY    . LAPAROSCOPIC APPENDECTOMY N/A 08/22/2016   Procedure: APPENDECTOMY LAPAROSCOPIC;  Surgeon: Henrene DodgeJose Piscoya, MD;  Location: [facility] ORS;  Service: General;  Laterality: N/A;    Medications:  No current outpatient medications on file prior to visit.   No current facility-administered medications on file  prior to visit.    Allergies:  No Known Allergies  Social History:  Social History   Socioeconomic History  . Marital status: Married    Spouse name: Not on file  . Number of children: Not on file  . Years of education: Not on file  . Highest education level: Not on file  Occupational History  . Occupation: manufactoring  Tobacco Use  . Smoking status: Never Smoker  . Smokeless tobacco: Never Used  Vaping Use  . Vaping Use: Not on file  Substance and Sexual Activity  . Alcohol use: No    Alcohol/week: 0.0 standard drinks  . Drug use: No  . Sexual activity: Yes    Partners: Male    Birth control/protection: I.U.D.    Comment: mirena  Other Topics Concern  . Not on file  Social History Narrative  . Not on file   Social Determinants of Health   Financial Resource Strain: Not on file  Food Insecurity: Not on file  Transportation Needs: Not on file  Physical Activity: Not on file  Stress: Not on file  Social Connections: Not on file  Intimate Partner Violence: Not on file  Social History   Tobacco Use  Smoking Status Never Smoker  Smokeless Tobacco Never Used   Social History   Substance and Sexual Activity  Alcohol Use No  . Alcohol/week: 0.0 standard drinks    Family History:  Family History  Problem Relation Age of Onset  . Alcohol abuse Father   . Lung disease Mother     Past medical history, surgical history, medications, allergies, family history and social history reviewed with patient today and changes made to appropriate areas of the chart.   Review of Systems  Constitutional: Negative.   HENT: Negative.   Eyes: Negative.   Respiratory: Negative.   Cardiovascular: Negative.   Gastrointestinal: Positive for blood in stool, constipation and heartburn. Negative for abdominal pain, diarrhea, melena, nausea and vomiting.  Genitourinary: Negative.   Musculoskeletal: Positive for back pain and myalgias. Negative for falls, joint pain and neck  pain.  Skin: Negative.    All other ROS negative except what is listed above and in the HPI.      Objective:    BP 116/80   Pulse 80   Temp 98 F (36.7 C)   Ht 5' 4.5" (1.638 m)   Wt 238 lb (108 kg)   SpO2 99%   BMI 40.22 kg/m   Wt Readings from Last 3 Encounters:  02/02/21 238 lb (108 kg)  05/26/20 (!) 221 lb 6.4 oz (100.4 kg)  09/17/19 227 lb 4 oz (103.1 kg)    Physical Exam Vitals and nursing note reviewed. Exam conducted with a chaperone present.  Constitutional:      General: She is not in acute distress.    Appearance: Normal appearance. She is not ill-appearing, toxic-appearing or diaphoretic.  HENT:     Head: Normocephalic and atraumatic.     Right Ear: Tympanic membrane, ear canal and external ear normal. There is no impacted cerumen.     Left Ear: Tympanic membrane, ear canal and external ear normal. There is no impacted cerumen.     Nose: Nose normal. No congestion or rhinorrhea.     Mouth/Throat:     Mouth: Mucous membranes are moist.     Pharynx: Oropharynx is clear. No oropharyngeal exudate or posterior oropharyngeal erythema.  Eyes:     General: No scleral icterus.       Right eye: No discharge.        Left eye: No discharge.     Extraocular Movements: Extraocular movements intact.     Conjunctiva/sclera: Conjunctivae normal.     Pupils: Pupils are equal, round, and reactive to light.  Neck:     Vascular: No carotid bruit.  Cardiovascular:     Rate and Rhythm: Normal rate and regular rhythm.     Pulses: Normal pulses.     Heart sounds: No murmur heard. No friction rub. No gallop.   Pulmonary:     Effort: Pulmonary effort is normal. No respiratory distress.     Breath sounds: Normal breath sounds. No stridor. No wheezing, rhonchi or rales.  Chest:     Chest wall: No tenderness.  Breasts:     Right: Normal. No swelling, bleeding, inverted nipple, mass, nipple discharge, skin change or tenderness.     Left: Normal. No swelling, bleeding, inverted  nipple, mass, nipple discharge, skin change or tenderness.    Abdominal:     General: Abdomen is flat. Bowel sounds are normal. There is no distension.     Palpations: Abdomen is soft. There is no mass.  Tenderness: There is no abdominal tenderness. There is no right CVA tenderness, left CVA tenderness, guarding or rebound.     Hernia: No hernia is present.  Genitourinary:    Labia:        Right: No rash, tenderness, lesion or injury.        Left: No rash, tenderness, lesion or injury.      Vagina: Normal.     Cervix: Normal.     Uterus: Normal.      Adnexa: Right adnexa normal and left adnexa normal.     Rectum: Internal hemorrhoid present.  Musculoskeletal:        General: No swelling, tenderness, deformity or signs of injury.     Cervical back: Normal range of motion and neck supple. No rigidity. No muscular tenderness.     Right lower leg: No edema.     Left lower leg: No edema.  Lymphadenopathy:     Cervical: No cervical adenopathy.  Skin:    General: Skin is warm and dry.     Capillary Refill: Capillary refill takes less than 2 seconds.     Coloration: Skin is not jaundiced or pale.     Findings: No bruising, erythema, lesion or rash.  Neurological:     General: No focal deficit present.     Mental Status: She is alert and oriented to person, place, and time. Mental status is at baseline.     Cranial Nerves: No cranial nerve deficit.     Sensory: No sensory deficit.     Motor: No weakness.     Coordination: Coordination normal.     Gait: Gait normal.     Deep Tendon Reflexes: Reflexes normal.  Psychiatric:        Mood and Affect: Mood normal.        Behavior: Behavior normal.        Thought Content: Thought content normal.        Judgment: Judgment normal.     Results for orders placed or performed in visit on 11/19/20  Novel Coronavirus, NAA ([facility])   Specimen: Saline  Result Value Ref Range   SARS-CoV-2, NAA Not Detected Not Detected  SARS-COV-2, NAA 2  DAY TAT  Result Value Ref Range   SARS-CoV-2, NAA 2 DAY TAT Performed       Assessment & Plan:   Problem List Items Addressed This Visit      Digestive   Gastroesophageal reflux disease    Will start omeprazole. Call with any concerns. Continue to monitor.       Relevant Medications   omeprazole (PRILOSEC) 20 MG capsule     Nervous and Auditory   Chronic bilateral low back pain with left-sided sciatica    Will get x-rays. Await results- may need PT/MRI given chonicity      Relevant Orders   DG Lumbar Spine Complete    Other Visit Diagnoses    Routine general medical examination at a health care facility    -  Primary   Vaccines up to date. Screening labs checked today. Pap done. Continue diet and exercise. Call with any concerns.    Relevant Orders   CBC with Differential/Platelet   Comprehensive metabolic panel   Lipid Panel w/o Chol/HDL Ratio   Cytology - PAP   Urinalysis, Routine w reflex microscopic   TSH   Hepatitis C Antibody   Right hip pain       Will check x-ray await results. Treat as needed.  Relevant Orders   DG Hip Unilat W OR W/O Pelvis 2-3 Views Right   Hemorrhoids, unspecified hemorrhoid type       Will treat with anusol. Call with any concerns. Continue to monitor.    Screening for cervical cancer       Pap done today.    Relevant Orders   Cytology - PAP       Follow up plan: Return in about 4 weeks (around 03/02/2021) for Follow up back.   LABORATORY TESTING:  - Pap smear: pap done  IMMUNIZATIONS:   - Tdap: Tetanus vaccination status reviewed: last tetanus booster within 10 years. - Influenza: Postponed to flu season - Pneumovax: Not applicable - COVID: Up to date  PATIENT COUNSELING:   Advised to take 1 mg of folate supplement per day if capable of pregnancy.   Sexuality: Discussed sexually transmitted diseases, partner selection, use of condoms, avoidance of unintended pregnancy  and contraceptive alternatives.   Advised to avoid  cigarette smoking.  I discussed with the patient that most people either abstain from alcohol or drink within safe limits (<=14/week and <=4 drinks/occasion for males, <=7/weeks and <= 3 drinks/occasion for females) and that the risk for alcohol disorders and other health effects rises proportionally with the number of drinks per week and how often a drinker exceeds daily limits.  Discussed cessation/primary prevention of drug use and availability of treatment for abuse.   Diet: Encouraged to adjust caloric intake to maintain  or achieve ideal body weight, to reduce intake of dietary saturated fat and total fat, to limit sodium intake by avoiding high sodium foods and not adding table salt, and to maintain adequate dietary potassium and calcium preferably from fresh fruits, vegetables, and low-fat dairy products.    stressed the importance of regular exercise  Injury prevention: Discussed safety belts, safety helmets, smoke detector, smoking near bedding or upholstery.   Dental health: Discussed importance of regular tooth brushing, flossing, and dental visits.    NEXT PREVENTATIVE PHYSICAL DUE IN 1 YEAR. Return in about 4 weeks (around 03/02/2021) for Follow up back.

## 2021-02-02 NOTE — Patient Instructions (Addendum)
Puede otener sus radiografas en la oficina de [facility]: [address] [place], [place] [address] Phone: [phone] Lunes-Jueves (sin radiografa Scientist, clinical (histocompatibility and immunogenetics))  Mantenimiento de Radiographer, therapeutic en las mujeres Health Maintenance, Female Adoptar un estilo de vida saludable y recibir atencin preventiva son importantes para promover la salud y Counsellor. Consulte al mdico sobre:  El esquema adecuado para hacerse pruebas y exmenes peridicos.  Cosas que puede hacer por su cuenta para prevenir enfermedades y [employer]. Qu debo saber sobre la dieta, el peso y el ejercicio? Consuma una dieta saludable  Consuma una dieta que incluya muchas verduras, frutas, productos lcteos con bajo contenido de [place] y Associate Professor.  No consuma muchos alimentos ricos en grasas slidas, azcares agregados o sodio.   Mantenga un peso saludable El ndice de masa muscular [facility]) se [place] para identificar problemas de [place]. Proporciona una estimacin de la grasa corporal basndose en el peso y la altura. Su mdico puede ayudarle a Engineer, site IMC y a Personnel officer o Pharmacologist un peso saludable. Haga ejercicio con regularidad Haga ejercicio con regularidad. Esta es una de las prcticas ms importantes que puede hacer por su salud. La mayora de los adultos deben seguir estas pautas:  Education officer, environmental, al menos, de actividad fsica por semana. El ejercicio debe aumentar la frecuencia cardaca y Media planner transpirar (ejercicio de intensidad moderada).  Hacer ejercicios de fortalecimiento por lo [employer] por semana. Agregue esto a su plan de ejercicio de intensidad moderada.  Pasar menos tiempo sentados. Incluso la actividad fsica ligera puede ser beneficiosa. Controle sus niveles de colesterol y lpidos en la sangre Comience a realizarse anlisis de lpidos y Oncologist en la sangre a los 20aos y luego reptalos cada 5aos. Hgase controlar los niveles de colesterol con mayor frecuencia  si:  Sus niveles de lpidos y colesterol son altos.  Es mayor de 40aos.  Presenta un alto riesgo de padecer enfermedades cardacas. Qu debo saber sobre las pruebas de deteccin del cncer? Segn su historia clnica y sus antecedentes familiares, es posible que deba realizarse pruebas de deteccin del cncer en diferentes edades. Esto puede incluir pruebas de deteccin de lo siguiente:  Cncer de mama.  Cncer de cuello uterino.  Cncer colorrectal.  Cncer de piel.  Cncer de pulmn. Qu debo saber sobre la enfermedad cardaca, la diabetes y la hipertensin arterial? Presin arterial y enfermedad cardaca  La hipertensin arterial causa enfermedades cardacas y [place] el riesgo de accidente cerebrovascular. Es ms probable que esto se manifieste en las personas que tienen lecturas de presin arterial alta, tienen ascendencia africana o tienen sobrepeso.  Hgase controlar la presin arterial: ? Cada 3 a 5 aos si tiene entre 18 y 78 aos. ? Todos los aos si es mayor de [place]. Diabetes Realcese exmenes de deteccin de la diabetes con regularidad. Este anlisis revisa el nivel de azcar en la sangre en [place]. Hgase las pruebas de deteccin:  Cada tresaos despus de los 40aos de edad si tiene un peso normal y un bajo riesgo de padecer diabetes.  Con ms frecuencia y a partir de [place] edad inferior si tiene sobrepeso o un alto riesgo de padecer diabetes. Qu debo saber sobre la prevencin de infecciones? Hepatitis B Si tiene un riesgo ms alto de contraer hepatitis B, debe someterse a un examen de deteccin de este virus. Hable con el mdico para averiguar si tiene riesgo de contraer la infeccin por hepatitis B. Hepatitis C Se recomienda el anlisis a:  Todos  los que [employer] 1944 y [phone].  Todas las personas que tengan un riesgo de haber contrado hepatitis C. Enfermedades de transmisin sexual (ETS)  Hgase las pruebas de Airline pilot de ITS, incluidas la  gonorrea y la clamidia, si: ? Es sexualmente activa y es menor de [place]. ? Es mayor de 24aos, y Public affairs consultant informa que corre riesgo de tener este tipo de infecciones. ? La actividad sexual ha cambiado desde que le hicieron la ltima prueba de deteccin y tiene un riesgo mayor de Warehouse manager clamidia o Copy. Pregntele al mdico si usted tiene riesgo.  Pregntele al mdico si usted tiene un alto riesgo de Primary school teacher VIH. El mdico tambin puede recomendarle un medicamento recetado para ayudar a evitar la infeccin por el VIH. Si elige tomar medicamentos para prevenir el VIH, primero debe [employer] de deteccin del VIH. Luego debe hacerse anlisis cada mientras est tomando los medicamentos. Embarazo  Si est por dejar de Armed forces training and education officer (fase premenopusica) y usted puede quedar [place], busque asesoramiento antes de [place].  Tome de 400 a (mcg) de cido [employer] si [place].  Pida mtodos de control de la natalidad (anticonceptivos) si desea evitar un embarazo no deseado. Osteoporosis y [place] La osteoporosis es una enfermedad en la que los huesos pierden los minerales y la fuerza por el avance de la edad. El resultado pueden ser fracturas en los [place]. Si tiene 65aos o ms, o si est en riesgo de sufrir osteoporosis y fracturas, pregunte a su mdico si debe:  Hacerse pruebas de deteccin de prdida sea.  Tomar un suplemento de calcio o de vitamina D para reducir el riesgo de fracturas.  Recibir terapia de reemplazo hormonal (TRH) para tratar los sntomas de la menopausia. Siga estas instrucciones en su casa: Estilo de vida  No consuma ningn producto que contenga nicotina o tabaco, como cigarrillos, cigarrillos electrnicos y tabaco de Theatre manager. Si necesita ayuda para dejar de fumar, consulte al mdico.  No consuma drogas.  No comparta agujas.  Solicite ayuda a su mdico si necesita apoyo o informacin para  abandonar las drogas. Consumo de alcohol  No beba alcohol si: ? Su mdico le indica no hacerlo. ? Est embarazada, puede estar embarazada o est tratando de quedar embarazada.  Si bebe alcohol: ? Limite la cantidad que consume de 0 a 1 medida por da. ? Limite la ingesta si est amamantando.  Est atento a la cantidad de alcohol que hay en las bebidas que toma. En los [place], una medida equivale a una botella de cerveza de 12oz ( ), un vaso de vino de 5oz ( ) o un vaso de una bebida alcohlica de alta graduacin de 1oz (66ml). Instrucciones generales  Realcese los estudios de rutina de la salud, dentales y de Wellsite geologist.  Mantngase al da con las vacunas.  Infrmele a su mdico si: ? Se siente deprimida con frecuencia. ? Alguna vez ha sido vctima de [place] o no se siente segura en su casa. Resumen  Adoptar un estilo de vida saludable y recibir atencin preventiva son importantes para promover la salud y Counsellor.  Siga las instrucciones del mdico acerca de una dieta saludable, el ejercicio y la realizacin de pruebas o exmenes para Hotel manager.  Siga las instrucciones del mdico con respecto al control del colesterol y la presin arterial. Esta informacin no tiene Theme park manager el consejo del mdico. Asegrese de hacerle al mdico cualquier pregunta que tenga. Document Revised: 11/07/2018  Document Reviewed: 11/07/2018 Elsevier Patient Education  2021 Elsevier Inc.  Opciones de alimentos para pacientes adultos con enfermedad de reflujo gastroesofgico Food Choices for Gastroesophageal Reflux Disease, Adult Si tiene enfermedad de reflujo gastroesofgico (ERGE), los alimentos que consume y los hbitos de alimentacin son Engineer, production. Elegir los alimentos adecuados puede ayudar a [employer]. Piense en consultar a un experto en alimentacin (nutricionista) para que lo ayude a Associate Professor. Consejos para seguir  Consulting civil engineer las etiquetas de los alimentos  Elija alimentos que tengan bajo contenido de grasas saturadas. Los alimentos que pueden ayudar con los sntomas [employer] siguientes: ? Alimentos que tienen menos del 5% de los valores diarios (VD) de grasa. ? Alimentos que tienen 0 gramos de grasas trans. Al cocinar  No frer los alimentos.  Cocinar la comida al horno, al vapor, a la plancha o en la parrilla. Estos son mtodos que no necesitan mucha grasa para Water quality scientist.  Para agregar sabor, trate de consumir hierbas con bajo contenido de picante y [place]. Planificacin de las comidas  Elegir alimentos saludables con bajo contenido de [place], por ejemplo: ? Frutas y vegetales. ? Cereales integrales. ? Productos lcteos con bajo contenido de grasa. ? Vita [place], pescado y aves.  Haga comidas pequeas frecuentes en lugar de 3 comidas abundantes al da. Coma lentamente, en un lugar donde est relajado. Evite agacharse o recostarse hasta 2 o 3horas despus de haber comido.  Limite los alimentos con alto contenido graso como las carnes grasas o los alimentos fritos.  Limite su ingesta de alimentos grasos, como aceites, [place] y [place].  Evite lo siguiente si el mdico se lo indica: ? Consumir alimentos que le ocasionen sntomas. Pueden ser distintos para cada persona. Lleve un registro de los alimentos para identificar aquellos que le causen sntomas. ? Alcohol. ? Beber mucha cantidad de lquido con las comidas. ? Comer 2 o 3 horas antes de acostarse.   Estilo de vida  Mantenga un peso saludable. Pregntele a su mdico cul es el peso saludable para usted. Si necesita perder peso, hable con su mdico para hacerlo de manera segura.  Haga actividad fsica durante, al menos, 30 minutos 5 das por semana o ms, o como se lo haya indicado el mdico.  Use ropa suelta.  No fume ni consuma ningn producto que contenga nicotina o tabaco. Si necesita ayuda para dejar de fumar, consulte  al mdico.  Duerma con la cabecera de la cama ms elevada que los pies. Use una cua debajo del colchn o bloques debajo del armazn de la cama para Pharmacologist la cabecera de la cama elevada.  Mastique chicle sin azcar despus de las comidas. Qu alimentos debo comer? Siga una dieta saludable y bien equilibrada que incluya abundantes frutas, verduras, cereales integrales, productos lcteos descremados, carnes magras, pescado y aves. Cada persona es diferente. Los alimentos que pueden causar sntomas en una persona pueden no causar sntomas en otra persona. Trabaje con el mdico para hallar alimentos que sean seguros para usted. Es posible que los productos que se enumeran ms [place] no constituyan una lista completa de lo que puede comer y Product manager. Pngase en contacto con un experto en alimentos para conocer ms opciones.   Qu alimentos debo evitar? Limitar algunos de estos alimentos puede ayudar a Chief Operating Officer los sntomas de [place]. Cada persona es diferente. Consulte a un experto en alimentacin o a su mdico para que lo ayude a Clinical research associate los alimentos exactos que debe evitar, si los hubiere. Nils Pyle  Cualquier fruta que est preparada con grasa agregada. Cualquier fruta que le ocasione sntomas. Para algunas personas, estas pueden incluir, las frutas ctricas como naranja, pomelo, pia y limn. Verduras Verduras fritas en abundante aceite. Papas fritas. Cualquier verdura que est preparada con grasa agregada. Cualquier verdura que le ocasione sntomas. Para algunas personas, estas pueden incluir tomates y productos con tomate, [place], cebollas y [place], y rbanos picantes. Granos Pasteles o panes sin levadura con grasa agregada. Carnes y [address] protenas [address] de alto contenido graso como carne grasa de vaca o cerdo, salchichas, costillas, jamn, salchicha, salame y tocino. Carnes o protenas fritas, lo que incluye pescado frito y pollo frito. Frutos secos y Civil engineer, contracting de frutos secos, en grandes  cantidades. Lcteos Leche entera y [place] con chocolate. Tami Ribas. Crema. Helado. Queso crema. Batidos con [employer]. Grasas y [employer]. Margarina. Lardo. Mantequilla clarificada. Bebidas Caf y t negro, con o sin cafena. Bebidas con gas. Refrescos. Bebidas energizantes. Jugo de fruta hecho con frutas cidas, como naranja o pomelo. Jugo de tomate. Bebidas alcohlicas. Dulces y postres Chocolate y cacao. [place]. Alios y condimentos Pimienta. Menta y mentol. Sal agregada. Cualquier condimento, hierbas o aderezos que le ocasionen sntomas. Para algunas personas, esto puede incluir curry, salsa picante o aderezos para ensalada a base de vinagre. Es posible que los productos que se enumeran ms arriba no constituyan una lista completa de lo que no debera comer y Product manager. Pngase en contacto con un experto en alimentos para conocer ms opciones. Preguntas para hacerle al [employer] Los cambios en la dieta y en el estilo de vida a menudo son los primeros pasos que se toman para Company secretary los sntomas de [place]. Si los [employer] dieta y el estilo de vida no ayudan, hable con el mdico sobre el uso de medicamentos. Dnde buscar ms informacin  Arts development officer for Gastrointestinal Disorders (Fundacin Internacional para los Trastornos Gastrointestinales): aboutgerd.org Resumen  Si tiene [employer], las elecciones de alimentos y el [place] de vida son muy importantes para ayudar a Consulting civil engineer sntomas.  Haga comidas pequeas durante Glass blower/designer de 3 comidas abundantes. Coma lentamente y en un lugar donde est relajado.  Evite agacharse o recostarse hasta 2 o 3horas despus de haber comido.  Limite los alimentos con alto contenido graso como las carnes grasas o los alimentos fritos. Esta informacin no tiene Theme park manager el consejo del mdico. Asegrese de hacerle al mdico cualquier pregunta que tenga. Document Revised: 05/29/2020 Document Reviewed: 05/29/2020 Elsevier  Patient Education  2021 Elsevier Inc.  Hemorroides Hemorrhoids Las hemorroides son venas inflamadas que pueden desarrollarse:  En el ano (recto). Estas se denominan hemorroides internas.  Alrededor de la abertura del ano. Estas se denominan hemorroides externas. Las hemorroides pueden causar dolor, picazn o hemorragias. Generalmente no causan problemas graves. Con frecuencia mejoran al [employer] dieta, el estilo de vida y otros tratamientos Facilities manager. Cules son las causas? Esta afeccin puede ser causada por lo siguiente:  Tener dificultad para defecar (estreimiento).  Hacer mucha fuerza (esfuerzo) para defecar.  Materia fecal lquida (diarrea).  Embarazo.  Tener mucho sobrepeso (obesidad).  Estar sentado durante largos perodos de [place].  Levantar objetos pesados u otras actividades que impliquen esfuerzo.  Sexo anal.  Andar en bicicleta por un largo perodo de tiempo. Cules son los signos o los sntomas? Los sntomas de esta afeccin incluyen los siguientes:  Engineer, mining.  Picazn o irritacin en el ano.  Sangrado proveniente del ano.  Prdida de materia fecal.  Inflamacin en la zona.  Uno o ms bultos alrededor de la abertura del ano. Cmo se diagnostica? A menudo un mdico puede diagnosticar esta afeccin al observar la zona afectada. El mdico tambin puede:  Education officer, environmental un examen que implica palpar la zona con la mano enguantada (examen rectal digital).  Examinar el interior de la zona anal utilizando un pequeo tubo (anoscopio).  Pedir anlisis de [place]. Es posible que esto se realice si ha perdido Warden/ranger.  Solicitarle un estudio que consiste en la observacin del interior del colon utilizando un tubo flexible con una cmara en el extremo (sigmoidoscopia o colonoscopa). Cmo se trata? Esta afeccin generalmente se puede tratar en el hogar. El mdico puede indicarle que cambie de Paediatric nurse, de estilo de vida o que trate de IT trainer. Si esto no da resultado, se pueden realizar procedimientos para extirpar las hemorroides o reducir Brewing technologist. Estos pueden implicar lo siguiente:  Museum/gallery conservator en la base de las hemorroides para interrumpir la irrigacin de Risk manager.  Inyectar un medicamento en las hemorroides para reducir Brewing technologist.  Dirigir un tipo de Engineer, drilling de luz hacia las hemorroides para Radio producer que se caigan.  Realizar Neomia Dear ciruga para extirpar las hemorroides o cortar la irrigacin de [place]. Siga estas indicaciones en su casa: Comida y bebida  Consuma alimentos con alto contenido de [place]. Entre ellos cereales integrales, frijoles, frutos secos, frutas y verduras.  Pregntele a su mdico acerca de tomar productos con fibra aadida en ellos (complementos defibra).  Disminuya la cantidad de grasa de la dieta. Para esto, puede hacer lo siguiente: ? Coma productos lcteos descremados. ? Coma menos carne roja. ? No consuma alimentos procesados.  Beba suficiente lquido para Photographer orina de color amarillo plido.   Control del dolor y la hinchazn  Tome un bao de agua tibia (bao de asiento) durante 20 minutos para Engineer, materials. Hgalo 3 o 4veces al da. Puede hacer esto en una baera o usar un dispositivo porttil para bao de asiento que se coloca sobre el inodoro.  Si se lo indican, aplique hielo sobre la zona dolorida. Puede ser beneficioso aplicar hielo [employer] baos con agua tibia. ? Ponga el hielo en una bolsa plstica. ? Coloque una [employer] piel y Copy. ? Coloque el hielo durante , 2 a 3veces por da.   Indicaciones generales  [employer] de venta libre y los recetados solamente como se lo haya indicado el mdico. ? Las Health Net y los medicamentos pueden usarse como se lo hayan indicado.  Haga ejercicio fsico con frecuencia. Consulte al mdico qu tipos de ejercicios son mejores para usted y qu  cantidad.  Vaya al bao cuando sienta ganas de defecar. No espere.  Evite hacer demasiada fuerza al defecar.  Mantenga el ano seco y limpio. Use papel higinico hmedo o toallitas humedecidas despus de defecar.  No pase mucho tiempo sentado en el inodoro.  Concurra a todas las visitas de [address] se lo haya indicado el mdico. Esto es importante. Comunquese con un mdico si:  Tiene dolor e hinchazn que no mejoran con el tratamiento o los medicamentos.  Tiene problemas para defecar.  No puede defecar.  Tiene dolor o hinchazn en la zona exterior de las hemorroides. Solicite ayuda inmediatamente si tiene:  Hemorragia que no se detiene. Resumen  Las hemorroides son venas hinchadas en el ano o la zona que rodea el ano.  Pueden causar dolor, picazn  o sangrado.  Consuma alimentos con alto contenido de [place] Heightsfibra. Entre ellos cereales integrales, frijoles, frutos secos, frutas y verduras.  Tome un bao de agua tibia (bao de asiento) durante 20 minutos para Engineer, materialsaliviar el dolor. Hgalo 3 o 4veces al da. Esta informacin no tiene Theme park managercomo fin reemplazar el consejo del mdico. Asegrese de hacerle al mdico cualquier pregunta que tenga. Document Revised: 04/26/2018 Document Reviewed: 04/26/2018 Elsevier Patient Education  2021 [employer]vier [employer].

## 2021-02-02 NOTE — Assessment & Plan Note (Signed)
Will start omeprazole. Call with any concerns. Continue to monitor.

## 2021-02-02 NOTE — Assessment & Plan Note (Signed)
Will get x-rays. 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

## 2021-02-03 LAB — COMPREHENSIVE METABOLIC PANEL
ALT: 28 IU/L (ref 0–32)
AST: 21 IU/L (ref 0–40)
Albumin/Globulin Ratio: 1.6 (ref 1.2–2.2)
Albumin: 4.8 g/dL (ref 3.8–4.8)
Alkaline Phosphatase: 57 IU/L (ref 44–121)
BUN/Creatinine Ratio: 12 (ref 9–23)
BUN: 9 mg/dL (ref 6–20)
Bilirubin Total: 0.5 mg/dL (ref 0.0–1.2)
CO2: 21 mmol/L (ref 20–29)
Calcium: 9.7 mg/dL (ref 8.7–10.2)
Chloride: 100 mmol/L (ref 96–106)
Creatinine, Ser: 0.74 mg/dL (ref 0.57–1.00)
Globulin, Total: 3 g/dL (ref 1.5–4.5)
Glucose: 90 mg/dL (ref 65–99)
Potassium: 4.2 mmol/L (ref 3.5–5.2)
Sodium: 139 mmol/L (ref 134–144)
Total Protein: 7.8 g/dL (ref 6.0–8.5)
eGFR: 109 mL/min/{1.73_m2} (ref >59)

## 2021-02-03 LAB — TSH: TSH: 3.94 u[IU]/mL (ref 0.450–4.500)

## 2021-02-03 LAB — CBC WITH DIFFERENTIAL/PLATELET
Basophils Absolute: 0 10*3/uL (ref 0.0–0.2)
Basos: 1 %
EOS (ABSOLUTE): 0.1 10*3/uL (ref 0.0–0.4)
Eos: 1 %
Hematocrit: 44.9 % (ref 34.0–46.6)
Hemoglobin: 14.9 g/dL (ref 11.1–15.9)
Immature Grans (Abs): 0 10*3/uL (ref 0.0–0.1)
Immature Granulocytes: 0 %
Lymphocytes Absolute: 3.3 10*3/uL — ABNORMAL HIGH (ref 0.7–3.1)
Lymphs: 44 %
MCH: 28.5 pg (ref 26.6–33.0)
MCHC: 33.2 g/dL (ref 31.5–35.7)
MCV: 86 fL (ref 79–97)
Monocytes Absolute: 0.5 10*3/uL (ref 0.1–0.9)
Monocytes: 6 %
Neutrophils Absolute: 3.7 10*3/uL (ref 1.4–7.0)
Neutrophils: 48 %
Platelets: 341 10*3/uL (ref 150–450)
RBC: 5.22 x10E6/uL (ref 3.77–5.28)
RDW: 13.2 % (ref 11.7–15.4)
WBC: 7.7 10*3/uL (ref 3.4–10.8)

## 2021-02-03 LAB — LIPID PANEL W/O CHOL/HDL RATIO
Cholesterol, Total: 232 mg/dL — ABNORMAL HIGH (ref 100–199)
HDL: 30 mg/dL — ABNORMAL LOW (ref >39)
LDL Chol Calc (NIH): 156 mg/dL — ABNORMAL HIGH (ref 0–99)
Triglycerides: 246 mg/dL — ABNORMAL HIGH (ref 0–149)
VLDL Cholesterol Cal: 46 mg/dL — ABNORMAL HIGH (ref 5–40)

## 2021-02-03 LAB — HEPATITIS C ANTIBODY: Hep C Virus Ab: <0.1 s/co ratio (ref 0.0–0.9)

## 2021-02-05 LAB — CYTOLOGY - PAP
Comment: NEGATIVE
Diagnosis: NEGATIVE
High risk HPV: NEGATIVE

## 2021-03-05 ENCOUNTER — Ambulatory Visit: Payer: Commercial Managed Care - PPO | Admitting: Family Medicine

## 2021-05-18 ENCOUNTER — Other Ambulatory Visit: Payer: Self-pay

## 2021-05-18 ENCOUNTER — Emergency Department
Admission: EM | Admit: 2021-05-18 | Discharge: 2021-05-18 | Disposition: A | Discharge status: Home / Self Care | Payer: Commercial Managed Care - PPO | Attending: Emergency Medicine | Admitting: Emergency Medicine

## 2021-05-18 DIAGNOSIS — T7840XA Allergy, unspecified, initial encounter: Secondary | ICD-10-CM

## 2021-05-18 DIAGNOSIS — R0602 Shortness of breath: Secondary | ICD-10-CM | POA: Diagnosis present

## 2021-05-18 MED ORDER — SUMATRIPTAN SUCCINATE 100 MG PO TABS: ORAL_TABLET | ORAL | 0 refills | Status: DC

## 2021-05-18 MED ORDER — EPINEPHRINE 0.3 MG/0.3ML IJ SOAJ
INTRAMUSCULAR | Status: AC
  Administered 2021-05-18: 0.3 mg via INTRAMUSCULAR
  Filled 2021-05-18: qty 0.3

## 2021-05-18 MED ORDER — DIPHENHYDRAMINE HCL 50 MG/ML IJ SOLN
25.0000 mg | Freq: Once | INTRAMUSCULAR | Status: AC
  Administered 2021-05-18: 25 mg via INTRAVENOUS
  Filled 2021-05-18: qty 1

## 2021-05-18 MED ORDER — FAMOTIDINE 40 MG PO TABS: 40.0000 mg | ORAL_TABLET | Freq: Every evening | ORAL | 1 refills | Status: DC

## 2021-05-18 MED ORDER — PREDNISONE 10 MG (21) PO TBPK: ORAL_TABLET | ORAL | 0 refills | Status: DC

## 2021-05-18 MED ORDER — EPINEPHRINE 0.3 MG/0.3ML IJ SOAJ: 0.3000 mg | Freq: Once | INTRAMUSCULAR | Status: AC

## 2021-05-18 MED ORDER — FAMOTIDINE IN NACL 20-0.9 MG/50ML-% IV SOLN
20.0000 mg | Freq: Once | INTRAVENOUS | Status: AC
  Administered 2021-05-18: 20 mg via INTRAVENOUS
  Filled 2021-05-18: qty 50

## 2021-05-18 MED ORDER — SODIUM CHLORIDE 0.9 % IV BOLUS
1000.0000 mL | Freq: Once | INTRAVENOUS | Status: AC
  Administered 2021-05-18: 1000 mL via INTRAVENOUS

## 2021-05-18 MED ORDER — METHYLPREDNISOLONE SODIUM SUCC 125 MG IJ SOLR
125.0000 mg | Freq: Once | INTRAMUSCULAR | Status: AC
  Administered 2021-05-18: 125 mg via INTRAVENOUS
  Filled 2021-05-18: qty 2

## 2021-05-18 NOTE — ED Provider Notes (Signed)
[facility] Emergency Department Provider Note  ____________________________________________   Event Date/Time   First MD Initiated Contact with Patient 05/18/21 1846     (approximate)  I have reviewed the triage vital signs and the nursing notes.   HISTORY  Chief Complaint Allergic Reaction    HPI Andrea Ellis is a 35 y.o. female presents emergency department after she gave herself a vitamin injection which was ordered on the internet.  She injected the medication 25 minutes prior to arrival.  She states she began to flush and felt really hot.  She has hives.  Felt like her throat was swelling but does not have any difficulty breathing.  States she was using the vitamins to help with headaches.  She took this medication previously without any side effects.  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 Authorizing Provider  hydrocortisone (ANUSOL-HC) 25 MG suppository Place 1 suppository (25 mg total) rectally 2 (two) times daily. 02/02/21   Johnson, Megan P, DO  omeprazole (PRILOSEC) 20 MG capsule Take 1 capsule (20 mg total) by mouth daily. 02/02/21   Dorcas Carrow, DO    Allergies Patient has no known allergies.  Family History  Problem Relation Age of Onset   Alcohol abuse Father    Lung disease Mother      Social History Social History   Tobacco Use   Smoking status: Never   Smokeless tobacco: Never  Substance Use Topics   Alcohol use: No    Alcohol/week: 0.0 standard drinks   Drug use: No    Review of Systems  Constitutional: No fever/chills Eyes: No visual changes. ENT: No sore throat. Respiratory: Denies cough Cardiovascular: Denies chest pain Gastrointestinal: Denies abdominal pain Genitourinary: Negative for dysuria. Musculoskeletal: Negative for back pain. Skin: Positive or rash. Psychiatric: no mood changes,     ____________________________________________   PHYSICAL EXAM:  VITAL SIGNS: ED Triage Vitals  Enc Vitals Group     BP 05/18/21 1902 (!) 143/94     Pulse Rate 05/18/21 1902 94     Resp 05/18/21 1902 16     Temp 05/18/21 1902 98.3 F (36.8 C)     Temp Source 05/18/21 1902 Oral     SpO2 05/18/21 1902 100 %     Weight --      Height --      Head Circumference --      Peak Flow --      Pain Score 05/18/21 1845 8     Pain Loc --      Pain Edu? --      Excl. in GC? --     Constitutional: Alert and oriented. Well appearing and in no acute distress. Eyes: Conjunctivae are normal.  Head: Atraumatic. Nose: No congestion/rhinnorhea. Mouth/Throat: Mucous membranes are moist.  Patent airway, no  hives noted in the mouth, no swelling of the throat or lips Neck:  supple no lymphadenopathy noted Cardiovascular: Normal rate, regular rhythm. Heart sounds are normal Respiratory: Normal respiratory effort.  No retractions, lungs c t a  Abd: soft nontender bs normal all 4 quad GU: deferred Musculoskeletal: FROM all extremities, warm and well perfused Neurologic:  Normal speech and language.  Skin:  Skin is warm, dry and intact.  Hives noted along the face arms and abdomen. Psychiatric: Mood and affect are normal. Speech and behavior are normal.  ____________________________________________   LABS (all labs ordered are listed, but only abnormal results  are displayed)  Labs Reviewed - No data to display ____________________________________________   ____________________________________________  RADIOLOGY    ____________________________________________   PROCEDURES  Procedure(s) performed: No  .Critical Care E&M  Date/Time: 05/18/2021 7:40 PM Performed by: Faythe Ghee, PA-C  Critical care provider statement:    Critical care time (minutes):  35   Critical care time was exclusive of:  Separately billable procedures and treating other patients   Critical care was necessary to treat or prevent imminent or life-threatening deterioration of the following conditions:  Respiratory failure   Critical care was time spent personally by me on the following activities:  Development of treatment plan with patient or surrogate, evaluation of patient's response to treatment, examination of patient, obtaining history from patient or surrogate, ordering and performing treatments and interventions, pulse oximetry, re-evaluation of patient's condition and review of old charts After initial E/M assessment, critical care services were subsequently performed that were exclusive of separately billable procedures or treatment.   Comments:     Pt was having severe allergic reaction, required medications to reverse, monitoring      ____________________________________________   INITIAL IMPRESSION / ASSESSMENT AND PLAN / ED COURSE  Pertinent labs & imaging results that were available during my care of the patient were reviewed by me and considered in my medical decision making (see chart for details).   Patient is a 35 year old female presents emergency department with allergic reaction.  See HPI physical exam shows patient stable at this time  DDx: Acute allergic reaction, hives, anaphylaxis, medication reaction  Patient was given Solu-Medrol 125 mg IV, Benadryl 25 mg IV, Pepcid 20 mg IV, and 1 L normal saline, EpiPen is at  bedside  Recheck of patient shows she is feeling better, hives have decreased and redness has decreased  Pt will have rx for sterapred, pepcid, and migraine medication so that she will not continue to try internet medications   Andrea Ellis was evaluated in Emergency Department on 05/18/2021 for the symptoms described in the history of present illness. She was evaluated in the context of the global COVID-19 pandemic, which necessitated consideration that the patient might be at risk for infection with the SARS-CoV-2 virus that causes COVID-19. Institutional protocols and algorithms that pertain to the evaluation of patients at risk for COVID-19 are in a state of rapid change based on information released by regulatory bodies including the CDC and federal and state organizations. These policies and algorithms were followed during the patient's care in the ED.    As part of my medical decision making, I reviewed the following data within the electronic MEDICAL RECORD NUMBER History obtained from family, Nursing notes reviewed and incorporated, EKG interpreted tachycardia, Old chart reviewed, Notes from prior ED visits, and [place] Controlled Substance Database  ____________________________________________   FINAL CLINICAL IMPRESSION(S) / ED DIAGNOSES  Final diagnoses:  Acute allergic reaction, initial  encounter      NEW MEDICATIONS STARTED DURING THIS VISIT:  New Prescriptions   No medications on file     Note:  This document was prepared using Dragon voice recognition software and may include unintentional dictation errors.    Faythe Ghee, PA-C 05/18/21 1944    Phineas Semen, MD 05/18/21 2016

## 2021-05-18 NOTE — ED Notes (Signed)
Pt calm , collective , denied pain or sob. Pt had no signs of respiratory distress or signs/symptoms of an allergic reaction .

## 2021-05-18 NOTE — ED Triage Notes (Signed)
Pt to ED pov received vicodin injection 20 mins ago.  Now having hives all over body, face red RR even and unlabored States had this med before with no rxn  Epi pen given russel rn 1847

## 2021-05-18 NOTE — ED Notes (Signed)
Pt stated she took Providoce (vit  B1, B6 , B12 ) injection , took med aprox prior to arrival

## 2021-08-30 ENCOUNTER — Encounter: Payer: Self-pay | Admitting: Family Medicine

## 2021-08-30 ENCOUNTER — Ambulatory Visit: Payer: Commercial Managed Care - PPO | Admitting: Family Medicine

## 2021-08-30 ENCOUNTER — Other Ambulatory Visit: Payer: Self-pay

## 2021-08-30 VITALS — BP 116/78 | HR 75 | Temp 98.1°F | Wt 241.4 lb

## 2021-08-30 DIAGNOSIS — J189 Pneumonia, unspecified organism: Secondary | ICD-10-CM | POA: Diagnosis not present

## 2021-08-30 MED ORDER — DOXYCYCLINE HYCLATE 100 MG PO TABS: 100.0000 mg | ORAL_TABLET | Freq: Two times a day (BID) | ORAL | 0 refills | Status: DC

## 2021-08-30 MED ORDER — ALBUTEROL SULFATE HFA 108 (90 BASE) MCG/ACT IN AERS: 2.0000 | INHALATION_SPRAY | Freq: Four times a day (QID) | RESPIRATORY_TRACT | 0 refills | Status: DC | PRN

## 2021-08-30 NOTE — Progress Notes (Signed)
BP 116/78   Pulse 75   Temp 98.1 F (36.7 C)   Wt 241 lb 6.4 oz (109.5 kg)   SpO2 99%   BMI 40.80 kg/m    Subjective:    Patient ID: Andrea Ellis, female    DOB: [birth year], 35 y.o.   MRN: [id number]  HPI: Andrea Ellis is a 35 y.o. female  Chief Complaint  Patient presents with   Cough    Patient states she has had cough for about 2 weeks, she states she also hears wheezing in her chest. Patient has tried otc robitussin and nyquil but does not help.    UPPER RESPIRATORY TRACT INFECTION Duration: 2 weeks Worst symptom: cough and tighness Fever: no Cough: yes Shortness of breath: no Wheezing: yes Chest pain: no Chest tightness: yes Chest congestion: yes Nasal congestion: no Runny nose: no Post nasal drip: no Sneezing: no Sore throat: no Swollen glands: no Sinus pressure: no Headache: yes Face pain: no Toothache: no Ear pain: no  Ear pressure: no  Eyes red/itching:no Eye drainage/crusting: no  Vomiting: no Rash: no Fatigue: yes Sick contacts: yes Strep contacts: no  Context: stable Recurrent sinusitis: no Relief with OTC cold/cough medications: no  Treatments attempted: cold/sinus, mucinex, and cough syrup   Relevant past medical, surgical, family and social history reviewed and updated as indicated. Interim medical history since our last visit reviewed. Allergies and medications reviewed and updated.  Review of Systems  Constitutional:  Positive for fatigue and fever. Negative for activity change, appetite change, chills, diaphoresis and unexpected weight change.  HENT: Negative.    Respiratory:  Positive for cough, chest tightness and wheezing. Negative for apnea, choking, shortness of breath and stridor.   Cardiovascular: Negative.   Gastrointestinal: Negative.   Musculoskeletal: Negative.   Psychiatric/Behavioral: Negative.     Per HPI unless specifically indicated above     Objective:    BP 116/78   Pulse 75   Temp 98.1 F (36.7  C)   Wt 241 lb 6.4 oz (109.5 kg)   SpO2 99%   BMI 40.80 kg/m   Wt Readings from Last 3 Encounters:  08/30/21 241 lb 6.4 oz (109.5 kg)  02/02/21 238 lb (108 kg)  05/26/20 (!) 221 lb 6.4 oz (100.4 kg)    Physical Exam Vitals and nursing note reviewed.  Constitutional:      General: She is not in acute distress.    Appearance: Normal appearance. She is not ill-appearing, toxic-appearing or diaphoretic.  HENT:     Head: Normocephalic and atraumatic.     Right Ear: External ear normal.     Left Ear: External ear normal.     Nose: Nose normal.     Mouth/Throat:     Mouth: Mucous membranes are moist.     Pharynx: Oropharynx is clear.  Eyes:     General: No scleral icterus.       Right eye: No discharge.        Left eye: No discharge.     Extraocular Movements: Extraocular movements intact.     Conjunctiva/sclera: Conjunctivae normal.     Pupils: Pupils are equal, round, and reactive to light.  Cardiovascular:     Rate and Rhythm: Normal rate and regular rhythm.     Pulses: Normal pulses.     Heart sounds: Normal heart sounds. No murmur heard.   No friction rub. No gallop.  Pulmonary:     Effort: Pulmonary effort is normal. No respiratory distress.  Breath sounds: Normal breath sounds. No stridor. No wheezing, rhonchi or rales.  Chest:     Chest wall: No tenderness.  Musculoskeletal:        General: Normal range of motion.     Cervical back: Normal range of motion and neck supple.  Skin:    General: Skin is warm and dry.     Capillary Refill: Capillary refill takes less than 2 seconds.     Coloration: Skin is not jaundiced or pale.     Findings: No bruising, erythema, lesion or rash.  Neurological:     General: No focal deficit present.     Mental Status: She is alert and oriented to person, place, and time. Mental status is at baseline.  Psychiatric:        Mood and Affect: Mood normal.        Behavior: Behavior normal.        Thought Content: Thought content  normal.        Judgment: Judgment normal.    Results for orders placed or performed in visit on 02/02/21  Microscopic Examination   Urine  Result Value Ref Range   WBC, UA 0-5 0 - 5 /hpf   RBC None seen 0 - 2 /hpf   Epithelial Cells (non renal) 0-10 0 - 10 /hpf   Bacteria, UA Few (A) None seen/Few  CBC with Differential/Platelet  Result Value Ref Range   WBC 7.7 3.4 - 10.8 x10E3/uL   RBC 5.22 3.77 - 5.28 x10E6/uL   Hemoglobin 14.9 11.1 - 15.9 g/dL   Hematocrit 44.9 34.0 - 46.6 %   MCV 86 79 - 97 fL   MCH 28.5 26.6 - 33.0 pg   MCHC 33.2 31.5 - 35.7 g/dL   RDW 13.2 11.7 - 15.4 %   Platelets 341 150 - 450 x10E3/uL   Neutrophils 48 Not Estab. %   Lymphs 44 Not Estab. %   Monocytes 6 Not Estab. %   Eos 1 Not Estab. %   Basos 1 Not Estab. %   Neutrophils Absolute 3.7 1.4 - 7.0 x10E3/uL   Lymphocytes Absolute 3.3 (H) 0.7 - 3.1 x10E3/uL   Monocytes Absolute 0.5 0.1 - 0.9 x10E3/uL   EOS (ABSOLUTE) 0.1 0.0 - 0.4 x10E3/uL   Basophils Absolute 0.0 0.0 - 0.2 x10E3/uL   Immature Granulocytes 0 Not Estab. %   Immature Grans (Abs) 0.0 0.0 - 0.1 x10E3/uL  Comprehensive metabolic panel  Result Value Ref Range   Glucose 90 65 - 99 mg/dL   BUN 9 6 - 20 mg/dL   Creatinine, Ser 0.74 0.57 - 1.00 mg/dL   eGFR 109 >59 mL/min/1.73   BUN/Creatinine Ratio 12 9 - 23   Sodium 139 134 - 144 mmol/L   Potassium 4.2 3.5 - 5.2 mmol/L   Chloride 100 96 - 106 mmol/L   CO2 21 20 - 29 mmol/L   Calcium 9.7 8.7 - 10.2 mg/dL   Total Protein 7.8 6.0 - 8.5 g/dL   Albumin 4.8 3.8 - 4.8 g/dL   Globulin, Total 3.0 1.5 - 4.5 g/dL   Albumin/Globulin Ratio 1.6 1.2 - 2.2   Bilirubin Total 0.5 0.0 - 1.2 mg/dL   Alkaline Phosphatase 57 44 - 121 IU/L   AST 21 0 - 40 IU/L   ALT 28 0 - 32 IU/L  Lipid Panel w/o Chol/HDL Ratio  Result Value Ref Range   Cholesterol, Total 232 (H) 100 - 199 mg/dL   Triglycerides 246 (H) 0 - 149  mg/dL   HDL 30 (L) >39 mg/dL   VLDL Cholesterol Cal 46 (H) 5 - 40 mg/dL   LDL Chol Calc  (NIH) 156 (H) 0 - 99 mg/dL  Urinalysis, Routine w reflex microscopic  Result Value Ref Range   Specific Gravity, UA <1.005 (L) 1.005 - 1.030   pH, UA 6.0 5.0 - 7.5   Color, UA Yellow Yellow   Appearance Ur Cloudy (A) Clear   Leukocytes,UA Trace (A) Negative   Protein,UA Negative Negative/Trace   Glucose, UA Negative Negative   Ketones, UA Negative Negative   RBC, UA Trace (A) Negative   Bilirubin, UA Negative Negative   Urobilinogen, Ur 0.2 0.2 - 1.0 mg/dL   Nitrite, UA Negative Negative   Microscopic Examination See below:   TSH  Result Value Ref Range   TSH 3.940 0.450 - 4.500 uIU/mL  Hepatitis C Antibody  Result Value Ref Range   Hep C Virus Ab <0.1 0.0 - 0.9 s/co ratio  Cytology - PAP  Result Value Ref Range   High risk HPV Negative    Adequacy      Satisfactory for evaluation; transformation zone component PRESENT.   Diagnosis      - Negative for intraepithelial lesion or malignancy (NILM)   Comment Normal Reference Range HPV - Negative       Assessment & Plan:   Problem List Items Addressed This Visit   None Visit Diagnoses     Pneumonia of left upper lobe due to infectious organism    -  Primary   Will treat with doxycycline. Recheck 2 weeks to confirm resolution. Call with any concerns.    Relevant Medications   doxycycline (VIBRA-TABS) 100 MG tablet   albuterol (VENTOLIN HFA) 108 (90 Base) MCG/ACT inhaler        Follow up plan: Return in about 2 weeks (around 09/13/2021).

## 2021-09-13 ENCOUNTER — Ambulatory Visit: Payer: Commercial Managed Care - PPO | Admitting: Family Medicine

## 2021-09-14 ENCOUNTER — Encounter: Payer: Self-pay | Admitting: Family Medicine

## 2021-09-14 ENCOUNTER — Ambulatory Visit: Payer: Commercial Managed Care - PPO | Admitting: Family Medicine

## 2021-09-14 ENCOUNTER — Other Ambulatory Visit: Payer: Self-pay

## 2021-09-14 VITALS — BP 121/79 | HR 71 | Temp 98.4°F | Wt 240.8 lb

## 2021-09-14 DIAGNOSIS — R0789 Other chest pain: Secondary | ICD-10-CM | POA: Diagnosis not present

## 2021-09-14 DIAGNOSIS — J189 Pneumonia, unspecified organism: Secondary | ICD-10-CM | POA: Diagnosis not present

## 2021-09-14 MED ORDER — CYCLOBENZAPRINE HCL 10 MG PO TABS: 10.0000 mg | ORAL_TABLET | Freq: Every day | ORAL | 3 refills | Status: DC

## 2021-09-14 NOTE — Progress Notes (Signed)
BP 121/79   Pulse 71   Temp 98.4 F (36.9 C) (Oral)   Wt 240 lb 12.8 oz (109.2 kg)   SpO2 99%   BMI 40.69 kg/m    Subjective:    Patient ID: Andrea Ellis, female    DOB: [birth year], 35 y.o.   MRN: [id number]  HPI: Andrea Ellis is a 35 y.o. female  Chief Complaint  Patient presents with   Pneumonia    2 week f/up   Andrea Ellis presents today for follow up on her breathing. She notes that she has been feeling well. No wheezing. No fevers. No SOB. She has been having some chest pain- worse with coughing. She is otherwise feeling well with no other concerns or complaints at this time.   Relevant past medical, surgical, family and social history reviewed and updated as indicated. Interim medical history since our last visit reviewed. Allergies and medications reviewed and updated.  Review of Systems  Constitutional: Negative.   Respiratory: Negative.    Cardiovascular: Negative.   Gastrointestinal: Negative.   Musculoskeletal: Negative.   Neurological: Negative.   Psychiatric/Behavioral: Negative.     Per HPI unless specifically indicated above     Objective:    BP 121/79   Pulse 71   Temp 98.4 F (36.9 C) (Oral)   Wt 240 lb 12.8 oz (109.2 kg)   SpO2 99%   BMI 40.69 kg/m   Wt Readings from Last 3 Encounters:  09/14/21 240 lb 12.8 oz (109.2 kg)  08/30/21 241 lb 6.4 oz (109.5 kg)  02/02/21 238 lb (108 kg)    Physical Exam Vitals and nursing note reviewed.  Constitutional:      General: She is not in acute distress.    Appearance: Normal appearance. She is not ill-appearing, toxic-appearing or diaphoretic.  HENT:     Head: Normocephalic and atraumatic.     Right Ear: External ear normal.     Left Ear: External ear normal.     Nose: Nose normal.     Mouth/Throat:     Mouth: Mucous membranes are moist.     Pharynx: Oropharynx is clear.  Eyes:     General: No scleral icterus.       Right eye: No discharge.        Left eye: No discharge.      Extraocular Movements: Extraocular movements intact.     Conjunctiva/sclera: Conjunctivae normal.     Pupils: Pupils are equal, round, and reactive to light.  Cardiovascular:     Rate and Rhythm: Normal rate and regular rhythm.     Pulses: Normal pulses.     Heart sounds: Normal heart sounds. No murmur heard.   No friction rub. No gallop.  Pulmonary:     Effort: Pulmonary effort is normal. No respiratory distress.     Breath sounds: Normal breath sounds. No stridor. No wheezing, rhonchi or rales.  Chest:     Chest wall: No tenderness.  Musculoskeletal:        General: Normal range of motion.     Cervical back: Normal range of motion and neck supple.  Skin:    General: Skin is warm and dry.     Capillary Refill: Capillary refill takes less than 2 seconds.     Coloration: Skin is not jaundiced or pale.     Findings: No bruising, erythema, lesion or rash.  Neurological:     General: No focal deficit present.     Mental Status: She is alert and  oriented to person, place, and time. Mental status is at baseline.  Psychiatric:        Mood and Affect: Mood normal.        Behavior: Behavior normal.        Thought Content: Thought content normal.        Judgment: Judgment normal.    Results for orders placed or performed in visit on 02/02/21  Microscopic Examination   Urine  Result Value Ref Range   WBC, UA 0-5 0 - 5 /hpf   RBC None seen 0 - 2 /hpf   Epithelial Cells (non renal) 0-10 0 - 10 /hpf   Bacteria, UA Few (A) None seen/Few  CBC with Differential/Platelet  Result Value Ref Range   WBC 7.7 3.4 - 10.8 x10E3/uL   RBC 5.22 3.77 - 5.28 x10E6/uL   Hemoglobin 14.9 11.1 - 15.9 g/dL   Hematocrit 44.9 34.0 - 46.6 %   MCV 86 79 - 97 fL   MCH 28.5 26.6 - 33.0 pg   MCHC 33.2 31.5 - 35.7 g/dL   RDW 13.2 11.7 - 15.4 %   Platelets 341 150 - 450 x10E3/uL   Neutrophils 48 Not Estab. %   Lymphs 44 Not Estab. %   Monocytes 6 Not Estab. %   Eos 1 Not Estab. %   Basos 1 Not Estab. %    Neutrophils Absolute 3.7 1.4 - 7.0 x10E3/uL   Lymphocytes Absolute 3.3 (H) 0.7 - 3.1 x10E3/uL   Monocytes Absolute 0.5 0.1 - 0.9 x10E3/uL   EOS (ABSOLUTE) 0.1 0.0 - 0.4 x10E3/uL   Basophils Absolute 0.0 0.0 - 0.2 x10E3/uL   Immature Granulocytes 0 Not Estab. %   Immature Grans (Abs) 0.0 0.0 - 0.1 x10E3/uL  Comprehensive metabolic panel  Result Value Ref Range   Glucose 90 65 - 99 mg/dL   BUN 9 6 - 20 mg/dL   Creatinine, Ser 0.74 0.57 - 1.00 mg/dL   eGFR 109 >59 mL/min/1.73   BUN/Creatinine Ratio 12 9 - 23   Sodium 139 134 - 144 mmol/L   Potassium 4.2 3.5 - 5.2 mmol/L   Chloride 100 96 - 106 mmol/L   CO2 21 20 - 29 mmol/L   Calcium 9.7 8.7 - 10.2 mg/dL   Total Protein 7.8 6.0 - 8.5 g/dL   Albumin 4.8 3.8 - 4.8 g/dL   Globulin, Total 3.0 1.5 - 4.5 g/dL   Albumin/Globulin Ratio 1.6 1.2 - 2.2   Bilirubin Total 0.5 0.0 - 1.2 mg/dL   Alkaline Phosphatase 57 44 - 121 IU/L   AST 21 0 - 40 IU/L   ALT 28 0 - 32 IU/L  Lipid Panel w/o Chol/HDL Ratio  Result Value Ref Range   Cholesterol, Total 232 (H) 100 - 199 mg/dL   Triglycerides 246 (H) 0 - 149 mg/dL   HDL 30 (L) >39 mg/dL   VLDL Cholesterol Cal 46 (H) 5 - 40 mg/dL   LDL Chol Calc (NIH) 156 (H) 0 - 99 mg/dL  Urinalysis, Routine w reflex microscopic  Result Value Ref Range   Specific Gravity, UA <1.005 (L) 1.005 - 1.030   pH, UA 6.0 5.0 - 7.5   Color, UA Yellow Yellow   Appearance Ur Cloudy (A) Clear   Leukocytes,UA Trace (A) Negative   Protein,UA Negative Negative/Trace   Glucose, UA Negative Negative   Ketones, UA Negative Negative   RBC, UA Trace (A) Negative   Bilirubin, UA Negative Negative   Urobilinogen, Ur 0.2 0.2 -  1.0 mg/dL   Nitrite, UA Negative Negative   Microscopic Examination See below:   TSH  Result Value Ref Range   TSH 3.940 0.450 - 4.500 uIU/mL  Hepatitis C Antibody  Result Value Ref Range   Hep C Virus Ab <0.1 0.0 - 0.9 s/co ratio  Cytology - PAP  Result Value Ref Range   High risk HPV Negative     Adequacy      Satisfactory for evaluation; transformation zone component PRESENT.   Diagnosis      - Negative for intraepithelial lesion or malignancy (NILM)   Comment Normal Reference Range HPV - Negative       Assessment & Plan:   Problem List Items Addressed This Visit   None Visit Diagnoses     Pneumonia of left upper lobe due to infectious organism    -  Primary   Resolved. Lungs clear. Call with any concerns. Continue to monitor.    Chest wall pain       Will start flexeril and recheck 1 month. Call wiht any concerns or if not getting better. Continue to monitor.         Follow up plan: Return in about 4 weeks (around 10/12/2021).

## 2021-09-16 ENCOUNTER — Encounter: Payer: Self-pay | Admitting: Family Medicine

## 2022-02-10 ENCOUNTER — Encounter: Payer: Self-pay | Admitting: Family Medicine

## 2022-02-10 ENCOUNTER — Ambulatory Visit: Payer: Commercial Managed Care - PPO | Admitting: Family Medicine

## 2022-02-10 VITALS — BP 118/80 | HR 84 | Temp 98.3°F | Wt 243.8 lb

## 2022-02-10 DIAGNOSIS — J069 Acute upper respiratory infection, unspecified: Secondary | ICD-10-CM

## 2022-02-10 MED ORDER — PREDNISONE 50 MG PO TABS: 50.0000 mg | ORAL_TABLET | Freq: Every day | ORAL | 0 refills | Status: DC

## 2022-02-10 NOTE — Progress Notes (Signed)
? ?BP 118/80   Pulse 84   Temp 98.3 ?F (36.8 ?C) (Oral)   Wt 243 lb 12.8 oz (110.6 kg)   LMP 11/12/2021 (Within Months)   SpO2 98%   BMI 41.20 kg/m?   ? ?Subjective:  ? ? Patient ID: Andrea Ellis, female    DOB: 1986-08-20, 36 y.o.   MRN: [id number] ? ?HPI: ?Andrea Ellis is a 36 y.o. female ? ?Chief Complaint  ?Patient presents with  ? Cough  ?  X 1 week. States noticed some whistling noise in chest when taking deep breaths , and when laying down.   ? ?UPPER RESPIRATORY TRACT INFECTION ?Worst symptom: ?Fever: no ?Cough: yes ?Shortness of breath: no ?Wheezing: yes ?Chest pain: no ?Chest tightness: no ?Chest congestion: no ?Nasal congestion: no ?Runny nose: no ?Post nasal drip: no ?Sneezing: no ?Sore throat: no ?Swollen glands: no ?Sinus pressure: no ?Headache: yes ?Face pain: no ?Toothache: no ?Ear pain: no  ?Ear pressure: yes  ?Eyes red/itching:no ?Eye drainage/crusting: no  ?Vomiting: no ?Rash: no ?Fatigue: yes ?Sick contacts: no ?Strep contacts: no  ?Context: better ?Recurrent sinusitis: no ?Relief with OTC cold/cough medications: no  ?Treatments attempted: none  ? ? ?Relevant past medical, surgical, family and social history reviewed and updated as indicated. Interim medical history since our last visit reviewed. ?Allergies and medications reviewed and updated. ? ?Review of Systems  ?Constitutional:  Positive for fatigue. Negative for activity change, appetite change, chills, diaphoresis, fever and unexpected weight change.  ?HENT: Negative.    ?Respiratory:  Positive for cough and wheezing. Negative for apnea, choking, chest tightness, shortness of breath and stridor.   ?Cardiovascular: Negative.   ?Gastrointestinal: Negative.   ?Musculoskeletal: Negative.   ?Psychiatric/Behavioral: Negative.    ? ?Per HPI unless specifically indicated above ? ?   ?Objective:  ?  ?BP 118/80   Pulse 84   Temp 98.3 ?F (36.8 ?C) (Oral)   Wt 243 lb 12.8 oz (110.6 kg)   LMP 11/12/2021 (Within Months)   SpO2 98%    BMI 41.20 kg/m?   ?Wt Readings from Last 3 Encounters:  ?02/10/22 243 lb 12.8 oz (110.6 kg)  ?09/14/21 240 lb 12.8 oz (109.2 kg)  ?08/30/21 241 lb 6.4 oz (109.5 kg)  ?  ?Physical Exam ?Vitals and nursing note reviewed.  ?Constitutional:   ?   General: She is not in acute distress. ?   Appearance: Normal appearance. She is not ill-appearing, toxic-appearing or diaphoretic.  ?HENT:  ?   Head: Normocephalic and atraumatic.  ?   Right Ear: Tympanic membrane, ear canal and external ear normal. There is no impacted cerumen.  ?   Left Ear: Tympanic membrane, ear canal and external ear normal. There is no impacted cerumen.  ?   Nose: Nose normal. No congestion or rhinorrhea.  ?   Mouth/Throat:  ?   Mouth: Mucous membranes are moist.  ?   Pharynx: Oropharynx is clear. No oropharyngeal exudate or posterior oropharyngeal erythema.  ?Eyes:  ?   General: No scleral icterus.    ?   Right eye: No discharge.     ?   Left eye: No discharge.  ?   Extraocular Movements: Extraocular movements intact.  ?   Conjunctiva/sclera: Conjunctivae normal.  ?   Pupils: Pupils are equal, round, and reactive to light.  ?Cardiovascular:  ?   Rate and Rhythm: Normal rate and regular rhythm.  ?   Pulses: Normal pulses.  ?   Heart sounds: Normal heart sounds. No murmur  heard. ?  No friction rub. No gallop.  ?Pulmonary:  ?   Effort: Pulmonary effort is normal. No respiratory distress.  ?   Breath sounds: Normal breath sounds. No stridor. No wheezing, rhonchi or rales.  ?Chest:  ?   Chest wall: No tenderness.  ?Musculoskeletal:     ?   General: Normal range of motion.  ?   Cervical back: Normal range of motion and neck supple.  ?Skin: ?   General: Skin is warm and dry.  ?   Capillary Refill: Capillary refill takes less than 2 seconds.  ?   Coloration: Skin is not jaundiced or pale.  ?   Findings: No bruising, erythema, lesion or rash.  ?Neurological:  ?   General: No focal deficit present.  ?   Mental Status: She is alert and oriented to person, place,  and time. Mental status is at baseline.  ?Psychiatric:     ?   Mood and Affect: Mood normal.     ?   Behavior: Behavior normal.     ?   Thought Content: Thought content normal.     ?   Judgment: Judgment normal.  ? ? ?Results for orders placed or performed in visit on 02/02/21  ?Microscopic Examination  ? Urine  ?Result Value Ref Range  ? WBC, UA 0-5 0 - 5 /hpf  ? RBC None seen 0 - 2 /hpf  ? Epithelial Cells (non renal) 0-10 0 - 10 /hpf  ? Bacteria, UA Few (A) None seen/Few  ?CBC with Differential/Platelet  ?Result Value Ref Range  ? WBC 7.7 3.4 - 10.8 x10E3/uL  ? RBC 5.22 3.77 - 5.28 x10E6/uL  ? Hemoglobin 14.9 11.1 - 15.9 g/dL  ? Hematocrit 44.9 34.0 - 46.6 %  ? MCV 86 79 - 97 fL  ? MCH 28.5 26.6 - 33.0 pg  ? MCHC 33.2 31.5 - 35.7 g/dL  ? RDW 13.2 11.7 - 15.4 %  ? Platelets 341 150 - 450 x10E3/uL  ? Neutrophils 48 Not Estab. %  ? Lymphs 44 Not Estab. %  ? Monocytes 6 Not Estab. %  ? Eos 1 Not Estab. %  ? Basos 1 Not Estab. %  ? Neutrophils Absolute 3.7 1.4 - 7.0 x10E3/uL  ? Lymphocytes Absolute 3.3 (H) 0.7 - 3.1 x10E3/uL  ? Monocytes Absolute 0.5 0.1 - 0.9 x10E3/uL  ? EOS (ABSOLUTE) 0.1 0.0 - 0.4 x10E3/uL  ? Basophils Absolute 0.0 0.0 - 0.2 x10E3/uL  ? Immature Granulocytes 0 Not Estab. %  ? Immature Grans (Abs) 0.0 0.0 - 0.1 x10E3/uL  ?Comprehensive metabolic panel  ?Result Value Ref Range  ? Glucose 90 65 - 99 mg/dL  ? BUN 9 6 - 20 mg/dL  ? Creatinine, Ser 0.74 0.57 - 1.00 mg/dL  ? eGFR 109 >59 mL/min/1.73  ? BUN/Creatinine Ratio 12 9 - 23  ? Sodium 139 134 - 144 mmol/L  ? Potassium 4.2 3.5 - 5.2 mmol/L  ? Chloride 100 96 - 106 mmol/L  ? CO2 21 20 - 29 mmol/L  ? Calcium 9.7 8.7 - 10.2 mg/dL  ? Total Protein 7.8 6.0 - 8.5 g/dL  ? Albumin 4.8 3.8 - 4.8 g/dL  ? Globulin, Total 3.0 1.5 - 4.5 g/dL  ? Albumin/Globulin Ratio 1.6 1.2 - 2.2  ? Bilirubin Total 0.5 0.0 - 1.2 mg/dL  ? Alkaline Phosphatase 57 44 - 121 IU/L  ? AST 21 0 - 40 IU/L  ? ALT 28 0 - 32 IU/L  ?Lipid Panel  w/o Chol/HDL Ratio  ?Result Value Ref Range   ? Cholesterol, Total 232 (H) 100 - 199 mg/dL  ? Triglycerides 246 (H) 0 - 149 mg/dL  ? HDL 30 (L) >39 mg/dL  ? VLDL Cholesterol Cal 46 (H) 5 - 40 mg/dL  ? LDL Chol Calc (NIH) 156 (H) 0 - 99 mg/dL  ?Urinalysis, Routine w reflex microscopic  ?Result Value Ref Range  ? Specific Gravity, UA <1.005 (L) 1.005 - 1.030  ? pH, UA 6.0 5.0 - 7.5  ? Color, UA Yellow Yellow  ? Appearance Ur Cloudy (A) Clear  ? Leukocytes,UA Trace (A) Negative  ? Protein,UA Negative Negative/Trace  ? Glucose, UA Negative Negative  ? Ketones, UA Negative Negative  ? RBC, UA Trace (A) Negative  ? Bilirubin, UA Negative Negative  ? Urobilinogen, Ur 0.2 0.2 - 1.0 mg/dL  ? Nitrite, UA Negative Negative  ? Microscopic Examination See below:   ?TSH  ?Result Value Ref Range  ? TSH 3.940 0.450 - 4.500 uIU/mL  ?Hepatitis C Antibody  ?Result Value Ref Range  ? Hep C Virus Ab <0.1 0.0 - 0.9 s/co ratio  ?Cytology - PAP  ?Result Value Ref Range  ? High risk HPV Negative   ? Adequacy    ?  Satisfactory for evaluation; transformation zone component PRESENT.  ? Diagnosis    ?  - Negative for intraepithelial lesion or malignancy (NILM)  ? Comment Normal Reference Range HPV - Negative   ? ?   ?Assessment & Plan:  ? ?Problem List Items Addressed This Visit   ?None ?Visit Diagnoses   ? ? Upper respiratory tract infection, unspecified type    -  Primary  ? Will treat with prednisone. Call if not getting better or getting worse.   ? ?  ?  ? ?Follow up plan: ?Return if symptoms worsen or fail to improve. ? ? ? ? ? ?

## 2023-05-05 ENCOUNTER — Emergency Department: Payer: BC Managed Care – PPO

## 2023-05-05 ENCOUNTER — Emergency Department
Admission: EM | Admit: 2023-05-05 | Discharge: 2023-05-05 | Disposition: A | Discharge status: Home / Self Care | Payer: BC Managed Care – PPO | Attending: Emergency Medicine | Admitting: Emergency Medicine

## 2023-05-05 ENCOUNTER — Other Ambulatory Visit: Payer: Self-pay

## 2023-05-05 DIAGNOSIS — W01198A Fall on same level from slipping, tripping and stumbling with subsequent striking against other object, initial encounter: Secondary | ICD-10-CM | POA: Insufficient documentation

## 2023-05-05 DIAGNOSIS — S0990XA Unspecified injury of head, initial encounter: Secondary | ICD-10-CM | POA: Diagnosis present

## 2023-05-05 DIAGNOSIS — S0003XA Contusion of scalp, initial encounter: Secondary | ICD-10-CM | POA: Insufficient documentation

## 2023-05-05 DIAGNOSIS — Y92828 Other wilderness area as the place of occurrence of the external cause: Secondary | ICD-10-CM | POA: Diagnosis not present

## 2023-05-05 MED ORDER — KETOROLAC TROMETHAMINE 15 MG/ML IJ SOLN
15.0000 mg | Freq: Once | INTRAMUSCULAR | Status: AC
  Administered 2023-05-05: 15 mg via INTRAMUSCULAR
  Filled 2023-05-05: qty 1

## 2023-05-05 MED ORDER — ACETAMINOPHEN 500 MG PO TABS
1000.0000 mg | ORAL_TABLET | Freq: Once | ORAL | Status: AC
  Administered 2023-05-05: 1000 mg via ORAL
  Filled 2023-05-05: qty 2

## 2023-05-05 MED ORDER — CYCLOBENZAPRINE HCL 10 MG PO TABS
5.0000 mg | ORAL_TABLET | Freq: Once | ORAL | Status: AC
  Administered 2023-05-05: 5 mg via ORAL
  Filled 2023-05-05: qty 1

## 2023-05-05 MED ORDER — KETOROLAC TROMETHAMINE 10 MG PO TABS: 10.0000 mg | ORAL_TABLET | Freq: Three times a day (TID) | ORAL | 0 refills | Status: DC | PRN

## 2023-05-05 NOTE — ED Triage Notes (Signed)
Pt sts that [place] fun on the river and [place] and fell and hit her head on the rocks. Pt sts that she had trouble staying awake right after it happened like she was going to pass out. PT is A/Ox4 at this time, VS WNL, NAD

## 2023-05-05 NOTE — ED Notes (Addendum)
Pt had fall today; pt had fall and hit posterior head on rock; denies LOC; pt reports from back of head to face around both jaws hurts. Pt alert, speech clear, pain 5/10 in head, skin dry and resp reg/unlabored. Pt reports pressure in head and higher pain on left side of head/neck. Denies numbness or tingling in hands and feet. Denies blurred vision. A&Ox4. Denies nausea currently. Reports mildly sensitive to light and sound. Redness at posterior head where pt reports she is tender from fall. Movement forward causes pt inc pain. Lights dimmed for pt. Visitor remains at bedside. Pt placed on monitor.

## 2023-05-05 NOTE — ED Provider Notes (Signed)
[facility] Emergency Department Provider Note     Event Date/Time   First MD Initiated Contact with Patient 05/05/23 1649     (approximate)   History   Head Injury   HPI  Andrea Ellis is a 37 y.o. female no significant past medical history presents to the ED for evaluation of a head injury earlier today.  Patient reports she was on [place] river and slipped and fell on a rock hitting her head. Pain score 9/10. Denies loss of consciousness and vomiting.  Patient reports moderate headache and stiffness in her neck with lateral rotation to both sides.  Patient denies syncopal episode, chest pain and shortness of breath.    Physical Exam   Triage Vital Signs: ED Triage Vitals [05/05/23 1603]  Enc Vitals Group     BP (!) 142/93     Pulse Rate 85     Resp 20     Temp 98.8 F (37.1 C)     Temp Source Oral     SpO2 96 %     Weight 240 lb (108.9 kg)     Height 5\' 4"  (1.626 m)     Head Circumference      Peak Flow      Pain Score      Pain Loc      Pain Edu?      Excl. in GC?     Most recent vital signs: Vitals:   05/05/23 1830 05/05/23 1841  BP:  118/79  Pulse: 77   Resp:  18  Temp:    SpO2: 97%    General: Alert and oriented. INAD.  Skin:  Warm, dry and intact.      Head:  NCAT.  Scalp is tender over mid occipital lobe.  Flat, small erythremic bruise noted. Eyes:  PERRLA. EOMI.  Ears:  EACs patent.  No bleeding or discharge noted no postauricular ecchymosis. Neck:   No cervical spine tenderness to palpation.  AROM limited due to pain with lateral rotation. CV:  Good peripheral perfusion. RRR.   RESP:  Normal effort. LCTAB.  MSK:   Full ROM in all joints. No swelling, deformity or tenderness.  NEURO: Cranial nerves II-XII intact. No focal deficits. Sensation and motor function intact.  Grip strength is equal bilaterally.  Smooth coordination with finger-to-nose test.  Stance and gait is steady.  ED Results / Procedures / Treatments    Labs (all labs ordered are listed, but only abnormal results are displayed) Labs Reviewed - No data to display  RADIOLOGY  I personally viewed and evaluated these images as part of my medical decision making, as well as reviewing the written report by the radiologist.  ED Provider Interpretation: CT head and CT cervical spine are unremarkable   CT Head Wo Contrast  Result Date: 05/05/2023 CLINICAL DATA:  Fall. EXAM: CT HEAD WITHOUT CONTRAST CT CERVICAL SPINE WITHOUT CONTRAST TECHNIQUE: Multidetector CT imaging of the head and cervical spine was performed following the standard protocol without intravenous contrast. Multiplanar CT image reconstructions of the cervical spine were also generated. RADIATION DOSE REDUCTION: This exam was performed according to the departmental dose-optimization program which includes automated exposure control, adjustment of the mA and/or kV according to patient size and/or use of iterative reconstruction technique. COMPARISON:  None Available. FINDINGS: CT HEAD FINDINGS Brain: No acute intracranial hemorrhage. No focal mass lesion. No CT evidence of acute infarction. No midline shift or mass effect. No hydrocephalus. Basilar cisterns are patent. Vascular:  No hyperdense vessel or unexpected calcification. Skull: Normal. Negative for fracture or focal lesion. Sinuses/Orbits: Paranasal sinuses and mastoid air cells are clear. Orbits are clear. Other: None. CT CERVICAL SPINE FINDINGS Alignment: Normal alignment of the cervical vertebral bodies. Skull base and vertebrae: Normal craniocervical junction. No loss of vertebral body height or disc height. Normal facet articulation. No evidence of fracture. Soft tissues and spinal canal: No prevertebral soft tissue swelling. No perispinal or epidural hematoma. Disc levels:  Unremarkable Upper chest: Clear Other: None IMPRESSION: HEAD CT: No acute intracranial findings. CERVICAL SPINE CT: No cervical spine fracture. Electronically  Signed   By: Genevive Bi M.D.   On: 05/05/2023 16:46   CT Cervical Spine Wo Contrast  Result Date: 05/05/2023 CLINICAL DATA:  Fall. EXAM: CT HEAD WITHOUT CONTRAST CT CERVICAL SPINE WITHOUT CONTRAST TECHNIQUE: Multidetector CT imaging of the head and cervical spine was performed following the standard protocol without intravenous contrast. Multiplanar CT image reconstructions of the cervical spine were also generated. RADIATION DOSE REDUCTION: This exam was performed according to the departmental dose-optimization program which includes automated exposure control, adjustment of the mA and/or kV according to patient size and/or use of iterative reconstruction technique. COMPARISON:  None Available. FINDINGS: CT HEAD FINDINGS Brain: No acute intracranial hemorrhage. No focal mass lesion. No CT evidence of acute infarction. No midline shift or mass effect. No hydrocephalus. Basilar cisterns are patent. Vascular: No hyperdense vessel or unexpected calcification. Skull: Normal. Negative for fracture or focal lesion. Sinuses/Orbits: Paranasal sinuses and mastoid air cells are clear. Orbits are clear. Other: None. CT CERVICAL SPINE FINDINGS Alignment: Normal alignment of the cervical vertebral bodies. Skull base and vertebrae: Normal craniocervical junction. No loss of vertebral body height or disc height. Normal facet articulation. No evidence of fracture. Soft tissues and spinal canal: No prevertebral soft tissue swelling. No perispinal or epidural hematoma. Disc levels:  Unremarkable Upper chest: Clear Other: None IMPRESSION: HEAD CT: No acute intracranial findings. CERVICAL SPINE CT: No cervical spine fracture. Electronically Signed   By: Genevive Bi M.D.   On: 05/05/2023 16:46    PROCEDURES:  Critical Care performed: No  Procedures  MEDICATIONS ORDERED IN ED: Medications  cyclobenzaprine (FLEXERIL) tablet 5 mg (5 mg Oral Given 05/05/23 1733)  acetaminophen (TYLENOL) tablet 1,000 mg (1,000 mg Oral  Given 05/05/23 1734)  ketorolac (TORADOL) 15 MG/ML injection 15 mg (15 mg Intramuscular Given 05/05/23 1731)    IMPRESSION / MDM / ASSESSMENT AND PLAN / ED COURSE  I reviewed the triage vital signs and the nursing notes.                               37 y.o. female presents to the emergency department for evaluation and treatment of a minor head injury earlier today. See HPI for further details.   Differential diagnosis includes, but is not limited to skull fracture, intracranial hemorrhage, cervical spinal injury, headache, migraine.  Physical exam findings are overall benign.  This is reassuring.  There are no signs of skull fracture including postauricular ecchymosis or raccoon eye sign.  CT head and CT cervical spine are both unremarkable.  Reassuring.  She is administered Flexeril, Toradol and Tylenol in ED with complete resolution of symptoms.  Patient is ready for discharge.  Patient is in stable condition and satisfactory condition for discharge and outpatient follow-up with her primary care physician in 1 week.  Patient will be discharged home with prescriptions for Toradol  for pain as needed.  Patient is given ED precautions to return to the ED for any worsening or new symptoms.  All questions and concerns were addressed during this ED visit.  Patient's presentation is most consistent with acute complicated illness / injury requiring diagnostic workup.  FINAL CLINICAL IMPRESSION(S) / ED DIAGNOSES   Final diagnoses:  Minor head injury without loss of consciousness, initial encounter   Rx / DC Orders   ED Discharge Orders          Ordered    ketorolac (TORADOL) 10 MG tablet  Every 8 hours PRN        05/05/23 1820             Note:  This document was prepared using Dragon voice recognition software and may include unintentional dictation errors.    Romeo Apple, Ustin Cruickshank A, PA-C 05/05/23 1934    Dionne Bucy, MD 05/06/23 Rich Fuchs

## 2023-05-08 ENCOUNTER — Telehealth: Payer: Self-pay

## 2023-05-08 NOTE — Transitions of Care (Post Inpatient/ED Visit) (Signed)
   05/08/2023  Name: Perl Esper MRN: [id number] DOB: 1986-04-24  Today's TOC FU Call Status: Today's TOC FU Call Status:: Successful TOC FU Call Competed TOC FU Call Complete Date: 05/08/23  Transition Care Management Follow-up Telephone Call Date of Discharge: 05/05/23 Discharge Facility: [facility] Type of Discharge: Emergency Department Reason for ED Visit: Other: How have you been since you were released from the hospital?: Better Any questions or concerns?: No  Items Reviewed: Did you receive and understand the discharge instructions provided?: Yes Medications obtained,verified, and reconciled?: Yes (Medications Reviewed) Any new allergies since your discharge?: No Dietary orders reviewed?: No Do you have support at home?: No  Medications Reviewed Today: Medications Reviewed Today     Reviewed by Pablo Ledger, CMA (Certified Medical Assistant) on 05/08/23 at 1156  Med List Status: <None>   Medication Order Taking? Sig Documenting Provider Last Dose Status Informant  ketorolac (TORADOL) 10 MG tablet [id number] Yes Take 1 tablet (10 mg total) by mouth every 8 (eight) hours as needed for moderate pain. Romeo Apple, Myah A, PA-C Taking Active             Home Care and Equipment/Supplies: Were Home Health Services Ordered?: No Any new equipment or medical supplies ordered?: No  Functional Questionnaire: Do you need assistance with bathing/showering or dressing?: No Do you need assistance with meal preparation?: No Do you need assistance with eating?: No Do you have difficulty maintaining continence: No Do you need assistance with getting out of bed/getting out of a chair/moving?: No Do you have difficulty managing or taking your medications?: No  Follow up appointments reviewed: PCP Follow-up appointment confirmed?: No MD Provider Line [phone] Given: No Specialist Hospital Follow-up appointment confirmed?: No Do you  need transportation to your follow-up appointment?: No Do you understand care options if your condition(s) worsen?: Yes-patient verbalized understanding    SIGNATURE: Wilhemena Durie, CMA

## 2023-06-01 ENCOUNTER — Encounter: Payer: Self-pay | Admitting: Family Medicine

## 2023-06-30 ENCOUNTER — Encounter: Payer: Commercial Managed Care - PPO | Admitting: Family Medicine

## 2023-07-18 ENCOUNTER — Encounter: Payer: Self-pay | Admitting: Family Medicine

## 2023-12-29 ENCOUNTER — Ambulatory Visit: Payer: Self-pay | Admitting: Family Medicine

## 2023-12-29 NOTE — Telephone Encounter (Signed)
 Communication  Red Word that prompted transfer to Nurse Triage: numbness and headaches     Answer Assessment - Initial Assessment Questions 1. SYMPTOM: "What is the main symptom you are concerned about?" (e.g., weakness, numbness)     Numbness 2. ONSET: "When did this start?" (minutes, hours, days; while sleeping)     10 years  3. LAST NORMAL: "When was the last time you (the patient) were normal (no symptoms)?"     Long time 4. PATTERN "Does this come and go, or has it been constant since it started?"  "Is it present now?"     Comes and goes 5. CARDIAC SYMPTOMS: "Have you had any of the following symptoms: chest pain, difficulty breathing, palpitations?"     No 6. NEUROLOGIC SYMPTOMS: "Have you had any of the following symptoms: headache, dizziness, vision loss, double vision, changes in speech, unsteady on your feet?"     Headaches 7. OTHER SYMPTOMS: "Do you have any other symptoms?"     Numbness left side at times 8. PREGNANCY: "Is there any chance you are pregnant?" "When was your last menstrual period?"     No  Protocols used: Neurologic Deficit-A-AH

## 2023-12-29 NOTE — Telephone Encounter (Signed)
    Chief Complaint: Numbness to left side of body that comes and goes x 10 years. Sometimes has headaches as well. Symptoms: Above, has not sought treatment. Frequency: 10 years Pertinent Negatives: Patient denies any numbness today Disposition: [] ED /[] Urgent Care (no appt availability in office) / [x] Appointment(In office/virtual)/ []  [facility]/ [] Home Care/ [] Refused Recommended Disposition /[]  Mobile Bus/ []  Follow-up with PCP Additional Notes: Instructed husband to take pt. To ED if numbness occurs again.   Reason for Disposition  [1] Weakness of arm / hand, or leg / foot AND [2] is a chronic symptom (recurrent or ongoing AND present > 4 weeks)  Protocols used: Neurologic Deficit-A-AH

## 2024-01-03 ENCOUNTER — Ambulatory Visit: Payer: BC Managed Care – PPO | Admitting: Nurse Practitioner

## 2024-01-03 VITALS — BP 114/77 | HR 73 | Ht 64.5 in | Wt 233.4 lb

## 2024-01-03 DIAGNOSIS — S0300XA Dislocation of jaw, unspecified side, initial encounter: Secondary | ICD-10-CM

## 2024-01-03 DIAGNOSIS — R519 Headache, unspecified: Secondary | ICD-10-CM | POA: Diagnosis not present

## 2024-01-03 DIAGNOSIS — M5442 Lumbago with sciatica, left side: Secondary | ICD-10-CM

## 2024-01-03 DIAGNOSIS — G8929 Other chronic pain: Secondary | ICD-10-CM

## 2024-01-03 MED ORDER — METHYLPREDNISOLONE 4 MG PO TBPK: ORAL_TABLET | ORAL | 0 refills | Status: DC

## 2024-01-03 NOTE — Progress Notes (Unsigned)
 BP 114/77 (BP Location: Right Arm, Patient Position: Sitting, Cuff Size: Large)   Pulse 73   Ht 5' 4.5" (1.638 m)   Wt 233 lb 6.4 oz (105.9 kg)   SpO2 98%   BMI 39.44 kg/m    Subjective:    Patient ID: Andrea Ellis, female    DOB: 11/22/1985, 38 y.o.   MRN: [id number]  HPI: Andrea Ellis is a 38 y.o. female  Chief Complaint  Patient presents with   Annual Exam   Migraine    Happens 2x a week   bloodwork    Would like to test thyroid    Patient states she has been having a lot of headaches- about 2 per week.  States this has been going on for several years.  She has tried taking tylenol, advil, excedrin.  She drinks about 80 ounces of water per day.  She drinks 2 cups of coffee per day.  Her headaches go away 20-30 mins after taking tylenol.  However, lately they are lasting longer than a day.  She has a recent eye exam and got new glasses.   Is concerned that the left side of her face is swollen and not the right side.  Does have an appt with a dentist in April.  Denies any tooth pain.  The side of her face is not painful.    Patient states she has been having back pain.  States she always has the back pain.  Has seen a chiropractor which helped for a little bit but then the pain came back.  The pain happens all the time, day or night.   She tried methocarbamol which made her groggy and feel weird the next day.     Relevant past medical, surgical, family and social history reviewed and updated as indicated. Interim medical history since our last visit reviewed. Allergies and medications reviewed and updated.  Review of Systems  Per HPI unless specifically indicated above     Objective:    BP 114/77 (BP Location: Right Arm, Patient Position: Sitting, Cuff Size: Large)   Pulse 73   Ht 5' 4.5" (1.638 m)   Wt 233 lb 6.4 oz (105.9 kg)   SpO2 98%   BMI 39.44 kg/m   Wt Readings from Last 3 Encounters:  01/03/24 233 lb 6.4 oz (105.9 kg)  05/05/23 240 lb (108.9 kg)   02/10/22 243 lb 12.8 oz (110.6 kg)    Physical Exam  Results for orders placed or performed in visit on 02/02/21  Cytology - PAP   Collection Time: 02/02/21  9:51 AM  Result Value Ref Range   High risk HPV Negative    Adequacy      Satisfactory for evaluation; transformation zone component PRESENT.   Diagnosis      - Negative for intraepithelial lesion or malignancy (NILM)   Comment Normal Reference Range HPV - Negative   Microscopic Examination   Collection Time: 02/02/21 10:34 AM   Urine  Result Value Ref Range   WBC, UA 0-5 0 - 5 /hpf   RBC, Urine None seen 0 - 2 /hpf   Epithelial Cells (non renal) 0-10 0 - 10 /hpf   Bacteria, UA Few (A) None seen/Few  Urinalysis, Routine w reflex microscopic   Collection Time: 02/02/21 10:34 AM  Result Value Ref Range   Specific Gravity, UA <1.005 (L) 1.005 - 1.030   pH, UA 6.0 5.0 - 7.5   Color, UA Yellow Yellow   Appearance Ur Cloudy (  A) Clear   Leukocytes,UA Trace (A) Negative   Protein,UA Negative Negative/Trace   Glucose, UA Negative Negative   Ketones, UA Negative Negative   RBC, UA Trace (A) Negative   Bilirubin, UA Negative Negative   Urobilinogen, Ur 0.2 0.2 - 1.0 mg/dL   Nitrite, UA Negative Negative   Microscopic Examination See below:   CBC with Differential/Platelet   Collection Time: 02/02/21 10:37 AM  Result Value Ref Range   WBC 7.7 3.4 - 10.8 x10E3/uL   RBC 5.22 3.77 - 5.28 x10E6/uL   Hemoglobin 14.9 11.1 - 15.9 g/dL   Hematocrit 16.1 09.6 - 46.6 %   MCV 86 79 - 97 fL   MCH 28.5 26.6 - 33.0 pg   MCHC 33.2 31.5 - 35.7 g/dL   RDW 04.5 40.9 - 81.1 %   Platelets 341 150 - 450 x10E3/uL   Neutrophils 48 Not Estab. %   Lymphs 44 Not Estab. %   Monocytes 6 Not Estab. %   Eos 1 Not Estab. %   Basos 1 Not Estab. %   Neutrophils Absolute 3.7 1.4 - 7.0 x10E3/uL   Lymphocytes Absolute 3.3 (H) 0.7 - 3.1 x10E3/uL   Monocytes Absolute 0.5 0.1 - 0.9 x10E3/uL   EOS (ABSOLUTE) 0.1 0.0 - 0.4 x10E3/uL   Basophils Absolute  0.0 0.0 - 0.2 x10E3/uL   Immature Granulocytes 0 Not Estab. %   Immature Grans (Abs) 0.0 0.0 - 0.1 x10E3/uL  Comprehensive metabolic panel   Collection Time: 02/02/21 10:37 AM  Result Value Ref Range   Glucose 90 65 - 99 mg/dL   BUN 9 6 - 20 mg/dL   Creatinine, Ser 9.14 0.57 - 1.00 mg/dL   eGFR 782 >95 AO/ZHY/8.65   BUN/Creatinine Ratio 12 9 - 23   Sodium 139 134 - 144 mmol/L   Potassium 4.2 3.5 - 5.2 mmol/L   Chloride 100 96 - 106 mmol/L   CO2 21 20 - 29 mmol/L   Calcium 9.7 8.7 - 10.2 mg/dL   Total Protein 7.8 6.0 - 8.5 g/dL   Albumin 4.8 3.8 - 4.8 g/dL   Globulin, Total 3.0 1.5 - 4.5 g/dL   Albumin/Globulin Ratio 1.6 1.2 - 2.2   Bilirubin Total 0.5 0.0 - 1.2 mg/dL   Alkaline Phosphatase 57 44 - 121 IU/L   AST 21 0 - 40 IU/L   ALT 28 0 - 32 IU/L  Lipid Panel w/o Chol/HDL Ratio   Collection Time: 02/02/21 10:37 AM  Result Value Ref Range   Cholesterol, Total 232 (H) 100 - 199 mg/dL   Triglycerides 784 (H) 0 - 149 mg/dL   HDL 30 (L) >69 mg/dL   VLDL Cholesterol Cal 46 (H) 5 - 40 mg/dL   LDL Chol Calc (NIH) 629 (H) 0 - 99 mg/dL  TSH   Collection Time: 02/02/21 10:37 AM  Result Value Ref Range   TSH 3.940 0.450 - 4.500 uIU/mL  Hepatitis C Antibody   Collection Time: 02/02/21 10:37 AM  Result Value Ref Range   Hep C Virus Ab <0.1 0.0 - 0.9 s/co ratio      Assessment & Plan:   Problem List Items Addressed This Visit   None    Follow up plan: No follow-ups on file.

## 2024-01-04 NOTE — Assessment & Plan Note (Signed)
 Ongoing problem for many years.  Will give short course of steroids to help with worsened pain at this time.  Recommend following up with PCP for ongoing management.

## 2024-01-04 NOTE — Assessment & Plan Note (Signed)
 Chronic. Not well controlled.  Referral placed for Neurology for further evaluation.  Recommend following up with PCP in 2 weeks.

## 2024-02-08 ENCOUNTER — Ambulatory Visit
Admission: RE | Admit: 2024-02-08 | Discharge: 2024-02-08 | Disposition: A | Discharge status: Home / Self Care | Source: Ambulatory Visit | Attending: Family Medicine

## 2024-02-08 ENCOUNTER — Ambulatory Visit (INDEPENDENT_AMBULATORY_CARE_PROVIDER_SITE_OTHER): Admitting: Family Medicine

## 2024-02-08 ENCOUNTER — Encounter: Payer: Self-pay | Admitting: Family Medicine

## 2024-02-08 ENCOUNTER — Ambulatory Visit
Admission: RE | Admit: 2024-02-08 | Discharge: 2024-02-08 | Disposition: A | Discharge status: Home / Self Care | Attending: Family Medicine | Admitting: Family Medicine

## 2024-02-08 VITALS — BP 109/77 | HR 78 | Temp 98.7°F | Ht 64.5 in | Wt 228.6 lb

## 2024-02-08 DIAGNOSIS — Z0000 Encounter for general adult medical examination without abnormal findings: Secondary | ICD-10-CM | POA: Diagnosis not present

## 2024-02-08 DIAGNOSIS — G8929 Other chronic pain: Secondary | ICD-10-CM | POA: Insufficient documentation

## 2024-02-08 DIAGNOSIS — N939 Abnormal uterine and vaginal bleeding, unspecified: Secondary | ICD-10-CM

## 2024-02-08 DIAGNOSIS — M5441 Lumbago with sciatica, right side: Secondary | ICD-10-CM

## 2024-02-08 LAB — WET PREP FOR TRICH, YEAST, CLUE
Clue Cell Exam: POSITIVE — AB
Trichomonas Exam: NEGATIVE
Yeast Exam: POSITIVE — AB

## 2024-02-08 LAB — BAYER DCA HB A1C WAIVED: HB A1C (BAYER DCA - WAIVED): 5.5 % (ref 4.8–5.6)

## 2024-02-08 NOTE — Progress Notes (Signed)
 BP 109/77 (BP Location: Left Arm, Cuff Size: Large)   Pulse 78   Temp 98.7 F (37.1 C) (Oral)   Ht 5' 4.5" (1.638 m)   Wt 228 lb 9.6 oz (103.7 kg)   SpO2 98%   BMI 38.63 kg/m    Subjective:    Patient ID: Andrea Ellis, female    DOB: October 20, 1986, 38 y.o.   MRN: [id number]  HPI: Andrea Ellis is a 38 y.o. female presenting on 02/08/2024 for comprehensive medical examination. Current medical complaints include:  BACK PAIN Duration:  Chronic Mechanism of injury: unknown Location: Right and low back Onset: gradual Severity:  Quality: bothersome Frequency: constant Radiation: down R leg and up into her back Aggravating factors:  bending over Alleviating factors: massage, stretches Status: worse Treatments attempted:  chiroractry, rest, ice, heat, APAP, ibuprofen, and aleve  Relief with NSAIDs?: mild Nighttime pain:  yes Paresthesias / decreased sensation:  no Bowel / bladder incontinence:  no Fevers:  no Dysuria / urinary frequency:  no  She currently lives with: husband and kids Menopausal Symptoms: no  Depression Screen done today and results listed below:     02/08/2024    9:55 AM 01/03/2024    4:12 PM 02/10/2022    9:38 AM 02/02/2021   10:36 AM 02/02/2021    9:59 AM  Depression screen PHQ 2/9  Decreased Interest 0 2 2 1 1   Down, Depressed, Hopeless 0 3 2 1 1   PHQ - 2 Score 0 5 4 2 2   Altered sleeping 1 2 0 0 0  Tired, decreased energy 1 3 2 2 2   Change in appetite 0 0 0 1 1  Feeling bad or failure about yourself  0 1 1 1 1   Trouble concentrating 0 2 0 0 0  Moving slowly or fidgety/restless 0 0 0 0 0  Suicidal thoughts 0 0 0 0 0  PHQ-9 Score 2 13 7 6 6   Difficult doing work/chores Somewhat difficult  Not difficult at all  Not difficult at all    Past Medical History:  Past Medical History:  Diagnosis Date   Headache     Surgical History:  Past Surgical History:  Procedure Laterality Date   APPENDECTOMY     KNEE SURGERY     LAPAROSCOPIC  APPENDECTOMY N/A 08/22/2016   Procedure: APPENDECTOMY LAPAROSCOPIC;  Surgeon: Henrene Dodge, MD;  Location: [facility] OR[facility];  Service: General;  Laterality: N/A;    Medications:  No current outpatient medications on file prior to visit.   No current facility-administered medications on file prior to visit.    Allergies:  No Known Allergies  Social History:  Social History   Socioeconomic History   Marital status: Married    Spouse name: Not on file   Number of children: 1   Years of education: Not on file   Highest education level: GED or equivalent  Occupational History   Occupation: manufactoring  Tobacco Use   Smoking status: Never   Smokeless tobacco: Never  Vaping Use   Vaping status: Not on file  Substance and Sexual Activity   Alcohol use: No    Alcohol/week: 0.0 standard drinks of alcohol   Drug use: No   Sexual activity: Yes    Partners: Male    Birth control/protection: None  Other Topics Concern   Not on file  Social History Narrative   ** Merged History Encounter **       Social Drivers of Corporate investment banker Strain: High  Risk (02/08/2024)   Overall Financial Resource Strain (CARDIA)    Difficulty of Paying Living Expenses: Hard  Food Insecurity: Food Insecurity Present (02/08/2024)   Hunger Vital Sign    Worried About Running Out of Food in the Last Year: Sometimes true    Ran Out of Food in the Last Year: Never true  Transportation Needs: No Transportation Needs (01/03/2024)   PRAPARE - Administrator, Civil Service (Medical): No    Lack of Transportation (Non-Medical): No  Physical Activity: Inactive (02/08/2024)   Exercise Vital Sign    Days of Exercise per Week: 0 days    Minutes of Exercise per Session: 0 min  Stress: Stress Concern Present (01/03/2024)   [employer] of Occupational Health - Occupational Stress Questionnaire    Feeling of Stress : Rather much  Social Connections: Moderately Integrated (02/08/2024)   Social  Connection and Isolation Panel [NHANES]    Frequency of Communication with Friends and Family: Once a week    Frequency of Social Gatherings with Friends and Family: Once a week    Attends Religious Services: More than 4 times per year    Active Member of [employer] or Organizations: Yes    Attends Engineer, structural: More than 4 times per year    Marital Status: Married  Catering manager Violence: Not At Risk (02/08/2024)   Humiliation, Afraid, Rape, and Kick questionnaire    Fear of Current or Ex-Partner: No    Emotionally Abused: No    Physically Abused: No    Sexually Abused: No   Social History   Tobacco Use  Smoking Status Never  Smokeless Tobacco Never   Social History   Substance and Sexual Activity  Alcohol Use No   Alcohol/week: 0.0 standard drinks of alcohol    Family History:  Family History  Problem Relation Age of Onset   Lung disease Mother    Alcohol abuse Father     Past medical history, surgical history, medications, allergies, family history and social history reviewed with patient today and changes made to appropriate areas of the chart.   Review of Systems  Constitutional: Negative.   HENT: Negative.         + TMJ  Eyes: Negative.   Respiratory: Negative.    Cardiovascular: Negative.   Gastrointestinal:  Positive for heartburn. Negative for abdominal pain, blood in stool, constipation, diarrhea, melena, nausea and vomiting.  Genitourinary:  Positive for frequency. Negative for dysuria, flank pain, hematuria and urgency.       +Vaginal bleeding  Musculoskeletal:  Positive for back pain and myalgias. Negative for falls, joint pain and neck pain.  Skin: Negative.   Neurological: Negative.   Endo/Heme/Allergies: Negative.   Psychiatric/Behavioral: Negative.     All other ROS negative except what is listed above and in the HPI.      Objective:    BP 109/77 (BP Location: Left Arm, Cuff Size: Large)   Pulse 78   Temp 98.7 F (37.1 C)  (Oral)   Ht 5' 4.5" (1.638 m)   Wt 228 lb 9.6 oz (103.7 kg)   SpO2 98%   BMI 38.63 kg/m   Wt Readings from Last 3 Encounters:  02/08/24 228 lb 9.6 oz (103.7 kg)  01/03/24 233 lb 6.4 oz (105.9 kg)  05/05/23 240 lb (108.9 kg)    Physical Exam Vitals and nursing note reviewed.  Constitutional:      General: She is not in acute distress.    Appearance:  Normal appearance. She is not ill-appearing, toxic-appearing or diaphoretic.  HENT:     Head: Normocephalic and atraumatic.     Right Ear: Tympanic membrane, ear canal and external ear normal. There is no impacted cerumen.     Left Ear: Tympanic membrane, ear canal and external ear normal. There is no impacted cerumen.     Nose: Nose normal. No congestion or rhinorrhea.     Mouth/Throat:     Mouth: Mucous membranes are moist.     Pharynx: Oropharynx is clear. No oropharyngeal exudate or posterior oropharyngeal erythema.  Eyes:     General: No scleral icterus.       Right eye: No discharge.        Left eye: No discharge.     Extraocular Movements: Extraocular movements intact.     Conjunctiva/sclera: Conjunctivae normal.     Pupils: Pupils are equal, round, and reactive to light.  Neck:     Vascular: No carotid bruit.  Cardiovascular:     Rate and Rhythm: Normal rate and regular rhythm.     Pulses: Normal pulses.     Heart sounds: No murmur heard.    No friction rub. No gallop.  Pulmonary:     Effort: Pulmonary effort is normal. No respiratory distress.     Breath sounds: Normal breath sounds. No stridor. No wheezing, rhonchi or rales.  Chest:     Chest wall: No tenderness.  Abdominal:     General: Abdomen is flat. Bowel sounds are normal. There is no distension.     Palpations: Abdomen is soft. There is no mass.     Tenderness: There is no abdominal tenderness. There is no right CVA tenderness, left CVA tenderness, guarding or rebound.     Hernia: No hernia is present.  Genitourinary:    Comments: Breast and pelvic exams  deferred with shared decision making Musculoskeletal:        General: No swelling, tenderness, deformity or signs of injury.     Cervical back: Normal range of motion and neck supple. No rigidity. No muscular tenderness.     Right lower leg: No edema.     Left lower leg: No edema.  Lymphadenopathy:     Cervical: No cervical adenopathy.  Skin:    General: Skin is warm and dry.     Capillary Refill: Capillary refill takes less than 2 seconds.     Coloration: Skin is not jaundiced or pale.     Findings: No bruising, erythema, lesion or rash.  Neurological:     General: No focal deficit present.     Mental Status: She is alert and oriented to person, place, and time. Mental status is at baseline.     Cranial Nerves: No cranial nerve deficit.     Sensory: No sensory deficit.     Motor: No weakness.     Coordination: Coordination normal.     Gait: Gait normal.     Deep Tendon Reflexes: Reflexes normal.  Psychiatric:        Mood and Affect: Mood normal.        Behavior: Behavior normal.        Thought Content: Thought content normal.        Judgment: Judgment normal.     Results for orders placed or performed in visit on 02/02/21  Cytology - PAP   Collection Time: 02/02/21  9:51 AM  Result Value Ref Range   High risk HPV Negative    Adequacy  Satisfactory for evaluation; transformation zone component PRESENT.   Diagnosis      - Negative for intraepithelial lesion or malignancy (NILM)   Comment Normal Reference Range HPV - Negative   Microscopic Examination   Collection Time: 02/02/21 10:34 AM   Urine  Result Value Ref Range   WBC, UA 0-5 0 - 5 /hpf   RBC, Urine None seen 0 - 2 /hpf   Epithelial Cells (non renal) 0-10 0 - 10 /hpf   Bacteria, UA Few (A) None seen/Few  Urinalysis, Routine w reflex microscopic   Collection Time: 02/02/21 10:34 AM  Result Value Ref Range   Specific Gravity, UA <1.005 (L) 1.005 - 1.030   pH, UA 6.0 5.0 - 7.5   Color, UA Yellow Yellow    Appearance Ur Cloudy (A) Clear   Leukocytes,UA Trace (A) Negative   Protein,UA Negative Negative/Trace   Glucose, UA Negative Negative   Ketones, UA Negative Negative   RBC, UA Trace (A) Negative   Bilirubin, UA Negative Negative   Urobilinogen, Ur 0.2 0.2 - 1.0 mg/dL   Nitrite, UA Negative Negative   Microscopic Examination See below:   CBC with Differential/Platelet   Collection Time: 02/02/21 10:37 AM  Result Value Ref Range   WBC 7.7 3.4 - 10.8 x10E3/uL   RBC 5.22 3.77 - 5.28 x10E6/uL   Hemoglobin 14.9 11.1 - 15.9 g/dL   Hematocrit 95.6 21.3 - 46.6 %   MCV 86 79 - 97 fL   MCH 28.5 26.6 - 33.0 pg   MCHC 33.2 31.5 - 35.7 g/dL   RDW 08.6 57.8 - 46.9 %   Platelets 341 150 - 450 x10E3/uL   Neutrophils 48 Not Estab. %   Lymphs 44 Not Estab. %   Monocytes 6 Not Estab. %   Eos 1 Not Estab. %   Basos 1 Not Estab. %   Neutrophils Absolute 3.7 1.4 - 7.0 x10E3/uL   Lymphocytes Absolute 3.3 (H) 0.7 - 3.1 x10E3/uL   Monocytes Absolute 0.5 0.1 - 0.9 x10E3/uL   EOS (ABSOLUTE) 0.1 0.0 - 0.4 x10E3/uL   Basophils Absolute 0.0 0.0 - 0.2 x10E3/uL   Immature Granulocytes 0 Not Estab. %   Immature Grans (Abs) 0.0 0.0 - 0.1 x10E3/uL  Comprehensive metabolic panel   Collection Time: 02/02/21 10:37 AM  Result Value Ref Range   Glucose 90 65 - 99 mg/dL   BUN 9 6 - 20 mg/dL   Creatinine, Ser 6.29 0.57 - 1.00 mg/dL   eGFR 528 >41 LK/GMW/1.02   BUN/Creatinine Ratio 12 9 - 23   Sodium 139 134 - 144 mmol/L   Potassium 4.2 3.5 - 5.2 mmol/L   Chloride 100 96 - 106 mmol/L   CO2 21 20 - 29 mmol/L   Calcium 9.7 8.7 - 10.2 mg/dL   Total Protein 7.8 6.0 - 8.5 g/dL   Albumin 4.8 3.8 - 4.8 g/dL   Globulin, Total 3.0 1.5 - 4.5 g/dL   Albumin/Globulin Ratio 1.6 1.2 - 2.2   Bilirubin Total 0.5 0.0 - 1.2 mg/dL   Alkaline Phosphatase 57 44 - 121 IU/L   AST 21 0 - 40 IU/L   ALT 28 0 - 32 IU/L  Lipid Panel w/o Chol/HDL Ratio   Collection Time: 02/02/21 10:37 AM  Result Value Ref Range   Cholesterol,  Total 232 (H) 100 - 199 mg/dL   Triglycerides 725 (H) 0 - 149 mg/dL   HDL 30 (L) >36 mg/dL   VLDL Cholesterol Cal 46 (H) 5 -  40 mg/dL   LDL Chol Calc (NIH) 409 (H) 0 - 99 mg/dL  TSH   Collection Time: 02/02/21 10:37 AM  Result Value Ref Range   TSH 3.940 0.450 - 4.500 uIU/mL  Hepatitis C Antibody   Collection Time: 02/02/21 10:37 AM  Result Value Ref Range   Hep C Virus Ab <0.1 0.0 - 0.9 s/co ratio      Assessment & Plan:   Problem List Items Addressed This Visit   None Visit Diagnoses       Routine general medical examination at a health care facility    -  Primary   Vaccines up to date. Screening labs checked today. Pap up to date. Continue diet and exercise. Call with any concerns.   Relevant Orders   CBC with Differential/Platelet   Comprehensive metabolic panel with GFR   Lipid Panel w/o Chol/HDL Ratio   TSH   Bayer DCA Hb A1c Waived     Chronic right-sided low back pain with right-sided sciatica       Chronic and worsening. Would like to avoid medication. Will check x-ray and start PT. Recheck 6 weeks, if not improving consider MRI. Call with concerns.   Relevant Orders   DG Lumbar Spine Complete   Ambulatory referral to Physical Therapy     Vaginal spotting       Will check wet prep and treat as needed.   Relevant Orders   WET PREP FOR TRICH, YEAST, CLUE        Follow up plan: Return in about 6 weeks (around 03/21/2024).   LABORATORY TESTING:  - Pap smear: up to date  IMMUNIZATIONS:   - Tdap: Tetanus vaccination status reviewed: last tetanus booster within 10 years. - Influenza: Postponed to flu season - Pneumovax: Not applicable - Prevnar: Not applicable - COVID: Refused  PATIENT COUNSELING:   Advised to take 1 mg of folate supplement per day if capable of pregnancy.   Sexuality: Discussed sexually transmitted diseases, partner selection, use of condoms, avoidance of unintended pregnancy  and contraceptive alternatives.   Advised to avoid cigarette  smoking.  I discussed with the patient that most people either abstain from alcohol or drink within safe limits (<=14/week and <=4 drinks/occasion for males, <=7/weeks and <= 3 drinks/occasion for females) and that the risk for alcohol disorders and other health effects rises proportionally with the number of drinks per week and how often a drinker exceeds daily limits.  Discussed cessation/primary prevention of drug use and availability of treatment for abuse.   Diet: Encouraged to adjust caloric intake to maintain  or achieve ideal body weight, to reduce intake of dietary saturated fat and total fat, to limit sodium intake by avoiding high sodium foods and not adding table salt, and to maintain adequate dietary potassium and calcium preferably from fresh fruits, vegetables, and low-fat dairy products.    stressed the importance of regular exercise  Injury prevention: Discussed safety belts, safety helmets, smoke detector, smoking near bedding or upholstery.   Dental health: Discussed importance of regular tooth brushing, flossing, and dental visits.    NEXT PREVENTATIVE PHYSICAL DUE IN 1 YEAR. Return in about 6 weeks (around 03/21/2024).

## 2024-02-09 LAB — CBC WITH DIFFERENTIAL/PLATELET
Basophils Absolute: 0 10*3/uL (ref 0.0–0.2)
Basos: 1 %
EOS (ABSOLUTE): 0.1 10*3/uL (ref 0.0–0.4)
Eos: 1 %
Hematocrit: 43.4 % (ref 34.0–46.6)
Hemoglobin: 14.7 g/dL (ref 11.1–15.9)
Immature Grans (Abs): 0 10*3/uL (ref 0.0–0.1)
Immature Granulocytes: 0 %
Lymphocytes Absolute: 2.9 10*3/uL (ref 0.7–3.1)
Lymphs: 36 %
MCH: 29.1 pg (ref 26.6–33.0)
MCHC: 33.9 g/dL (ref 31.5–35.7)
MCV: 86 fL (ref 79–97)
Monocytes Absolute: 0.5 10*3/uL (ref 0.1–0.9)
Monocytes: 6 %
Neutrophils Absolute: 4.6 10*3/uL (ref 1.4–7.0)
Neutrophils: 56 %
Platelets: 380 10*3/uL (ref 150–450)
RBC: 5.05 x10E6/uL (ref 3.77–5.28)
RDW: 13.5 % (ref 11.7–15.4)
WBC: 8.1 10*3/uL (ref 3.4–10.8)

## 2024-02-09 LAB — LIPID PANEL W/O CHOL/HDL RATIO
Cholesterol, Total: 231 mg/dL — ABNORMAL HIGH (ref 100–199)
HDL: 29 mg/dL — ABNORMAL LOW (ref >39)
LDL Chol Calc (NIH): 160 mg/dL — ABNORMAL HIGH (ref 0–99)
Triglycerides: 224 mg/dL — ABNORMAL HIGH (ref 0–149)
VLDL Cholesterol Cal: 42 mg/dL — ABNORMAL HIGH (ref 5–40)

## 2024-02-09 LAB — COMPREHENSIVE METABOLIC PANEL WITH GFR
ALT: 28 IU/L (ref 0–32)
AST: 21 IU/L (ref 0–40)
Albumin: 4.5 g/dL (ref 3.9–4.9)
Alkaline Phosphatase: 57 IU/L (ref 44–121)
BUN/Creatinine Ratio: 12 (ref 9–23)
BUN: 9 mg/dL (ref 6–20)
Bilirubin Total: 0.4 mg/dL (ref 0.0–1.2)
CO2: 20 mmol/L (ref 20–29)
Calcium: 9.8 mg/dL (ref 8.7–10.2)
Chloride: 103 mmol/L (ref 96–106)
Creatinine, Ser: 0.77 mg/dL (ref 0.57–1.00)
Globulin, Total: 2.7 g/dL (ref 1.5–4.5)
Glucose: 92 mg/dL (ref 70–99)
Potassium: 4.3 mmol/L (ref 3.5–5.2)
Sodium: 141 mmol/L (ref 134–144)
Total Protein: 7.2 g/dL (ref 6.0–8.5)
eGFR: 102 mL/min/{1.73_m2} (ref >59)

## 2024-02-09 LAB — TSH: TSH: 3.49 u[IU]/mL (ref 0.450–4.500)

## 2024-02-13 ENCOUNTER — Other Ambulatory Visit: Payer: Self-pay | Admitting: Family Medicine

## 2024-02-13 ENCOUNTER — Encounter: Payer: Self-pay | Admitting: Family Medicine

## 2024-02-13 MED ORDER — FLUCONAZOLE 150 MG PO TABS: ORAL_TABLET | ORAL | 0 refills | Status: DC

## 2024-02-13 MED ORDER — METRONIDAZOLE 500 MG PO TABS: 500.0000 mg | ORAL_TABLET | Freq: Two times a day (BID) | ORAL | 0 refills | Status: AC

## 2024-03-22 ENCOUNTER — Ambulatory Visit: Admitting: Family Medicine

## 2024-03-22 ENCOUNTER — Encounter: Payer: Self-pay | Admitting: Family Medicine

## 2024-03-22 VITALS — BP 92/66 | HR 77 | Temp 97.9°F | Resp 18 | Ht 64.49 in | Wt 231.8 lb

## 2024-03-22 DIAGNOSIS — G8929 Other chronic pain: Secondary | ICD-10-CM

## 2024-03-22 DIAGNOSIS — M5442 Lumbago with sciatica, left side: Secondary | ICD-10-CM

## 2024-03-22 DIAGNOSIS — B379 Candidiasis, unspecified: Secondary | ICD-10-CM

## 2024-03-22 DIAGNOSIS — R350 Frequency of micturition: Secondary | ICD-10-CM | POA: Diagnosis not present

## 2024-03-22 LAB — MICROSCOPIC EXAMINATION: WBC, UA: NONE SEEN /HPF (ref 0–5)

## 2024-03-22 LAB — URINALYSIS, ROUTINE W REFLEX MICROSCOPIC
Bilirubin, UA: NEGATIVE
Glucose, UA: NEGATIVE
Ketones, UA: NEGATIVE
Leukocytes,UA: NEGATIVE
Nitrite, UA: NEGATIVE
Protein,UA: NEGATIVE
Specific Gravity, UA: <1.005 — ABNORMAL LOW (ref 1.005–1.030)
Urobilinogen, Ur: 0.2 mg/dL (ref 0.2–1.0)
pH, UA: 6.5 (ref 5.0–7.5)

## 2024-03-22 LAB — WET PREP FOR TRICH, YEAST, CLUE
Clue Cell Exam: NEGATIVE
Trichomonas Exam: NEGATIVE
Yeast Exam: POSITIVE — AB

## 2024-03-22 MED ORDER — FLUCONAZOLE 150 MG PO TABS: 150.0000 mg | ORAL_TABLET | Freq: Once | ORAL | 0 refills | Status: AC

## 2024-03-22 MED ORDER — CYCLOBENZAPRINE HCL 10 MG PO TABS: 5.0000 mg | ORAL_TABLET | Freq: Every evening | ORAL | 0 refills | Status: AC | PRN

## 2024-03-22 MED ORDER — MELOXICAM 15 MG PO TABS: 15.0000 mg | ORAL_TABLET | Freq: Every day | ORAL | 1 refills | Status: AC | PRN

## 2024-03-22 NOTE — Progress Notes (Signed)
 BP 92/66 (BP Location: Left Arm, Patient Position: Sitting, Cuff Size: Large)   Pulse 77   Temp 97.9 F (36.6 C) (Oral)   Resp 18   Ht 5' 4.49" (1.638 m)   Wt 231 lb 12.8 oz (105.1 kg)   LMP 02/29/2024 (Approximate)   SpO2 98%   BMI 39.19 kg/m    Subjective:    Patient ID: Andrea Ellis, female    DOB: 09/28/1986, 38 y.o.   MRN: [id number]  HPI: Andrea Ellis is a 38 y.o. female  Chief Complaint  Patient presents with   Back Pain    Ongoing for years. Following up about her xray results.    Back is feeling much better. She did not go to PT but has been doing exercises at home. She did not go to see orthopedics either.   URINARY SYMPTOMS Duration: couple of weeks Dysuria: no Urinary frequency: yes Urgency: yes Small volume voids: no Symptom severity: moderate Urinary incontinence: no Foul odor: no Hematuria: no Abdominal pain: no Back pain: yes Suprapubic pain/pressure: no Flank pain: no Fever:  no Vomiting: no Relief with cranberry juice: no Relief with pyridium: no Status: worse Previous urinary tract infection: yes Recurrent urinary tract infection: no Sexual activity: monogomous History of sexually transmitted disease: no Vaginal discharge: no Treatments attempted: increasing fluids    Relevant past medical, surgical, family and social history reviewed and updated as indicated. Interim medical history since our last visit reviewed. Allergies and medications reviewed and updated.  Review of Systems  Constitutional: Negative.   Respiratory: Negative.    Cardiovascular: Negative.   Gastrointestinal: Negative.   Genitourinary:  Positive for frequency and urgency. Negative for decreased urine volume, difficulty urinating, dyspareunia, dysuria, enuresis, flank pain, genital sores, hematuria, menstrual problem, pelvic pain, vaginal bleeding, vaginal discharge and vaginal pain.  Musculoskeletal:  Positive for back pain and myalgias. Negative for  arthralgias, gait problem, joint swelling, neck pain and neck stiffness.  Skin: Negative.   Psychiatric/Behavioral: Negative.      Per HPI unless specifically indicated above     Objective:     BP 92/66 (BP Location: Left Arm, Patient Position: Sitting, Cuff Size: Large)   Pulse 77   Temp 97.9 F (36.6 C) (Oral)   Resp 18   Ht 5' 4.49" (1.638 m)   Wt 231 lb 12.8 oz (105.1 kg)   LMP 02/29/2024 (Approximate)   SpO2 98%   BMI 39.19 kg/m   Wt Readings from Last 3 Encounters:  03/22/24 231 lb 12.8 oz (105.1 kg)  02/08/24 228 lb 9.6 oz (103.7 kg)  01/03/24 233 lb 6.4 oz (105.9 kg)    Physical Exam Vitals and nursing note reviewed.  Constitutional:      General: She is not in acute distress.    Appearance: Normal appearance. She is not ill-appearing, toxic-appearing or diaphoretic.  HENT:     Head: Normocephalic and atraumatic.     Right Ear: External ear normal.     Left Ear: External ear normal.     Nose: Nose normal.     Mouth/Throat:     Mouth: Mucous membranes are moist.     Pharynx: Oropharynx is clear.  Eyes:     General: No scleral icterus.       Right eye: No discharge.        Left eye: No discharge.     Extraocular Movements: Extraocular movements intact.     Conjunctiva/sclera: Conjunctivae normal.     Pupils: Pupils are equal,  round, and reactive to light.  Cardiovascular:     Rate and Rhythm: Normal rate and regular rhythm.     Pulses: Normal pulses.     Heart sounds: Normal heart sounds. No murmur heard.    No friction rub. No gallop.  Pulmonary:     Effort: Pulmonary effort is normal. No respiratory distress.     Breath sounds: Normal breath sounds. No stridor. No wheezing, rhonchi or rales.  Chest:     Chest wall: No tenderness.  Musculoskeletal:        General: Normal range of motion.     Cervical back: Normal range of motion and neck supple.  Skin:    General: Skin is warm and dry.     Capillary Refill: Capillary refill takes less than 2  seconds.     Coloration: Skin is not jaundiced or pale.     Findings: No bruising, erythema, lesion or rash.  Neurological:     General: No focal deficit present.     Mental Status: She is alert and oriented to person, place, and time. Mental status is at baseline.  Psychiatric:        Mood and Affect: Mood normal.        Behavior: Behavior normal.        Thought Content: Thought content normal.        Judgment: Judgment normal.     Results for orders placed or performed in visit on 02/08/24  WET PREP FOR TRICH, YEAST, CLUE   Collection Time: 02/08/24 10:08 AM   Specimen: Blood   BLD  Result Value Ref Range   Trichomonas Exam Negative Negative   Yeast Exam Positive (A) Negative   Clue Cell Exam Positive (A) Negative  Bayer DCA Hb A1c Waived   Collection Time: 02/08/24 10:08 AM  Result Value Ref Range   HB A1C (BAYER DCA - WAIVED) 5.5 4.8 - 5.6 %  CBC with Differential/Platelet   Collection Time: 02/08/24 10:09 AM  Result Value Ref Range   WBC 8.1 3.4 - 10.8 x10E3/uL   RBC 5.05 3.77 - 5.28 x10E6/uL   Hemoglobin 14.7 11.1 - 15.9 g/dL   Hematocrit 40.9 81.1 - 46.6 %   MCV 86 79 - 97 fL   MCH 29.1 26.6 - 33.0 pg   MCHC 33.9 31.5 - 35.7 g/dL   RDW 91.4 78.2 - 95.6 %   Platelets 380 150 - 450 x10E3/uL   Neutrophils 56 Not Estab. %   Lymphs 36 Not Estab. %   Monocytes 6 Not Estab. %   Eos 1 Not Estab. %   Basos 1 Not Estab. %   Neutrophils Absolute 4.6 1.4 - 7.0 x10E3/uL   Lymphocytes Absolute 2.9 0.7 - 3.1 x10E3/uL   Monocytes Absolute 0.5 0.1 - 0.9 x10E3/uL   EOS (ABSOLUTE) 0.1 0.0 - 0.4 x10E3/uL   Basophils Absolute 0.0 0.0 - 0.2 x10E3/uL   Immature Granulocytes 0 Not Estab. %   Immature Grans (Abs) 0.0 0.0 - 0.1 x10E3/uL  Comprehensive metabolic panel with GFR   Collection Time: 02/08/24 10:09 AM  Result Value Ref Range   Glucose 92 70 - 99 mg/dL   BUN 9 6 - 20 mg/dL   Creatinine, Ser 2.13 0.57 - 1.00 mg/dL   eGFR 086 >57 QI/ONG/2.95   BUN/Creatinine Ratio 12 9  - 23   Sodium 141 134 - 144 mmol/L   Potassium 4.3 3.5 - 5.2 mmol/L   Chloride 103 96 - 106 mmol/L  CO2 20 20 - 29 mmol/L   Calcium 9.8 8.7 - 10.2 mg/dL   Total Protein 7.2 6.0 - 8.5 g/dL   Albumin 4.5 3.9 - 4.9 g/dL   Globulin, Total 2.7 1.5 - 4.5 g/dL   Bilirubin Total 0.4 0.0 - 1.2 mg/dL   Alkaline Phosphatase 57 44 - 121 IU/L   AST 21 0 - 40 IU/L   ALT 28 0 - 32 IU/L  Lipid Panel w/o Chol/HDL Ratio   Collection Time: 02/08/24 10:09 AM  Result Value Ref Range   Cholesterol, Total 231 (H) 100 - 199 mg/dL   Triglycerides 161 (H) 0 - 149 mg/dL   HDL 29 (L) >09 mg/dL   VLDL Cholesterol Cal 42 (H) 5 - 40 mg/dL   LDL Chol Calc (NIH) 604 (H) 0 - 99 mg/dL  TSH   Collection Time: 02/08/24 10:09 AM  Result Value Ref Range   TSH 3.490 0.450 - 4.500 uIU/mL      Assessment & Plan:   Problem List Items Addressed This Visit       Nervous and Auditory   Chronic bilateral low back pain with left-sided sciatica   Improving with stretches. Will continue stretches, PRN meloxicam and flexeril . Call if getting worse and will get her into PT or ortho. Call with any concerns.       Relevant Medications   meloxicam (MOBIC) 15 MG tablet   cyclobenzaprine  (FLEXERIL ) 10 MG tablet   Other Visit Diagnoses       Yeast infection    -  Primary   Will treat with diflucan . Call if not getting better or getting worse.   Relevant Medications   fluconazole  (DIFLUCAN ) 150 MG tablet     Urinary frequency       + yeast   Relevant Orders   WET PREP FOR TRICH, YEAST, CLUE   Urinalysis, Routine w reflex microscopic        Follow up plan: Return in about 6 months (around 09/22/2024).

## 2024-03-22 NOTE — Assessment & Plan Note (Signed)
 Improving with stretches. Will continue stretches, PRN meloxicam and flexeril . Call if getting worse and will get her into PT or ortho. Call with any concerns.

## 2024-04-15 NOTE — Addendum Note (Signed)
 Encounter addended by: Tacy Expose, RT on: 04/15/2024 12:32 PM  Actions taken: Imaging Exam begun, Image imported

## 2024-09-23 ENCOUNTER — Ambulatory Visit: Payer: Self-pay | Admitting: Family Medicine
